# Patient Record
Sex: Male | Born: 1960 | Race: White | Hispanic: No | Marital: Married | Smoking: Never smoker
Health system: Southern US, Community
[De-identification: ages and names within clinical notes are randomized; demographics above are authoritative.]

## PROBLEM LIST (undated history)

## (undated) DIAGNOSIS — N200 Calculus of kidney: Secondary | ICD-10-CM

## (undated) DIAGNOSIS — E785 Hyperlipidemia, unspecified: Secondary | ICD-10-CM

## (undated) DIAGNOSIS — Z8614 Personal history of Methicillin resistant Staphylococcus aureus infection: Secondary | ICD-10-CM

## (undated) DIAGNOSIS — I509 Heart failure, unspecified: Secondary | ICD-10-CM

## (undated) DIAGNOSIS — I2510 Atherosclerotic heart disease of native coronary artery without angina pectoris: Secondary | ICD-10-CM

## (undated) DIAGNOSIS — G4730 Sleep apnea, unspecified: Secondary | ICD-10-CM

## (undated) DIAGNOSIS — E119 Type 2 diabetes mellitus without complications: Secondary | ICD-10-CM

## (undated) DIAGNOSIS — Z87442 Personal history of urinary calculi: Secondary | ICD-10-CM

## (undated) DIAGNOSIS — I10 Essential (primary) hypertension: Secondary | ICD-10-CM

## (undated) HISTORY — PX: CORONARY ANGIOPLASTY WITH STENT PLACEMENT: SHX49

## (undated) HISTORY — DX: Heart failure, unspecified: I50.9

---

## 1998-07-15 ENCOUNTER — Ambulatory Visit (HOSPITAL_COMMUNITY): Admission: RE | Admit: 1998-07-15 | Discharge: 1998-07-15 | Payer: Self-pay | Admitting: Obstetrics and Gynecology

## 1998-08-21 ENCOUNTER — Ambulatory Visit (HOSPITAL_COMMUNITY): Admission: RE | Admit: 1998-08-21 | Discharge: 1998-08-21 | Payer: Self-pay | Admitting: Obstetrics and Gynecology

## 2004-10-13 ENCOUNTER — Ambulatory Visit: Payer: Self-pay | Admitting: Unknown Physician Specialty

## 2007-04-17 ENCOUNTER — Emergency Department: Payer: Self-pay

## 2009-08-09 ENCOUNTER — Ambulatory Visit: Payer: Self-pay | Admitting: Rheumatology

## 2009-10-09 ENCOUNTER — Ambulatory Visit: Payer: Self-pay | Admitting: Anesthesiology

## 2009-10-31 ENCOUNTER — Encounter: Payer: Self-pay | Admitting: Anesthesiology

## 2009-11-19 ENCOUNTER — Encounter: Payer: Self-pay | Admitting: Anesthesiology

## 2010-06-30 ENCOUNTER — Ambulatory Visit: Payer: Self-pay | Admitting: Cardiology

## 2010-07-15 ENCOUNTER — Encounter: Payer: Self-pay | Admitting: Cardiology

## 2010-07-22 ENCOUNTER — Encounter: Payer: Self-pay | Admitting: Cardiology

## 2010-08-21 ENCOUNTER — Encounter: Payer: Self-pay | Admitting: Cardiology

## 2010-09-21 ENCOUNTER — Encounter: Payer: Self-pay | Admitting: Cardiology

## 2010-10-22 ENCOUNTER — Encounter: Payer: Self-pay | Admitting: Cardiology

## 2010-11-20 ENCOUNTER — Encounter: Payer: Self-pay | Admitting: Cardiology

## 2011-05-23 ENCOUNTER — Emergency Department: Payer: Self-pay | Admitting: Emergency Medicine

## 2012-05-30 ENCOUNTER — Other Ambulatory Visit: Payer: Self-pay | Admitting: Specialist

## 2012-05-30 DIAGNOSIS — M25512 Pain in left shoulder: Secondary | ICD-10-CM

## 2012-06-04 ENCOUNTER — Ambulatory Visit
Admission: RE | Admit: 2012-06-04 | Discharge: 2012-06-04 | Disposition: A | Discharge status: Home / Self Care | Payer: Worker's Compensation | Source: Ambulatory Visit | Attending: Specialist | Admitting: Specialist

## 2012-06-04 ENCOUNTER — Inpatient Hospital Stay: Admission: RE | Admit: 2012-06-04 | Payer: Self-pay | Source: Ambulatory Visit

## 2012-06-04 DIAGNOSIS — M25512 Pain in left shoulder: Secondary | ICD-10-CM

## 2012-07-14 ENCOUNTER — Ambulatory Visit: Payer: Self-pay | Admitting: Specialist

## 2012-07-28 ENCOUNTER — Ambulatory Visit: Payer: Self-pay | Admitting: Specialist

## 2012-08-24 ENCOUNTER — Ambulatory Visit: Payer: Self-pay | Admitting: Specialist

## 2012-11-12 ENCOUNTER — Observation Stay: Payer: Self-pay | Admitting: Surgery

## 2012-11-12 LAB — PROTIME-INR
INR: 1
Prothrombin Time: 13.5 secs (ref 11.5–14.7)

## 2012-11-12 LAB — MAGNESIUM: Magnesium: 1.5 mg/dL — ABNORMAL LOW

## 2012-11-12 LAB — COMPREHENSIVE METABOLIC PANEL
Albumin: 3.6 g/dL (ref 3.4–5.0)
Alkaline Phosphatase: 80 U/L (ref 50–136)
Calcium, Total: 9 mg/dL (ref 8.5–10.1)
Co2: 25 mmol/L (ref 21–32)
Creatinine: 0.88 mg/dL (ref 0.60–1.30)
Glucose: 276 mg/dL — ABNORMAL HIGH (ref 65–99)
Osmolality: 284 (ref 275–301)
Total Protein: 7 g/dL (ref 6.4–8.2)

## 2012-11-12 LAB — BASIC METABOLIC PANEL
Anion Gap: 7 (ref 7–16)
BUN: 16 mg/dL (ref 7–18)
Calcium, Total: 8.6 mg/dL (ref 8.5–10.1)
Co2: 25 mmol/L (ref 21–32)
Glucose: 223 mg/dL — ABNORMAL HIGH (ref 65–99)

## 2012-11-12 LAB — CBC
HGB: 13.4 g/dL (ref 13.0–18.0)
MCH: 30.5 pg (ref 26.0–34.0)
MCHC: 33.6 g/dL (ref 32.0–36.0)
MCV: 91 fL (ref 80–100)
Platelet: 222 10*3/uL (ref 150–440)
RBC: 4.38 10*6/uL — ABNORMAL LOW (ref 4.40–5.90)

## 2012-11-12 LAB — AMYLASE: Amylase: 29 U/L (ref 25–115)

## 2012-11-12 LAB — LIPASE, BLOOD: Lipase: 143 U/L (ref 73–393)

## 2012-11-13 LAB — COMPREHENSIVE METABOLIC PANEL
Alkaline Phosphatase: 75 U/L (ref 50–136)
BUN: 25 mg/dL — ABNORMAL HIGH (ref 7–18)
Chloride: 101 mmol/L (ref 98–107)
EGFR (African American): >60
EGFR (Non-African Amer.): >60
Potassium: 4.1 mmol/L (ref 3.5–5.1)
SGOT(AST): 51 U/L — ABNORMAL HIGH (ref 15–37)
SGPT (ALT): 53 U/L (ref 12–78)
Sodium: 137 mmol/L (ref 136–145)
Total Protein: 6.6 g/dL (ref 6.4–8.2)

## 2012-11-13 LAB — CBC WITH DIFFERENTIAL/PLATELET
Eosinophil #: 0.1 10*3/uL (ref 0.0–0.7)
Eosinophil %: 1 %
HGB: 12.2 g/dL — ABNORMAL LOW (ref 13.0–18.0)
Lymphocyte #: 2.9 10*3/uL (ref 1.0–3.6)
MCH: 31 pg (ref 26.0–34.0)
MCV: 92 fL (ref 80–100)
Monocyte #: 0.7 x10 3/mm (ref 0.2–1.0)
Monocyte %: 6 %
Neutrophil #: 7.9 10*3/uL — ABNORMAL HIGH (ref 1.4–6.5)
Neutrophil %: 67.5 %
Platelet: 202 10*3/uL (ref 150–440)
RBC: 3.94 10*6/uL — ABNORMAL LOW (ref 4.40–5.90)
RDW: 12.8 % (ref 11.5–14.5)
WBC: 11.8 10*3/uL — ABNORMAL HIGH (ref 3.8–10.6)

## 2012-11-24 ENCOUNTER — Ambulatory Visit: Payer: Self-pay | Admitting: Surgery

## 2013-03-30 ENCOUNTER — Ambulatory Visit: Payer: Self-pay | Admitting: Gastroenterology

## 2015-01-08 NOTE — Op Note (Signed)
PATIENT NAME:  Philip Cook, Philip Cook MR#:  [id number] DATE OF BIRTH:  29-Sep-1960  DATE OF PROCEDURE:  07/28/2012  PREOPERATIVE DIAGNOSIS: Right carpal tunnel syndrome.   POSTOPERATIVE DIAGNOSIS: Right carpal tunnel syndrome.   OPERATION: Right carpal tunnel release.   SURGEON: Park Breed, MD   ANESTHESIA: IV regional anesthesia.   COMPLICATIONS: None.   DRAINS: None.   ESTIMATED BLOOD LOSS: None.   REPLACEMENTS: None.   OPERATIVE PROCEDURE: The patient was brought to the operating room where he underwent satisfactory IV regional anesthesia in the right arm. The arm was prepped and draped in a sterile fashion. The tourniquet time was 37 minutes. A longitudinal incision was made in the palm to the ulnar side of midline. Dissection was carried out sharply through subcutaneous tissue under loupe magnification. Dissection was carried out through the palmar fascia to reveal the distal aspect of the volar carpal ligament. A Kelly clamp was passed beneath this to protect the nerve and the volar ligament was divided under direct vision with a mini blade knife. Carpal tunnel scissors were used to complete this proximally. The nerve was pale and flattened. It was freed up from adhesions with a mosquito clamp. The motor branch was intact. Ulnar artery and nerve were followed proximally and unroofed in the palm. The wounds were then irrigated and closed with running 4-0 nylon suture. 0.5% Marcaine was placed in the wound and a dry sterile compression hand dressing with volar splint was applied.     Tourniquet was deflated with good return of blood flow to the hand without problems. The patient was transferred to a stretcher and taken to recovery in good condition.   ____________________________ Park Breed, MD hem:drc D: 07/28/2012 08:41:20 ET T: 07/28/2012 11:56:58 ET JOB#: [id number]  cc: Park Breed, MD, <Dictator> Park Breed MD ELECTRONICALLY SIGNED 07/28/2012 18:39
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
## 2015-07-25 ENCOUNTER — Ambulatory Visit: Payer: BLUE CROSS/BLUE SHIELD | Attending: Specialist

## 2015-07-25 DIAGNOSIS — G4761 Periodic limb movement disorder: Secondary | ICD-10-CM | POA: Insufficient documentation

## 2015-07-25 DIAGNOSIS — G4733 Obstructive sleep apnea (adult) (pediatric): Secondary | ICD-10-CM | POA: Diagnosis present

## 2017-01-01 ENCOUNTER — Other Ambulatory Visit: Payer: Self-pay | Admitting: Internal Medicine

## 2017-01-01 DIAGNOSIS — R197 Diarrhea, unspecified: Secondary | ICD-10-CM

## 2017-01-14 ENCOUNTER — Ambulatory Visit
Admission: RE | Admit: 2017-01-14 | Discharge: 2017-01-14 | Disposition: A | Discharge status: Home / Self Care | Payer: BC Managed Care – PPO | Source: Ambulatory Visit | Attending: Internal Medicine | Admitting: Internal Medicine

## 2017-01-14 DIAGNOSIS — R197 Diarrhea, unspecified: Secondary | ICD-10-CM | POA: Diagnosis present

## 2017-01-14 DIAGNOSIS — K760 Fatty (change of) liver, not elsewhere classified: Secondary | ICD-10-CM | POA: Diagnosis not present

## 2017-04-05 ENCOUNTER — Encounter: Payer: Self-pay | Admitting: Emergency Medicine

## 2017-04-05 ENCOUNTER — Observation Stay
Admission: EM | Admit: 2017-04-05 | Discharge: 2017-04-07 | Disposition: A | Discharge status: Home / Self Care | Payer: BC Managed Care – PPO | Attending: Internal Medicine | Admitting: Internal Medicine

## 2017-04-05 ENCOUNTER — Emergency Department: Payer: BC Managed Care – PPO

## 2017-04-05 DIAGNOSIS — I2584 Coronary atherosclerosis due to calcified coronary lesion: Secondary | ICD-10-CM | POA: Insufficient documentation

## 2017-04-05 DIAGNOSIS — R609 Edema, unspecified: Secondary | ICD-10-CM

## 2017-04-05 DIAGNOSIS — E785 Hyperlipidemia, unspecified: Secondary | ICD-10-CM | POA: Insufficient documentation

## 2017-04-05 DIAGNOSIS — Z882 Allergy status to sulfonamides status: Secondary | ICD-10-CM | POA: Insufficient documentation

## 2017-04-05 DIAGNOSIS — G4733 Obstructive sleep apnea (adult) (pediatric): Secondary | ICD-10-CM | POA: Insufficient documentation

## 2017-04-05 DIAGNOSIS — I5031 Acute diastolic (congestive) heart failure: Secondary | ICD-10-CM | POA: Insufficient documentation

## 2017-04-05 DIAGNOSIS — I110 Hypertensive heart disease with heart failure: Principal | ICD-10-CM | POA: Insufficient documentation

## 2017-04-05 DIAGNOSIS — M7989 Other specified soft tissue disorders: Secondary | ICD-10-CM | POA: Diagnosis not present

## 2017-04-05 DIAGNOSIS — Z794 Long term (current) use of insulin: Secondary | ICD-10-CM | POA: Diagnosis not present

## 2017-04-05 DIAGNOSIS — Z9641 Presence of insulin pump (external) (internal): Secondary | ICD-10-CM | POA: Diagnosis not present

## 2017-04-05 DIAGNOSIS — R079 Chest pain, unspecified: Secondary | ICD-10-CM | POA: Diagnosis present

## 2017-04-05 DIAGNOSIS — Z8249 Family history of ischemic heart disease and other diseases of the circulatory system: Secondary | ICD-10-CM | POA: Diagnosis not present

## 2017-04-05 DIAGNOSIS — Z955 Presence of coronary angioplasty implant and graft: Secondary | ICD-10-CM | POA: Diagnosis not present

## 2017-04-05 DIAGNOSIS — Z7982 Long term (current) use of aspirin: Secondary | ICD-10-CM | POA: Diagnosis not present

## 2017-04-05 DIAGNOSIS — Z6841 Body Mass Index (BMI) 40.0 and over, adult: Secondary | ICD-10-CM | POA: Insufficient documentation

## 2017-04-05 DIAGNOSIS — I2510 Atherosclerotic heart disease of native coronary artery without angina pectoris: Secondary | ICD-10-CM | POA: Diagnosis not present

## 2017-04-05 HISTORY — DX: Type 2 diabetes mellitus without complications: E11.9

## 2017-04-05 HISTORY — DX: Essential (primary) hypertension: I10

## 2017-04-05 HISTORY — DX: Hyperlipidemia, unspecified: E78.5

## 2017-04-05 HISTORY — DX: Atherosclerotic heart disease of native coronary artery without angina pectoris: I25.10

## 2017-04-05 LAB — COMPREHENSIVE METABOLIC PANEL
ALT: 29 U/L (ref 17–63)
AST: 27 U/L (ref 15–41)
Albumin: 3.9 g/dL (ref 3.5–5.0)
Alkaline Phosphatase: 64 U/L (ref 38–126)
Anion gap: 9 (ref 5–15)
BILIRUBIN TOTAL: 0.6 mg/dL (ref 0.3–1.2)
BUN: 17 mg/dL (ref 6–20)
CO2: 26 mmol/L (ref 22–32)
CREATININE: 0.9 mg/dL (ref 0.61–1.24)
Calcium: 9.3 mg/dL (ref 8.9–10.3)
Chloride: 106 mmol/L (ref 101–111)
Glucose, Bld: 139 mg/dL — ABNORMAL HIGH (ref 65–99)
POTASSIUM: 4.3 mmol/L (ref 3.5–5.1)
Sodium: 141 mmol/L (ref 135–145)
TOTAL PROTEIN: 7.1 g/dL (ref 6.5–8.1)

## 2017-04-05 LAB — CBC
HCT: 36.8 % — ABNORMAL LOW (ref 40.0–52.0)
Hemoglobin: 12.4 g/dL — ABNORMAL LOW (ref 13.0–18.0)
MCH: 29.1 pg (ref 26.0–34.0)
MCHC: 33.7 g/dL (ref 32.0–36.0)
MCV: 86.4 fL (ref 80.0–100.0)
PLATELETS: 200 10*3/uL (ref 150–440)
RBC: 4.26 MIL/uL — AB (ref 4.40–5.90)
RDW: 14.6 % — AB (ref 11.5–14.5)
WBC: 10.7 10*3/uL — ABNORMAL HIGH (ref 3.8–10.6)

## 2017-04-05 LAB — TROPONIN I

## 2017-04-05 LAB — BRAIN NATRIURETIC PEPTIDE: B NATRIURETIC PEPTIDE 5: 92 pg/mL (ref 0.0–100.0)

## 2017-04-05 MED ORDER — ROSUVASTATIN CALCIUM 5 MG PO TABS
5.0000 mg | ORAL_TABLET | Freq: Every day | ORAL | Status: DC
  Administered 2017-04-06 – 2017-04-07 (×2): 5 mg via ORAL
  Filled 2017-04-05 (×2): qty 1

## 2017-04-05 MED ORDER — MELOXICAM 7.5 MG PO TABS
7.5000 mg | ORAL_TABLET | Freq: Every day | ORAL | Status: DC
  Administered 2017-04-05 – 2017-04-06 (×2): 7.5 mg via ORAL
  Filled 2017-04-05 (×3): qty 1

## 2017-04-05 MED ORDER — INSULIN PUMP
Freq: Three times a day (TID) | SUBCUTANEOUS | Status: DC
  Administered 2017-04-05: 22:00:00 via SUBCUTANEOUS
  Filled 2017-04-05: qty 1

## 2017-04-05 MED ORDER — ASPIRIN 81 MG PO CHEW
162.0000 mg | CHEWABLE_TABLET | Freq: Once | ORAL | Status: AC
  Administered 2017-04-05: 162 mg via ORAL
  Filled 2017-04-05: qty 2

## 2017-04-05 MED ORDER — FUROSEMIDE 10 MG/ML IJ SOLN
20.0000 mg | Freq: Two times a day (BID) | INTRAMUSCULAR | Status: DC
  Administered 2017-04-06: 20 mg via INTRAVENOUS
  Filled 2017-04-05: qty 2

## 2017-04-05 MED ORDER — ASPIRIN 325 MG PO TABS
325.0000 mg | ORAL_TABLET | Freq: Every day | ORAL | Status: DC
  Administered 2017-04-06 – 2017-04-07 (×2): 325 mg via ORAL
  Filled 2017-04-05 (×2): qty 1

## 2017-04-05 MED ORDER — PRASUGREL HCL 10 MG PO TABS
10.0000 mg | ORAL_TABLET | Freq: Every day | ORAL | Status: DC
  Administered 2017-04-06 – 2017-04-07 (×2): 10 mg via ORAL
  Filled 2017-04-05 (×2): qty 1

## 2017-04-05 MED ORDER — METFORMIN HCL ER 500 MG PO TB24: 2000.0000 mg | ORAL_TABLET | Freq: Every day | ORAL | Status: DC

## 2017-04-05 MED ORDER — METFORMIN HCL ER 500 MG PO TB24
1000.0000 mg | ORAL_TABLET | Freq: Two times a day (BID) | ORAL | Status: DC
  Administered 2017-04-06 (×2): 1000 mg via ORAL
  Filled 2017-04-05 (×4): qty 2

## 2017-04-05 MED ORDER — FUROSEMIDE 10 MG/ML IJ SOLN
20.0000 mg | Freq: Once | INTRAMUSCULAR | Status: AC
  Administered 2017-04-05: 20 mg via INTRAVENOUS
  Filled 2017-04-05: qty 4

## 2017-04-05 MED ORDER — ENOXAPARIN SODIUM 40 MG/0.4ML ~~LOC~~ SOLN
40.0000 mg | Freq: Two times a day (BID) | SUBCUTANEOUS | Status: DC
  Administered 2017-04-05 – 2017-04-06 (×3): 40 mg via SUBCUTANEOUS
  Filled 2017-04-05 (×3): qty 0.4

## 2017-04-05 MED ORDER — DOCUSATE SODIUM 100 MG PO CAPS: 100.0000 mg | ORAL_CAPSULE | Freq: Two times a day (BID) | ORAL | Status: DC | PRN

## 2017-04-05 MED ORDER — NEBIVOLOL HCL 10 MG PO TABS
10.0000 mg | ORAL_TABLET | Freq: Every day | ORAL | Status: DC
  Administered 2017-04-06 – 2017-04-07 (×2): 10 mg via ORAL
  Filled 2017-04-05 (×2): qty 1

## 2017-04-05 NOTE — H&P (Signed)
Philip Cook NAME: Philip Cook    MR#:  [id number]  DATE OF BIRTH:  15-Sep-1961  DATE OF ADMISSION:  04/05/2017  PRIMARY CARE PHYSICIAN: [name], MD   REQUESTING/REFERRING PHYSICIAN: Rifenbark  CHIEF COMPLAINT:   Chief Complaint  Patient presents with  . Shortness of Breath  . Chest Pain    HISTORY OF PRESENT ILLNESS: Philip Cook  is a 56 y.o. male with a known history of Coronary artery disease status post stent in 2011, diabetes, hyperlipidemia, hypertension- was feeling increasingly short of breath with minimal exertion for last few days. Today also started having some chest tightness just walking 100 steps from the parking lot to his workplace which is very unusual for him. He had to sit down and catch up his breathing, so he got worried about his heart and decided to come to emergency room. He denies any associated pain in arm. He had some swelling on his both legs for last few days. In emergency room he is noted to have some CHF and so given to hospitalist team for further management.  PAST MEDICAL HISTORY:   Past Medical History:  Diagnosis Date  . Coronary artery disease   . Diabetes mellitus without complication ([place])   . Hyperlipidemia   . Hypertension     PAST SURGICAL HISTORY: Past Surgical History:  Procedure Laterality Date  . CORONARY ANGIOPLASTY WITH STENT PLACEMENT      SOCIAL HISTORY:  Social History  Substance Use Topics  . Smoking status: Never Smoker  . Smokeless tobacco: Not on file  . Alcohol use No    FAMILY HISTORY:  Family History  Problem Relation Age of Onset  . CAD Father   . Heart failure Brother     DRUG ALLERGIES:  Allergies  Allergen Reactions  . Sulfa Antibiotics Rash    REVIEW OF SYSTEMS:   CONSTITUTIONAL: No fever, fatigue or weakness.  EYES: No blurred or double vision.  EARS, NOSE, AND THROAT: No tinnitus or ear pain.  RESPIRATORY: No cough,Positive for  shortness of breath, no wheezing or hemoptysis.  CARDIOVASCULAR: No chest pain, orthopnea, edema.  GASTROINTESTINAL: No nausea, vomiting, diarrhea or abdominal pain.  GENITOURINARY: No dysuria, hematuria.  ENDOCRINE: No polyuria, nocturia,  HEMATOLOGY: No anemia, easy bruising or bleeding SKIN: No rash or lesion. MUSCULOSKELETAL: No joint pain or arthritis.   NEUROLOGIC: No tingling, numbness, weakness.  PSYCHIATRY: No anxiety or depression.   MEDICATIONS AT HOME:  Prior to Admission medications   Medication Sig Start Date End Date Taking? Authorizing Provider  aspirin 325 MG tablet Take 325 mg by mouth daily.   Yes [provider]  Insulin Human (INSULIN PUMP) SOLN Inject into the skin.   Yes [provider]  meloxicam (MOBIC) 7.5 MG tablet Take 7.5 mg by mouth daily.   Yes [provider]  metFORMIN (GLUMETZA) 1000 MG (MOD) 24 hr tablet Take 2,000 mg by mouth daily with breakfast.   Yes [provider]  nebivolol (BYSTOLIC) 10 MG tablet Take 10 mg by mouth daily.   Yes [provider]  olmesartan-hydrochlorothiazide (BENICAR HCT) 40-25 MG tablet Take 1 tablet by mouth daily.   Yes [provider]  prasugrel (EFFIENT) 10 MG TABS tablet TAKE ONE TABLET BY MOUTH EVERY DAY 01/04/17  Yes [provider]  rosuvastatin (CRESTOR) 5 MG tablet Take 5 mg by mouth daily.   Yes [provider]      PHYSICAL EXAMINATION:  VITAL SIGNS: Blood pressure (!) 144/70, pulse 73, temperature 99 F (37.2 C), temperature source Oral, resp. rate 20, height 5\' 10"  (1.778 m), weight (!) 147.4 kg (325 lb), SpO2 97 %.  GENERAL:  56 y.o.-year-old obese patient lying in the bed with no acute distress.  EYES: Pupils equal, round, reactive to light and accommodation. No scleral icterus. Extraocular muscles intact.  HEENT: Head atraumatic, normocephalic. Oropharynx and nasopharynx clear.  NECK:  Supple, no jugular venous distention. No thyroid  enlargement, no tenderness.  LUNGS: Normal breath sounds bilaterally, no wheezing, some crepitation. No use of accessory muscles of respiration.  CARDIOVASCULAR: S1, S2 normal. No murmurs, rubs, or gallops.  ABDOMEN: Soft, nontender, nondistended. Bowel sounds present. No organomegaly or mass.  EXTREMITIES: No pedal edema, cyanosis, or clubbing.  NEUROLOGIC: Cranial nerves II through XII are intact. Muscle strength 5/5 in all extremities. Sensation intact. Gait not checked.  PSYCHIATRIC: The patient is alert and oriented x 3.  SKIN: No obvious rash, lesion, or ulcer.   LABORATORY PANEL:   CBC  Recent Labs Lab 04/05/17 1656  WBC 10.7*  HGB 12.4*  HCT 36.8*  PLT 200  MCV 86.4  MCH 29.1  MCHC 33.7  RDW 14.6*   ------------------------------------------------------------------------------------------------------------------  Chemistries   Recent Labs Lab 04/05/17 1656  NA 141  K 4.3  CL 106  CO2 26  GLUCOSE 139*  BUN 17  CREATININE 0.90  CALCIUM 9.3  AST 27  ALT 29  ALKPHOS 64  BILITOT 0.6   ------------------------------------------------------------------------------------------------------------------ estimated creatinine clearance is 136.4 mL/min (by C-G formula based on SCr of 0.9 mg/dL). ------------------------------------------------------------------------------------------------------------------ No results for input(s): TSH, T4TOTAL, T3FREE, THYROIDAB in the last 72 hours.  Invalid input(s): FREET3   Coagulation profile No results for input(s): INR, PROTIME in the last 168 hours. ------------------------------------------------------------------------------------------------------------------- No results for input(s): DDIMER in the last 72 hours. -------------------------------------------------------------------------------------------------------------------  Cardiac Enzymes  Recent Labs Lab 04/05/17 1656  TROPONINI <0.03    ------------------------------------------------------------------------------------------------------------------ Invalid input(s): POCBNP  ---------------------------------------------------------------------------------------------------------------  Urinalysis No results found for: COLORURINE, APPEARANCEUR, LABSPEC, PHURINE, GLUCOSEU, HGBUR, BILIRUBINUR, KETONESUR, PROTEINUR, UROBILINOGEN, NITRITE, LEUKOCYTESUR   RADIOLOGY: Dg Chest 2 View  Result Date: 04/05/2017 CLINICAL DATA:  Chest pressure and shortness of breath for the past week. Lower extremity swelling. EXAM: CHEST  2 VIEW COMPARISON:  11/24/2012. FINDINGS: Stable enlarged cardiac silhouette. Increased prominence of the pulmonary vasculature and interstitial markings. No pleural fluid. 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 IMPRESSION: Mild changes of congestive heart failure with stable cardiomegaly. Electronically Signed   By: Claudie Revering M.D.   On: 04/05/2017 17:37    EKG: Orders placed or performed during the hospital encounter of 04/05/17  . ED EKG within 10 minutes  . ED EKG within 10 minutes  . EKG 12-Lead  . EKG 12-Lead    IMPRESSION AND PLAN:  * Acute diastolic congestive heart failure   IV Lasix, intake and output measurement , fluid restriction, daily weight, follow-up chest x-ray.   He had a stress echo done with EF 55% and no ischemic findings in March 2018.   I will call cardiology consult but not order any further diagnostic workup at this time as recently was done as outpatient.  * Hypertension   Continue home medications except hydrochlorothiazide as he would be on Lasix.  * Diabetes   We will check his blood sugar 4 times daily and not giving any coverage but let him use his insulin pump as he is using at home.  * Hyperlipidemia  Continue rosuvastatin.  * Coronary artery disease   Continue aspirin, beta blocker,prasugrel.  All the records are reviewed and case discussed  with ED provider. Management plans discussed with the patient, family and they are in agreement.  CODE STATUS: full. Code Status History    This patient does not have a recorded code status. Please follow your organizational policy for patients in this situation.       TOTAL TIME TAKING CARE OF THIS PATIENT: 35 minutes.    Vaughan Basta M.D on 04/05/2017   Between 7am to 6pm - Pager - [phone]  After 6pm go to [email or web address] - password [email or web address] [facility]  Office  [phone]  CC: Primary care physician; [name], MD   Note: This dictation was prepared with Dragon dictation along with smaller phrase technology. Any transcriptional errors that result from this process are unintentional.

## 2017-04-05 NOTE — ED Triage Notes (Signed)
Patient is wearing insulin pump.

## 2017-04-05 NOTE — ED Triage Notes (Signed)
States has had SOB and chest pressure x 1 week with decreased exercise tolerance. States has taken on some fluid.

## 2017-04-05 NOTE — ED Provider Notes (Signed)
[facility] Emergency Department Provider Note  ____________________________________________   First MD Initiated Contact with Patient 04/05/17 1742     (approximate)  I have reviewed the triage vital signs and the nursing notes.   HISTORY  Chief Complaint Shortness of Breath and Chest Pain    HPI Philip Cook is a 56 y.o. male who self presents to the emergency department with several weeks of progressive exertional shortness of breath along with moderate severity pressure-like exertional chest discomfort. This chest pain and shortness of breath or improved with rest. He does have a past medical history of diabetes mellitus, hypertension, and morbid obesity.He denies history of DVT or pulmonary embolism. She does have bilateral lower extremity swelling that is equal. He has a previous stent and reports compliance with his medications.   Past Medical History:  Diagnosis Date  . Diabetes mellitus without complication ([place])   . Hypertension     There are no active problems to display for this patient.   Past Surgical History:  Procedure Laterality Date  . CORONARY ANGIOPLASTY WITH STENT PLACEMENT      Prior to Admission medications   Not on File    Allergies Sulfa antibiotics  No family history on file.  Social History Social History  Substance Use Topics  . Smoking status: Never Smoker  . Smokeless tobacco: Not on file  . Alcohol use Not on file    Review of Systems Constitutional: No fever/chills Eyes: No visual changes. ENT: No sore throat. Cardiovascular: Positive chest pain. Respiratory: Positive shortness of breath. Gastrointestinal: No abdominal pain.  No nausea, no vomiting.  No diarrhea.  No constipation. Genitourinary: Negative for dysuria. Musculoskeletal: Negative for back pain. Skin: Negative for rash. Neurological: Negative for headaches, focal weakness or  numbness.   ____________________________________________   PHYSICAL EXAM:  VITAL SIGNS: ED Triage Vitals  Enc Vitals Group     BP 04/05/17 1655 (!) 144/70     Pulse Rate 04/05/17 1655 73     Resp 04/05/17 1655 20     Temp 04/05/17 1655 99 F (37.2 C)     Temp Source 04/05/17 1655 Oral     SpO2 04/05/17 1655 97 %     Weight 04/05/17 1656 (!) 325 lb (147.4 kg)     Height 04/05/17 1656 5\' 10"  (1.778 m)     Head Circumference --      Peak Flow --      Pain Score 04/05/17 1655 6     Pain Loc --      Pain Edu? --      Excl. in [place]? --     Constitutional: Alert and oriented 4 clearly out of breath while sitting up into his chair appears morbidly obese Eyes: PERRL EOMI. Head: Atraumatic. Nose: No congestion/rhinnorhea. Mouth/Throat: No trismus Neck: No stridor.  Able to lie completely flat but with some JVD Cardiovascular: Normal rate, regular rhythm. Grossly normal heart sounds.  Good peripheral circulation. Respiratory: Increased respiratory effort.  No retractions. Lungs CTAB and moving good air Gastrointestinal: Obese soft nontender Musculoskeletal: Legs are equal in size Neurologic:  Normal speech and language. No gross focal neurologic deficits are appreciated. Skin:  Skin is warm, dry and intact. No rash noted. Psychiatric: Mood and affect are normal. Speech and behavior are normal.    ____________________________________________   DIFFERENTIAL includes but not limited to  Acute coronary syndrome, pulmonary embolism, pneumothorax, aortic dissection, Boerhaave syndrome ____________________________________________   LABS (all labs ordered are listed, but  only abnormal results are displayed)  Labs Reviewed  CBC - Abnormal; Notable for the following:       Result Value   WBC 10.7 (*)    RBC 4.26 (*)    Hemoglobin 12.4 (*)    HCT 36.8 (*)    RDW 14.6 (*)    All other components within normal limits  COMPREHENSIVE METABOLIC PANEL - Abnormal; Notable for the  following:    Glucose, Bld 139 (*)    All other components within normal limits  TROPONIN I  BRAIN NATRIURETIC PEPTIDE  TROPONIN I    No signs of acute ischemia 1 __________________________________________  EKG  ED ECG REPORT I, Darel Hong, the attending physician, personally viewed and interpreted this ECG.  Date: 04/05/2017 Rate: 75 Rhythm: normal sinus rhythm QRS Axis: normal Intervals: normal ST/T Wave abnormalities: normal Narrative Interpretation: Incomplete right bundle branch block but otherwise unremarkable  ____________________________________________  RADIOLOGY  Chest x-ray concerning for mild fluid overload secondary to congestive heart failure ____________________________________________   PROCEDURES  Procedure(s) performed: no  Procedures  Critical Care performed: no  Observation:no ____________________________________________   INITIAL IMPRESSION / ASSESSMENT AND PLAN / ED COURSE  Pertinent labs & imaging results that were available during my care of the patient were reviewed by me and considered in my medical decision making (see chart for details).  While the patient's EKG is not a STEMI and his first troponin is negative his story of exertional chest pain and shortness of breath with a known cardiac history is concerning. He has a HEART score of 4 which requires inpatient admission for cardiac rule out.  While he does have worsening BLE swelling, his weight is stable from previous.  Unclear if he is significantly fluid overloaded.   ____________________________________________   FINAL CLINICAL IMPRESSION(S) / ED DIAGNOSES  Final diagnoses:  Chest pain, unspecified type      NEW MEDICATIONS STARTED DURING THIS VISIT:  New Prescriptions   No medications on file     Note:  This document was prepared using Dragon voice recognition software and may include unintentional dictation errors.     Darel Hong, MD 04/06/17  1451

## 2017-04-06 ENCOUNTER — Observation Stay: Payer: BC Managed Care – PPO

## 2017-04-06 LAB — GLUCOSE, CAPILLARY
GLUCOSE-CAPILLARY: 98 mg/dL (ref 65–99)
GLUCOSE-CAPILLARY: 73 mg/dL (ref 65–99)
GLUCOSE-CAPILLARY: 79 mg/dL (ref 65–99)
Glucose-Capillary: 102 mg/dL — ABNORMAL HIGH (ref 65–99)
Glucose-Capillary: 110 mg/dL — ABNORMAL HIGH (ref 65–99)
Glucose-Capillary: 56 mg/dL — ABNORMAL LOW (ref 65–99)
Glucose-Capillary: 91 mg/dL (ref 65–99)

## 2017-04-06 LAB — CBC
HCT: 36.6 % — ABNORMAL LOW (ref 40.0–52.0)
HEMOGLOBIN: 12.3 g/dL — AB (ref 13.0–18.0)
MCH: 29.2 pg (ref 26.0–34.0)
MCHC: 33.5 g/dL (ref 32.0–36.0)
MCV: 87.1 fL (ref 80.0–100.0)
Platelets: 197 10*3/uL (ref 150–440)
RBC: 4.21 MIL/uL — AB (ref 4.40–5.90)
RDW: 14.5 % (ref 11.5–14.5)
WBC: 10.5 10*3/uL (ref 3.8–10.6)

## 2017-04-06 LAB — BASIC METABOLIC PANEL
ANION GAP: 7 (ref 5–15)
BUN: 16 mg/dL (ref 6–20)
CALCIUM: 8.8 mg/dL — AB (ref 8.9–10.3)
CO2: 27 mmol/L (ref 22–32)
Chloride: 107 mmol/L (ref 101–111)
Creatinine, Ser: 0.82 mg/dL (ref 0.61–1.24)
GFR calc non Af Amer: >60 mL/min (ref >60)
Glucose, Bld: 124 mg/dL — ABNORMAL HIGH (ref 65–99)
Potassium: 3.4 mmol/L — ABNORMAL LOW (ref 3.5–5.1)
SODIUM: 141 mmol/L (ref 135–145)

## 2017-04-06 LAB — TROPONIN I: Troponin I: <0.03 ng/mL (ref <0.03)

## 2017-04-06 MED ORDER — ACETAMINOPHEN 325 MG PO TABS
650.0000 mg | ORAL_TABLET | Freq: Four times a day (QID) | ORAL | Status: DC | PRN
  Administered 2017-04-06 – 2017-04-07 (×2): 650 mg via ORAL
  Filled 2017-04-06 (×2): qty 2

## 2017-04-06 MED ORDER — INSULIN PUMP
Freq: Three times a day (TID) | SUBCUTANEOUS | Status: DC
  Administered 2017-04-06: 8.5 via SUBCUTANEOUS
  Administered 2017-04-06: 12.5 via SUBCUTANEOUS
  Administered 2017-04-06: 9.8 via SUBCUTANEOUS
  Filled 2017-04-06: qty 1

## 2017-04-06 MED ORDER — FUROSEMIDE 10 MG/ML IJ SOLN
40.0000 mg | Freq: Two times a day (BID) | INTRAMUSCULAR | Status: DC
  Administered 2017-04-06 – 2017-04-07 (×2): 40 mg via INTRAVENOUS
  Filled 2017-04-06 (×2): qty 4

## 2017-04-06 NOTE — Consult Note (Signed)
Reason for Consult:unstable angina congestive heart failure Referring Physician: Georgie Chard MD primary, Dr Anselm Jungling hospitalist  Philip [name] is an 56 y.o. male.  HPI: patient is a 56 year old morbidly obese white male known coronary disease history of PCI and stent 2011. Patient has history of diabetes hyperlipidemia hypertension obstructive sleep apnea states to have had recent dyspnea on minimal exertion fatigue tiredness and is whether this may be an anginal equivalency came to the hospital for further assessment. Patient recently lost his brother sudden death thought to be a cardiac event.patient's had shortness of breath dyspnea leg edema now presents for further cardiac evaluation.  Past Medical History:  Diagnosis Date  . Coronary artery disease   . Diabetes mellitus without complication ([place])   . Hyperlipidemia   . Hypertension     Past Surgical History:  Procedure Laterality Date  . CORONARY ANGIOPLASTY WITH STENT PLACEMENT      Family History  Problem Relation Age of Onset  . CAD Father   . Heart failure Brother     Social History:  reports that he has never smoked. He has never used smokeless tobacco. He reports that he does not drink alcohol or use drugs.  Allergies:  Allergies  Allergen Reactions  . Sulfa Antibiotics Rash    Medications: I have reviewed the patient's current medications.  Results for orders placed or performed during the hospital encounter of 04/05/17 (from the past 48 hour(s))  CBC     Status: Abnormal   Collection Time: 04/05/17  4:56 PM  Result Value Ref Range   WBC 10.7 (H) 3.8 - 10.6 K/uL   RBC 4.26 (L) 4.40 - 5.90 MIL/uL   Hemoglobin 12.4 (L) 13.0 - 18.0 g/dL   HCT 36.8 (L) 40.0 - 52.0 %   MCV 86.4 80.0 - 100.0 fL   MCH 29.1 26.0 - 34.0 pg   MCHC 33.7 32.0 - 36.0 g/dL   RDW 14.6 (H) 11.5 - 14.5 %   Platelets 200 150 - 440 K/uL  Troponin I     Status: None   Collection Time: 04/05/17  4:56 PM  Result Value Ref Range    Troponin I <0.03 <0.03 ng/mL  Comprehensive metabolic panel     Status: Abnormal   Collection Time: 04/05/17  4:56 PM  Result Value Ref Range   Sodium 141 135 - 145 mmol/L   Potassium 4.3 3.5 - 5.1 mmol/L   Chloride 106 101 - 111 mmol/L   CO2 26 22 - 32 mmol/L   Glucose, Bld 139 (H) 65 - 99 mg/dL   BUN 17 6 - 20 mg/dL   Creatinine, Ser 0.90 0.61 - 1.24 mg/dL   Calcium 9.3 8.9 - 10.3 mg/dL   Total Protein 7.1 6.5 - 8.1 g/dL   Albumin 3.9 3.5 - 5.0 g/dL   AST 27 15 - 41 U/L   ALT 29 17 - 63 U/L   Alkaline Phosphatase 64 38 - 126 U/L   Total Bilirubin 0.6 0.3 - 1.2 mg/dL   GFR calc non Af Amer >60 >60 mL/min   GFR calc Af Amer >60 >60 mL/min    Comment: (NOTE) The eGFR has been calculated using the CKD EPI equation. This calculation has not been validated in all clinical situations. eGFR's persistently <60 mL/min signify possible Chronic Kidney Disease.    Anion gap 9 5 - 15  Brain natriuretic peptide     Status: None   Collection Time: 04/05/17  4:56 PM  Result Value Ref  Range   B Natriuretic Peptide 92.0 0.0 - 100.0 pg/mL  Glucose, capillary     Status: None   Collection Time: 04/05/17  9:11 PM  Result Value Ref Range   Glucose-Capillary 98 65 - 99 mg/dL  Troponin I     Status: None   Collection Time: 04/05/17  9:30 PM  Result Value Ref Range   Troponin I <0.03 <0.03 ng/mL  Troponin I     Status: None   Collection Time: 04/06/17  1:26 AM  Result Value Ref Range   Troponin I <0.03 <0.03 ng/mL  Basic metabolic panel     Status: Abnormal   Collection Time: 04/06/17  1:32 AM  Result Value Ref Range   Sodium 141 135 - 145 mmol/L   Potassium 3.4 (L) 3.5 - 5.1 mmol/L   Chloride 107 101 - 111 mmol/L   CO2 27 22 - 32 mmol/L   Glucose, Bld 124 (H) 65 - 99 mg/dL   BUN 16 6 - 20 mg/dL   Creatinine, Ser 0.82 0.61 - 1.24 mg/dL   Calcium 8.8 (L) 8.9 - 10.3 mg/dL   GFR calc non Af Amer >60 >60 mL/min   GFR calc Af Amer >60 >60 mL/min    Comment: (NOTE) The eGFR has been  calculated using the CKD EPI equation. This calculation has not been validated in all clinical situations. eGFR's persistently <60 mL/min signify possible Chronic Kidney Disease.    Anion gap 7 5 - 15  CBC     Status: Abnormal   Collection Time: 04/06/17  1:32 AM  Result Value Ref Range   WBC 10.5 3.8 - 10.6 K/uL   RBC 4.21 (L) 4.40 - 5.90 MIL/uL   Hemoglobin 12.3 (L) 13.0 - 18.0 g/dL   HCT 36.6 (L) 40.0 - 52.0 %   MCV 87.1 80.0 - 100.0 fL   MCH 29.2 26.0 - 34.0 pg   MCHC 33.5 32.0 - 36.0 g/dL   RDW 14.5 11.5 - 14.5 %   Platelets 197 150 - 440 K/uL  Glucose, capillary     Status: Abnormal   Collection Time: 04/06/17  4:22 AM  Result Value Ref Range   Glucose-Capillary 102 (H) 65 - 99 mg/dL  Glucose, capillary     Status: None   Collection Time: 04/06/17  7:56 AM  Result Value Ref Range   Glucose-Capillary 73 65 - 99 mg/dL  Glucose, capillary     Status: None   Collection Time: 04/06/17 12:13 PM  Result Value Ref Range   Glucose-Capillary 79 65 - 99 mg/dL  Glucose, capillary     Status: Abnormal   Collection Time: 04/06/17  4:42 PM  Result Value Ref Range   Glucose-Capillary 110 (H) 65 - 99 mg/dL    Dg Chest 2 View  Result Date: 04/06/2017 CLINICAL DATA:  Shortness of breath with chest pain EXAM: CHEST  2 VIEW COMPARISON:  April 05, 2017 and November 24, 2012 FINDINGS: There is persistent interstitial edema without airspace consolidation or volume loss. There is an equivocal right pleural effusion. Heart is borderline enlarged with slight pulmonary venous hypertension. No adenopathy. 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 IMPRESSION: Evidence of a degree of congestive heart failure. No airspace consolidation. Stable cardiac silhouette. Electronically Signed   By: Lowella Grip III M.D.   On: 04/06/2017 08:39   Dg Chest 2 View  Result Date: 04/05/2017 CLINICAL DATA:  Chest pressure and shortness of breath for the past week. Lower extremity  swelling. EXAM: CHEST  2 VIEW COMPARISON:  11/24/2012. FINDINGS: Stable enlarged cardiac silhouette. Increased prominence of the pulmonary vasculature and interstitial markings. No pleural fluid. 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 IMPRESSION: Mild changes of congestive heart failure with stable cardiomegaly. Electronically Signed   By: Claudie Revering M.D.   On: 04/05/2017 17:37   US Venous Img Lower Bilateral  Result Date: 04/06/2017 CLINICAL DATA:  Lower extremity edema bilaterally EXAM: BILATERAL LOWER EXTREMITY VENOUS DUPLEX ULTRASOUND TECHNIQUE: Gray-scale sonography with graded compression, as well as color Doppler and duplex ultrasound were performed to evaluate the lower extremity deep venous systems from the level of the common femoral vein and including the common femoral, femoral, profunda femoral, popliteal and calf veins including the posterior tibial, peroneal and gastrocnemius veins when visible. The superficial great saphenous vein was also interrogated. Spectral Doppler was utilized to evaluate flow at rest and with distal augmentation maneuvers in the common femoral, femoral and popliteal veins. COMPARISON:  None. FINDINGS: RIGHT LOWER EXTREMITY Common Femoral Vein: No evidence of thrombus. Normal compressibility, respiratory phasicity and response to augmentation. Saphenofemoral Junction: No evidence of thrombus. Normal compressibility and flow on color Doppler imaging. Profunda Femoral Vein: No evidence of thrombus. Normal compressibility and flow on color Doppler imaging. Femoral Vein: No evidence of thrombus. Normal compressibility, respiratory phasicity and response to augmentation. Popliteal Vein: No evidence of thrombus. Normal compressibility, respiratory phasicity and response to augmentation. Calf Veins: No evidence of thrombus. Normal compressibility and flow on color Doppler imaging. Superficial Great Saphenous Vein: No evidence of thrombus. Normal  compressibility and flow on color Doppler imaging. Venous Reflux:  None. Other Findings:  None. LEFT LOWER EXTREMITY Common Femoral Vein: No evidence of thrombus. Normal compressibility, respiratory phasicity and response to augmentation. Saphenofemoral Junction: No evidence of thrombus. Normal compressibility and flow on color Doppler imaging. Profunda Femoral Vein: No evidence of thrombus. Normal compressibility and flow on color Doppler imaging. Femoral Vein: No evidence of thrombus. Normal compressibility, respiratory phasicity and response to augmentation. Popliteal Vein: No evidence of thrombus. Normal compressibility, respiratory phasicity and response to augmentation. Calf Veins: No evidence of thrombus. Normal compressibility and flow on color Doppler imaging. Superficial Great Saphenous Vein: No evidence of thrombus. Normal compressibility and flow on color Doppler imaging. Venous Reflux:  None. Other Findings:  None. IMPRESSION: No evidence of deep venous thrombosis within either lower extremity. Electronically Signed   By: Lowella Grip III M.D.   On: 04/06/2017 11:58    Review of Systems  Constitutional: Positive for diaphoresis and malaise/fatigue.  HENT: Positive for congestion.   Eyes: Negative.   Respiratory: Positive for shortness of breath.   Cardiovascular: Positive for chest pain and leg swelling.  Gastrointestinal: Negative.   Genitourinary: Negative.   Skin: Negative.   Neurological: Positive for weakness.  Endo/Heme/Allergies: Negative.   Psychiatric/Behavioral: Negative.    Blood pressure 107/63, pulse 64, temperature (!) 97.5 F (36.4 C), temperature source Oral, resp. rate 20, height 5' 10"  (1.778 m), weight (!) 148.4 kg (327 lb 3.2 oz), SpO2 96 %. Physical Exam  Assessment/Plan: Unstable angina Coronary artery disease Obesity History of PCI and stent Shortness of breath Diabetes Hyperlipidemia Congestive heart failure diastolic dysfunction Obstructive  Sleep Apnea . PLAN Agree with admit for rule out for myocarinfarction Continue short-term anticoagulation Follow-up EKGs Follow-up troponins Continue diabetes management Maintain lipid management Continue Effient s/p stent Hypertension control with Benicar HCTZ Bystolic Echocardiogram maybe helpful for Assessment of LV function Diuretic therapy for heart failure Supplemental  oxygen as he had Recommend cardiac catheter prior to discharge .  Philip [name] 04/06/2017, 5:30 PM

## 2017-04-06 NOTE — Progress Notes (Signed)
[place] at [facility]                                                                                                                                                                                  Patient Demographics   Philip [name], is a 56 y.o. male, DOB - [birth year], CBS:[id number]  Admit date - 04/05/2017   Admitting Physician Vaughan Basta, MD  Outpatient Primary MD for the patient is [name], MD   LOS - 0  Subjective: Patient feels better but still has significant amount of swelling in his lower extremity. Breathing improved    Review of Systems:   CONSTITUTIONAL: No documented fever. No fatigue, weakness. No weight gain, no weight loss.  EYES: No blurry or double vision.  ENT: No tinnitus. No postnasal drip. No redness of the oropharynx.  RESPIRATORY: No cough, no wheeze, no hemoptysis. Positive dyspnea.  CARDIOVASCULAR: No chest pain. No orthopnea. No palpitations. No syncope.  GASTROINTESTINAL: No nausea, no vomiting or diarrhea. No abdominal pain. No melena or hematochezia.  GENITOURINARY: No dysuria or hematuria.  ENDOCRINE: No polyuria or nocturia. No heat or cold intolerance.  HEMATOLOGY: No anemia. No bruising. No bleeding.  INTEGUMENTARY: No rashes. No lesions.  MUSCULOSKELETAL: No arthritis. Positive swelling. No gout.  NEUROLOGIC: No numbness, tingling, or ataxia. No seizure-type activity.  PSYCHIATRIC: No anxiety. No insomnia. No ADD.    Vitals:   Vitals:   04/05/17 1830 04/05/17 2109 04/06/17 0420 04/06/17 0752  BP: (!) 166/90 136/67 139/65 107/63  Pulse: 72 70 72 64  Resp: 18 18 16 20   Temp:  98.6 F (37 C) 98 F (36.7 C) (!) 97.5 F (36.4 C)  TempSrc:  Oral Oral Oral  SpO2: 97% 98% 93% 96%  Weight:  (!) 328 lb 4.8 oz (148.9 kg) (!) 327 lb 3.2 oz (148.4 kg)   Height:  5\' 10"  (1.778 m)      Wt Readings from Last 3 Encounters:  04/06/17 (!) 327 lb 3.2 oz (148.4 kg)     Intake/Output Summary  (Last 24 hours) at 04/06/17 1502 Last data filed at 04/06/17 1354  Gross per 24 hour  Intake              600 ml  Output             1500 ml  Net             -900 ml    Physical Exam:   GENERAL: Pleasant-appearing in no apparent distress.  HEAD, EYES, EARS, NOSE AND THROAT: Atraumatic, normocephalic. Extraocular muscles are intact. Pupils equal and reactive to light. Sclerae anicteric. No conjunctival injection. No  oro-pharyngeal erythema.  NECK: Supple. There is no jugular venous distention. No bruits, no lymphadenopathy, no thyromegaly.  HEART: Regular rate and rhythm,. No murmurs, no rubs, no clicks.  LUNGS: Clear to auscultation bilaterally. No rales or rhonchi. No wheezes.  ABDOMEN: Soft, flat, nontender, nondistended. Has good bowel sounds. No hepatosplenomegaly appreciated.  EXTREMITIES: Bilateral lower extremity edema  NEUROLOGIC: The patient is alert, awake, and oriented x3 with no focal motor or sensory deficits appreciated bilaterally.  SKIN: Moist and warm with no rashes appreciated.  Psych: Not anxious, depressed LN: No inguinal LN enlargement    Antibiotics   Anti-infectives    None      Medications   Scheduled Meds: . aspirin  325 mg Oral Daily  . enoxaparin (LOVENOX) injection  40 mg Subcutaneous BID  . furosemide  40 mg Intravenous Q12H  . insulin pump   Subcutaneous TID AC, HS, 0200  . meloxicam  7.5 mg Oral Daily  . metFORMIN  1,000 mg Oral BID WC  . nebivolol  10 mg Oral Daily  . prasugrel  10 mg Oral Daily  . rosuvastatin  5 mg Oral Daily   Continuous Infusions: PRN Meds:.docusate sodium   Data Review:   Micro Results No results found for this or any previous visit (from the past 240 hour(s)).  Radiology Reports Dg Chest 2 View  Result Date: 04/06/2017 CLINICAL DATA:  Shortness of breath with chest pain EXAM: CHEST  2 VIEW COMPARISON:  April 05, 2017 and November 24, 2012 FINDINGS: There is persistent interstitial edema without airspace  consolidation or volume loss. There is an equivocal right pleural effusion. Heart is borderline enlarged with slight pulmonary venous hypertension. No adenopathy. 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 IMPRESSION: Evidence of a degree of congestive heart failure. No airspace consolidation. Stable cardiac silhouette. Electronically Signed   By: Lowella Grip III M.D.   On: 04/06/2017 08:39   Dg Chest 2 View  Result Date: 04/05/2017 CLINICAL DATA:  Chest pressure and shortness of breath for the past week. Lower extremity swelling. EXAM: CHEST  2 VIEW COMPARISON:  11/24/2012. FINDINGS: Stable enlarged cardiac silhouette. Increased prominence of the pulmonary vasculature and interstitial markings. No pleural fluid. 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 IMPRESSION: Mild changes of congestive heart failure with stable cardiomegaly. Electronically Signed   By: Claudie Revering M.D.   On: 04/05/2017 17:37   US Venous Img Lower Bilateral  Result Date: 04/06/2017 CLINICAL DATA:  Lower extremity edema bilaterally EXAM: BILATERAL LOWER EXTREMITY VENOUS DUPLEX ULTRASOUND TECHNIQUE: Gray-scale sonography with graded compression, as well as color Doppler and duplex ultrasound were performed to evaluate the lower extremity deep venous systems from the level of the common femoral vein and including the common femoral, femoral, profunda femoral, popliteal and calf veins including the posterior tibial, peroneal and gastrocnemius veins when visible. The superficial great saphenous vein was also interrogated. Spectral Doppler was utilized to evaluate flow at rest and with distal augmentation maneuvers in the common femoral, femoral and popliteal veins. COMPARISON:  None. FINDINGS: RIGHT LOWER EXTREMITY Common Femoral Vein: No evidence of thrombus. Normal compressibility, respiratory phasicity and response to augmentation. Saphenofemoral Junction: No evidence of thrombus.  Normal compressibility and flow on color Doppler imaging. Profunda Femoral Vein: No evidence of thrombus. Normal compressibility and flow on color Doppler imaging. Femoral Vein: No evidence of thrombus. Normal compressibility, respiratory phasicity and response to augmentation. Popliteal Vein: No evidence of thrombus. Normal compressibility, respiratory phasicity and response  to augmentation. Calf Veins: No evidence of thrombus. Normal compressibility and flow on color Doppler imaging. Superficial Great Saphenous Vein: No evidence of thrombus. Normal compressibility and flow on color Doppler imaging. Venous Reflux:  None. Other Findings:  None. LEFT LOWER EXTREMITY Common Femoral Vein: No evidence of thrombus. Normal compressibility, respiratory phasicity and response to augmentation. Saphenofemoral Junction: No evidence of thrombus. Normal compressibility and flow on color Doppler imaging. Profunda Femoral Vein: No evidence of thrombus. Normal compressibility and flow on color Doppler imaging. Femoral Vein: No evidence of thrombus. Normal compressibility, respiratory phasicity and response to augmentation. Popliteal Vein: No evidence of thrombus. Normal compressibility, respiratory phasicity and response to augmentation. Calf Veins: No evidence of thrombus. Normal compressibility and flow on color Doppler imaging. Superficial Great Saphenous Vein: No evidence of thrombus. Normal compressibility and flow on color Doppler imaging. Venous Reflux:  None. Other Findings:  None. IMPRESSION: No evidence of deep venous thrombosis within either lower extremity. Electronically Signed   By: Lowella Grip III M.D.   On: 04/06/2017 11:58     CBC  Recent Labs Lab 04/05/17 1656 04/06/17 0132  WBC 10.7* 10.5  HGB 12.4* 12.3*  HCT 36.8* 36.6*  PLT 200 197  MCV 86.4 87.1  MCH 29.1 29.2  MCHC 33.7 33.5  RDW 14.6* 14.5    Chemistries   Recent Labs Lab 04/05/17 1656 04/06/17 0132  NA 141 141  K 4.3 3.4*   CL 106 107  CO2 26 27  GLUCOSE 139* 124*  BUN 17 16  CREATININE 0.90 0.82  CALCIUM 9.3 8.8*  AST 27  --   ALT 29  --   ALKPHOS 64  --   BILITOT 0.6  --    ------------------------------------------------------------------------------------------------------------------ estimated creatinine clearance is 150.3 mL/min (by C-G formula based on SCr of 0.82 mg/dL). ------------------------------------------------------------------------------------------------------------------ No results for input(s): HGBA1C in the last 72 hours. ------------------------------------------------------------------------------------------------------------------ No results for input(s): CHOL, HDL, LDLCALC, TRIG, CHOLHDL, LDLDIRECT in the last 72 hours. ------------------------------------------------------------------------------------------------------------------ No results for input(s): TSH, T4TOTAL, T3FREE, THYROIDAB in the last 72 hours.  Invalid input(s): FREET3 ------------------------------------------------------------------------------------------------------------------ No results for input(s): VITAMINB12, FOLATE, FERRITIN, TIBC, IRON, RETICCTPCT in the last 72 hours.  Coagulation profile No results for input(s): INR, PROTIME in the last 168 hours.  No results for input(s): DDIMER in the last 72 hours.  Cardiac Enzymes  Recent Labs Lab 04/05/17 1656 04/05/17 2130 04/06/17 0126  TROPONINI <0.03 <0.03 <0.03   ------------------------------------------------------------------------------------------------------------------ Invalid input(s): POCBNP    Assessment & Plan   * Acute diastolic congestive heart failure Still has significant swelling of the lower extremity continue IV Lasix I will up the dose   * Hypertension   Continue home medications , bystolic  * Diabetes   SSI with close stable  * Hyperlipidemia   Continue rosuvastatin.  * Coronary artery disease    Continue aspirin, beta blocker,prasugrel.     Code Status Orders        Start     Ordered   04/05/17 2053  Full code  Continuous     04/05/17 2052    Code Status History    Date Active Date Inactive Code Status Order ID Comments User Context   This patient has a current code status but no historical code status.           Consults cards  DVT Prophylaxis  Lovenox   Lab Results  Component Value Date   PLT 197 04/06/2017     Time Spent in minutes  52min Greater than 50% of time spent in care coordination and counseling patient regarding the condition and plan of care.   Dustin Flock M.D on 04/06/2017 at 3:02 PM  Between 7am to 6pm - Pager - [phone]  After 6pm go to [email or web address] - password [email or web address] [facility]   Office  [phone]

## 2017-04-06 NOTE — Progress Notes (Signed)
Inpatient Diabetes Program Recommendations  AACE/ADA: New Consensus Statement on Inpatient Glycemic Control (2015)  Target Ranges:  Prepandial:   less than 140 mg/dL      Peak postprandial:   less than 180 mg/dL (1-2 hours)      Critically ill patients:  140 - 180 mg/dL   Lab Results  Component Value Date   GLUCAP 102 (H) 04/06/2017    Review of Glycemic Control  Results for Philip, [name] (MRN [id number]) as of 04/06/2017 07:35  Ref. Range 04/06/2017 04:22  Glucose-Capillary Latest Ref Range: 65 - 99 mg/dL 102 (H)    Diabetes history: Type 2  Outpatient Diabetes medications: From Dr. Joycie Peek note dated 5//1/18.    56 y.o. male seen in follow-up for type 2 diabetes. He has a Medtronic MiniMed 530G insulin pump and an Enlite continuous glucose monitor (CGM). Current Hgb A1c is 7.6%. Diabetes is controlled. He uses Humulin R U-500 insulin. The pump and CGM were downloaded and reviewed.  Basal settings 12 am 0.65 units/hr 5 am 1.4 units/hr  12 pm 0.65 units/hr 5 pm 1.0 units/hr 24-hour basal = 23.3 "syringe units" of U-500 insulin.  Bolus settings IC ratio 12am 1:9.2, 4 pm 1:8 Sensitivity 42 Target 100-110 Active insulin time 5 hours   Average BG checks per day = 3. Average BG over two weeks = 178 on glucometer and 175 on sensor. Average carbs entered into the pump daily = 261 g. Average total daily use of insulin = 57 syringe units of U500 insulin (basal 40%, bolus 60%). Changes site and fills canula q3 days. Average bolus per day = 3.7. Overrides 6% of boluses. Rare hypoglycemia. Now also taking Trulicity for diabetes. Complains of diarrhea with this medication. Tolerated Bydureon better, however it was not covered by insurer.   Current orders for Inpatient glycemic control: insulin pump with U500 insulin, Metformin 1000mg  bid  Inpatient Diabetes Program Recommendations:  Continue insulin pump as ordered.  Insulin contract printed and flow sheet printed but need to be completed  by the patient. He changed his sensor on Friday, July 13th and a new pump site on Sunday July 15th.  He does not have supplies at his bedside- I have asked him to get his family to bring all his pump supplies.  Please advise patient he will need to bring U500 to fill his pump.  Patient has not made any insulin pump setting changes since seeing Dr. Gabriel Carina.   Patient does not take Trulicity (referenced in Dr. Joycie Peek note)  Reviewed insulin protocol with RN Lance-flow sheet for insulin pump needs to be completed q shift.  Gentry Fitz, RN, BA, MHA, CDE Diabetes Coordinator Inpatient Diabetes Program  [phone] (Team Pager) [phone] ([place]) 04/06/2017 8:59 AM

## 2017-04-06 NOTE — Care Management (Addendum)
CM screen due to diagnosis of heart failure which appears to be new diagnosis.  Wearing insulin pump. Independent in all adls, denies issues accessing medical care, obtaining medications or with transportation.  Current with  PCP.  Heart Failure education booklet.  Has been scheduled for Heart Failure Clinic.  Has access to scales.  Is not requiring supplemental 02

## 2017-04-07 ENCOUNTER — Encounter
Admission: EM | Disposition: A | Discharge status: Home / Self Care | Payer: Self-pay | Source: Home / Self Care | Attending: Emergency Medicine

## 2017-04-07 ENCOUNTER — Encounter: Payer: Self-pay | Admitting: Internal Medicine

## 2017-04-07 HISTORY — PX: LEFT HEART CATH AND CORONARY ANGIOGRAPHY: CATH118249

## 2017-04-07 LAB — BASIC METABOLIC PANEL
ANION GAP: 8 (ref 5–15)
BUN: 16 mg/dL (ref 6–20)
CALCIUM: 9 mg/dL (ref 8.9–10.3)
CO2: 28 mmol/L (ref 22–32)
CREATININE: 0.91 mg/dL (ref 0.61–1.24)
Chloride: 106 mmol/L (ref 101–111)
Glucose, Bld: 72 mg/dL (ref 65–99)
Potassium: 3.5 mmol/L (ref 3.5–5.1)
SODIUM: 142 mmol/L (ref 135–145)

## 2017-04-07 LAB — GLUCOSE, CAPILLARY
GLUCOSE-CAPILLARY: 71 mg/dL (ref 65–99)
GLUCOSE-CAPILLARY: 117 mg/dL — AB (ref 65–99)
GLUCOSE-CAPILLARY: 212 mg/dL — AB (ref 65–99)
Glucose-Capillary: 118 mg/dL — ABNORMAL HIGH (ref 65–99)
Glucose-Capillary: 179 mg/dL — ABNORMAL HIGH (ref 65–99)

## 2017-04-07 LAB — CARDIAC CATHETERIZATION: Cath EF Quantitative: 60 %

## 2017-04-07 LAB — HIV ANTIBODY (ROUTINE TESTING W REFLEX): HIV SCREEN 4TH GENERATION: NONREACTIVE

## 2017-04-07 SURGERY — LEFT HEART CATH AND CORONARY ANGIOGRAPHY
Anesthesia: Moderate Sedation

## 2017-04-07 MED ORDER — FUROSEMIDE 20 MG PO TABS: 20.0000 mg | ORAL_TABLET | Freq: Two times a day (BID) | ORAL | 11 refills | Status: DC

## 2017-04-07 MED ORDER — MIDAZOLAM HCL 2 MG/2ML IJ SOLN
INTRAMUSCULAR | Status: DC | PRN
  Administered 2017-04-07: 1 mg via INTRAVENOUS

## 2017-04-07 MED ORDER — MIDAZOLAM HCL 2 MG/2ML IJ SOLN
INTRAMUSCULAR | Status: AC
  Filled 2017-04-07: qty 2

## 2017-04-07 MED ORDER — ACETAMINOPHEN 325 MG PO TABS: 650.0000 mg | ORAL_TABLET | ORAL | Status: DC | PRN

## 2017-04-07 MED ORDER — SODIUM CHLORIDE 0.9% FLUSH
3.0000 mL | Freq: Two times a day (BID) | INTRAVENOUS | Status: DC
Start: 2017-04-08 — End: 2017-04-07

## 2017-04-07 MED ORDER — SODIUM CHLORIDE 0.9% FLUSH: 3.0000 mL | Freq: Two times a day (BID) | INTRAVENOUS | Status: DC

## 2017-04-07 MED ORDER — SODIUM CHLORIDE 0.9 % IV SOLN: 250.0000 mL | INTRAVENOUS | Status: DC | PRN

## 2017-04-07 MED ORDER — ROSUVASTATIN CALCIUM 10 MG PO TABS: 10.0000 mg | ORAL_TABLET | Freq: Every day | ORAL | 0 refills | Status: AC

## 2017-04-07 MED ORDER — ASPIRIN 81 MG PO CHEW: 81.0000 mg | CHEWABLE_TABLET | ORAL | Status: DC

## 2017-04-07 MED ORDER — ROSUVASTATIN CALCIUM 10 MG PO TABS: 10.0000 mg | ORAL_TABLET | Freq: Every day | ORAL | Status: DC

## 2017-04-07 MED ORDER — SODIUM CHLORIDE 0.9 % WEIGHT BASED INFUSION: 1.0000 mL/kg/h | INTRAVENOUS | Status: DC

## 2017-04-07 MED ORDER — ISOSORBIDE MONONITRATE ER 30 MG PO TB24: 30.0000 mg | ORAL_TABLET | Freq: Every day | ORAL | Status: DC

## 2017-04-07 MED ORDER — ONDANSETRON HCL 4 MG/2ML IJ SOLN: 4.0000 mg | Freq: Four times a day (QID) | INTRAMUSCULAR | Status: DC | PRN

## 2017-04-07 MED ORDER — SODIUM CHLORIDE 0.9% FLUSH: 3.0000 mL | INTRAVENOUS | Status: DC | PRN

## 2017-04-07 MED ORDER — INSULIN ASPART 100 UNIT/ML ~~LOC~~ SOLN: 0.0000 [IU] | Freq: Three times a day (TID) | SUBCUTANEOUS | Status: DC

## 2017-04-07 MED ORDER — SODIUM CHLORIDE 0.9 % WEIGHT BASED INFUSION: 3.0000 mL/kg/h | INTRAVENOUS | Status: DC

## 2017-04-07 MED ORDER — FENTANYL CITRATE (PF) 100 MCG/2ML IJ SOLN
INTRAMUSCULAR | Status: DC | PRN
  Administered 2017-04-07: 50 ug via INTRAVENOUS

## 2017-04-07 MED ORDER — SODIUM CHLORIDE 0.9% FLUSH
3.0000 mL | Freq: Two times a day (BID) | INTRAVENOUS | Status: DC
  Administered 2017-04-07: 3 mL via INTRAVENOUS

## 2017-04-07 MED ORDER — HEPARIN (PORCINE) IN NACL 2-0.9 UNIT/ML-% IJ SOLN
INTRAMUSCULAR | Status: AC
  Filled 2017-04-07: qty 500

## 2017-04-07 MED ORDER — FENTANYL CITRATE (PF) 100 MCG/2ML IJ SOLN
INTRAMUSCULAR | Status: AC
  Filled 2017-04-07: qty 2

## 2017-04-07 MED ORDER — SODIUM CHLORIDE 0.9 % WEIGHT BASED INFUSION
1.0000 mL/kg/h | INTRAVENOUS | Status: DC
  Administered 2017-04-07: 1 mL/kg/h via INTRAVENOUS

## 2017-04-07 MED ORDER — ISOSORBIDE MONONITRATE ER 30 MG PO TB24: 30.0000 mg | ORAL_TABLET | Freq: Every day | ORAL | 0 refills | Status: DC

## 2017-04-07 MED ORDER — IOPAMIDOL (ISOVUE-300) INJECTION 61%
INTRAVENOUS | Status: DC | PRN
  Administered 2017-04-07: 120 mL via INTRAVENOUS

## 2017-04-07 MED ORDER — SODIUM CHLORIDE 0.9 % WEIGHT BASED INFUSION
3.0000 mL/kg/h | INTRAVENOUS | Status: DC
  Administered 2017-04-07 (×2): 3 mL/kg/h via INTRAVENOUS

## 2017-04-07 MED ORDER — INSULIN ASPART 100 UNIT/ML ~~LOC~~ SOLN: 0.0000 [IU] | Freq: Every day | SUBCUTANEOUS | Status: DC

## 2017-04-07 SURGICAL SUPPLY — 9 items
CATH 5FR JL4 DIAGNOSTIC (CATHETERS) ×3 IMPLANT
CATH INFINITI 5FR ANG PIGTAIL (CATHETERS) ×3 IMPLANT
CATH INFINITI JR4 5F (CATHETERS) ×3 IMPLANT
DEVICE CLOSURE MYNXGRIP 5F (Vascular Products) ×3 IMPLANT
KIT MANI 3VAL PERCEP (MISCELLANEOUS) ×3 IMPLANT
NEEDLE PERC 18GX7CM (NEEDLE) ×3 IMPLANT
PACK CARDIAC CATH (CUSTOM PROCEDURE TRAY) ×3 IMPLANT
SHEATH AVANTI 5FR X 11CM (SHEATH) ×3 IMPLANT
WIRE EMERALD 3MM-J .035X150CM (WIRE) ×3 IMPLANT

## 2017-04-07 NOTE — Progress Notes (Signed)
Patient discharged via wheelchair and private vehicle. IV removed and catheter intact. All discharge instructions given and patient verbalizes understanding. Tele removed and returned. No prescriptions given to patient No distress noted.   

## 2017-04-07 NOTE — Discharge Summary (Signed)
[place] at [facility], 56 y.o., DOB 1961-04-24, MRN [id number]. Admission date: 04/05/2017 Discharge Date 04/07/2017 Primary MD [name], MD Admitting Physician Vaughan Basta, MD  Admission Diagnosis  Acute diastolic CHF (congestive heart failure) (HCC) [I50.31] Chest pain, unspecified type [R07.9]  Discharge Diagnosis   Principal Problem:   Acute diastolic CHF  Coronary artery disease which is now medically being managed  Morbid obesity Essential hypertension Hyperlipidemia Coronary artery disease       Hospital CourseWilliam Cook  is a 56 y.o. male with a known history of Coronary artery disease status post stent in 2011, diabetes, hyperlipidemia, hypertension- was feeling increasingly short of breath with minimal exertion for last few days. Today also started having some chest tightness patient was noted to have congestive heart. He was admitted and treated with IV diuresis with significant improvement in his symptoms. Due to his chest pain symptoms and previous history of stent he was seen by cardiology who did a cardiac catheterization.  Cardiac catheter showed following  Post Atrio lesion, 50 %stenosed.  Ost 1st Diag lesion, 50 %stenosed.  1st Diag lesion, 70 %stenosed.  Dist LAD lesion, 50 %stenosed.  Mid Cx lesion, 50 %stenosed.  Ost 1st Mrg to 1st Mrg lesion, 50 %stenosed.  The left ventricular systolic function is normal.  LV end diastolic pressure is normal.  The left ventricular ejection fraction is 55-65% by visual estimate.  Mid LAD lesion, 0 %stenosed.  Medical management was recommended for his coronary artery disease. Per cardiology addition of Imdur and increasing his Crestor was recommended.      Consults  cardiology  Significant Tests:  See full reports for all details     Dg Chest 2 View  Result Date: 04/06/2017 CLINICAL DATA:  Shortness of breath with chest pain EXAM: CHEST   2 VIEW COMPARISON:  April 05, 2017 and November 24, 2012 FINDINGS: There is persistent interstitial edema without airspace consolidation or volume loss. There is an equivocal right pleural effusion. Heart is borderline enlarged with slight pulmonary venous hypertension. No adenopathy. 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 IMPRESSION: Evidence of a degree of congestive heart failure. No airspace consolidation. Stable cardiac silhouette. Electronically Signed   By: Philip Cook III M.D.   On: 04/06/2017 08:39   Dg Chest 2 View  Result Date: 04/05/2017 CLINICAL DATA:  Chest pressure and shortness of breath for the past week. Lower extremity swelling. EXAM: CHEST  2 VIEW COMPARISON:  11/24/2012. FINDINGS: Stable enlarged cardiac silhouette. Increased prominence of the pulmonary vasculature and interstitial markings. No pleural fluid. 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 IMPRESSION: Mild changes of congestive heart failure with stable cardiomegaly. Electronically Signed   By: Claudie Revering M.D.   On: 04/05/2017 17:37   US Venous Img Lower Bilateral  Result Date: 04/06/2017 CLINICAL DATA:  Lower extremity edema bilaterally EXAM: BILATERAL LOWER EXTREMITY VENOUS DUPLEX ULTRASOUND TECHNIQUE: Gray-scale sonography with graded compression, as well as color Doppler and duplex ultrasound were performed to evaluate the lower extremity deep venous systems from the level of the common femoral vein and including the common femoral, femoral, profunda femoral, popliteal and calf veins including the posterior tibial, peroneal and gastrocnemius veins when visible. The superficial great saphenous vein was also interrogated. Spectral Doppler was utilized to evaluate flow at rest and with distal augmentation maneuvers in the common femoral, femoral and popliteal veins. COMPARISON:  None. FINDINGS: RIGHT LOWER EXTREMITY Common Femoral  Vein: No evidence of thrombus. Normal  compressibility, respiratory phasicity and response to augmentation. Saphenofemoral Junction: No evidence of thrombus. Normal compressibility and flow on color Doppler imaging. Profunda Femoral Vein: No evidence of thrombus. Normal compressibility and flow on color Doppler imaging. Femoral Vein: No evidence of thrombus. Normal compressibility, respiratory phasicity and response to augmentation. Popliteal Vein: No evidence of thrombus. Normal compressibility, respiratory phasicity and response to augmentation. Calf Veins: No evidence of thrombus. Normal compressibility and flow on color Doppler imaging. Superficial Great Saphenous Vein: No evidence of thrombus. Normal compressibility and flow on color Doppler imaging. Venous Reflux:  None. Other Findings:  None. LEFT LOWER EXTREMITY Common Femoral Vein: No evidence of thrombus. Normal compressibility, respiratory phasicity and response to augmentation. Saphenofemoral Junction: No evidence of thrombus. Normal compressibility and flow on color Doppler imaging. Profunda Femoral Vein: No evidence of thrombus. Normal compressibility and flow on color Doppler imaging. Femoral Vein: No evidence of thrombus. Normal compressibility, respiratory phasicity and response to augmentation. Popliteal Vein: No evidence of thrombus. Normal compressibility, respiratory phasicity and response to augmentation. Calf Veins: No evidence of thrombus. Normal compressibility and flow on color Doppler imaging. Superficial Great Saphenous Vein: No evidence of thrombus. Normal compressibility and flow on color Doppler imaging. Venous Reflux:  None. Other Findings:  None. IMPRESSION: No evidence of deep venous thrombosis within either lower extremity. Electronically Signed   By: Philip Cook III M.D.   On: 04/06/2017 11:58       Today   Subjective:   Philip Cook  patient feels well denies any chest pain or shortness of breath  Objective:   Blood pressure 133/68, pulse 68,  temperature 98.3 F (36.8 C), resp. rate (!) 21, height 5\' 10"  (1.778 m), weight (!) 325 lb 2.1 oz (147.5 kg), SpO2 96 %.  .  Intake/Output Summary (Last 24 hours) at 04/07/17 1518 Last data filed at 04/07/17 1220  Gross per 24 hour  Intake              240 ml  Output             4000 ml  Net            -3760 ml    Exam VITAL SIGNS: Blood pressure 133/68, pulse 68, temperature 98.3 F (36.8 C), resp. rate (!) 21, height 5\' 10"  (1.778 m), weight (!) 325 lb 2.1 oz (147.5 kg), SpO2 96 %.  GENERAL:  56 y.o.-year-old patient lying in the bed with no acute distress.  EYES: Pupils equal, round, reactive to light and accommodation. No scleral icterus. Extraocular muscles intact.  HEENT: Head atraumatic, normocephalic. Oropharynx and nasopharynx clear.  NECK:  Supple, no jugular venous distention. No thyroid enlargement, no tenderness.  LUNGS: Normal breath sounds bilaterally, no wheezing, rales,rhonchi or crepitation. No use of accessory muscles of respiration.  CARDIOVASCULAR: S1, S2 normal. No murmurs, rubs, or gallops.  ABDOMEN: Soft, nontender, nondistended. Bowel sounds present. No organomegaly or mass.  EXTREMITIES: No pedal edema, cyanosis, or clubbing.  NEUROLOGIC: Cranial nerves II through XII are intact. Muscle strength 5/5 in all extremities. Sensation intact. Gait not checked.  PSYCHIATRIC: The patient is alert and oriented x 3.  SKIN: No obvious rash, lesion, or ulcer.   Data Review     CBC w Diff:  Lab Results  Component Value Date   WBC 10.5 04/06/2017   HGB 12.3 (L) 04/06/2017   HGB 12.2 (L) 11/13/2012   HCT 36.6 (L) 04/06/2017   HCT 36.4 (L) 11/13/2012  PLT 197 04/06/2017   PLT 202 11/13/2012   LYMPHOPCT 25.0 11/13/2012   MONOPCT 6.0 11/13/2012   EOSPCT 1.0 11/13/2012   BASOPCT 0.5 11/13/2012   CMP:  Lab Results  Component Value Date   NA 142 04/07/2017   NA 137 11/13/2012   K 3.5 04/07/2017   K 4.1 11/13/2012   CL 106 04/07/2017   CL 101 11/13/2012    CO2 28 04/07/2017   CO2 28 11/13/2012   BUN 16 04/07/2017   BUN 25 (H) 11/13/2012   CREATININE 0.91 04/07/2017   CREATININE 1.05 11/13/2012   PROT 7.1 04/05/2017   PROT 6.6 11/13/2012   ALBUMIN 3.9 04/05/2017   ALBUMIN 3.2 (L) 11/13/2012   BILITOT 0.6 04/05/2017   BILITOT 0.7 11/13/2012   ALKPHOS 64 04/05/2017   ALKPHOS 75 11/13/2012   AST 27 04/05/2017   AST 51 (H) 11/13/2012   ALT 29 04/05/2017   ALT 53 11/13/2012  .  Micro Results No results found for this or any previous visit (from the past 240 hour(s)).      Code Status Orders        Start     Ordered   04/05/17 2053  Full code  Continuous     04/05/17 2052    Code Status History    Date Active Date Inactive Code Status Order ID Comments User Context   This patient has a current code status but no historical code status.          Follow-up Information    Alisa Graff, FNP. Go on 04/19/2017.   Specialty:  Family Medicine Why:  at 9:20am  Contact information: [address] [place] [id number] [phone]        Isaias Cowman, MD Follow up in 1 week(s).   Specialty:  Cardiology Why:  cad,chf Contact information: [facility] [place] [address] [phone]           Discharge Medications   Allergies as of 04/07/2017      Reactions   Sulfa Antibiotics Rash      Medication List    STOP taking these medications   metFORMIN 1000 MG (MOD) 24 hr tablet Commonly known as:  GLUMETZA     TAKE these medications   aspirin 325 MG tablet Take 325 mg by mouth daily.   furosemide 20 MG tablet Commonly known as:  LASIX Take 1 tablet (20 mg total) by mouth 2 (two) times daily.   insulin pump Soln Inject into the skin.   isosorbide mononitrate 30 MG 24 hr tablet Commonly known as:  IMDUR Take 1 tablet (30 mg total) by mouth daily.   meloxicam 7.5 MG tablet Commonly known as:  MOBIC Take 7.5 mg by mouth  daily.   nebivolol 10 MG tablet Commonly known as:  BYSTOLIC Take 10 mg by mouth daily.   olmesartan-hydrochlorothiazide 40-25 MG tablet Commonly known as:  BENICAR HCT Take 1 tablet by mouth daily.   prasugrel 10 MG Tabs tablet Commonly known as:  EFFIENT TAKE ONE TABLET BY MOUTH EVERY DAY   rosuvastatin 10 MG tablet Commonly known as:  CRESTOR Take 1 tablet (10 mg total) by mouth daily. What changed:  medication strength  how much to take          Total Time in preparing paper work, data evaluation and todays exam - 35 minutes  Dustin Flock M.D on 04/07/2017 at 3:18 PM  [facility]  Office  [phone]

## 2017-04-07 NOTE — Progress Notes (Signed)
Inpatient Diabetes Program Recommendations  AACE/ADA: New Consensus Statement on Inpatient Glycemic Control (2015)  Target Ranges:  Prepandial:   less than 140 mg/dL      Peak postprandial:   less than 180 mg/dL (1-2 hours)      Critically ill patients:  140 - 180 mg/dL   Results for Philip Cook, Philip Cook (MRN [id number]) as of 04/07/2017 14:06  Ref. Range 04/06/2017 12:13 04/06/2017 16:42 04/06/2017 20:58 04/06/2017 22:03 04/07/2017 01:48 04/07/2017 06:22 04/07/2017 07:57 04/07/2017 12:01  Glucose-Capillary Latest Ref Range: 65 - 99 mg/dL 79 110 (H) 56 (L) 91 71 118 (H) 117 (H) 212 (H)   Review of Glycemic Control  Diabetes history: DM2 Outpatient Diabetes medications: Medtronic insulin pump with Humulin R U500 (concentrated insulin), Glumetza 2000 mg daily Current orders for Inpatient glycemic control: Medtronic insulin pump with Humulin R U500 (concentrated insulin), Metformin XR 1000 mg BID, Novolog 0-15 units TID with meals, Novolog 0-5 units QHS  Inpatient Diabetes Program Recommendations:  Insulin Pump: Per chart review, note patient turned off insulin pump this morning due to hypoglycemia and being NPO for heart cath today. Patient has recently been ordered Novolog moderate correction scale. Patient is currently in Cath Lab. Called Cath Lab and spoke with Bevely Palmer, RN. Per RN, patient still has insulin pump off. Explained that patient has U500 in insulin pump and that while the pump is off SQ insulin will be needed. Asked that once patient has finished procedure and is alert and able to operate his insulin pump that it be resumed. If insulin pump is resumed after cath, please discontinue Novolog correction scale.  Thanks, Barnie Alderman, RN, MSN, CDE Diabetes Coordinator Inpatient Diabetes Program [phone] (Team Pager from 8am to 5pm)

## 2017-04-07 NOTE — Progress Notes (Signed)
Pt reports that at 0448 he checked his glucose and his sugar was at 56,  Pt did drink 8 OZ of orange juice around 0450. Pt had been NPO but continued using his insulin pump, Pt reports that his insulin pump is set to give him automatic boluses every 3-4 hours. Requested pt to turn off pump for now, Made MD aware of situation. Pt is scheduled for a cardiac Cath today.

## 2017-04-07 NOTE — Progress Notes (Signed)
Hypoglycemic Event  CBG: 56  Treatment: 8 OZ orange juice  Symptoms: Asymptomatic  Follow-up CBG: Time:2203 CBG Result:97 Possible Reasons for Event: Pt uses his own insulin pump  Comments/MD notified:Willis   Lubertha Leite Miguel Dibble

## 2017-04-07 NOTE — Discharge Instructions (Addendum)
Aspirin and Your Heart Aspirin is a medicine that affects the way blood clots. Aspirin can be used to help reduce the risk of blood clots, heart attacks, and other heart-related problems. Should I take aspirin? Your health care provider will help you determine whether it is safe and beneficial for you to take aspirin daily. Taking aspirin daily may be beneficial if you:  Have had a heart attack or chest pain.  Have undergone open heart surgery such as coronary artery bypass surgery (CABG).  Have had coronary angioplasty.  Have experienced a stroke or transient ischemic attack (TIA).  Have peripheral vascular disease (PVD).  Have chronic heart rhythm problems such as atrial fibrillation.  Are there any risks of taking aspirin daily? Daily use of aspirin can increase your risk of side effects. Some of these include:  Bleeding. Bleeding problems can be minor or serious. An example of a minor problem is a cut that does not stop bleeding. An example of a more serious problem is stomach bleeding or bleeding into the brain. Your risk of bleeding is increased if you are also taking non-steroidal anti-inflammatory medicine (NSAIDs).  Increased bruising.  Upset stomach.  An allergic reaction. People who have nasal polyps have an increased risk of developing an aspirin allergy.  What are some guidelines I should follow when taking aspirin?  Take aspirin only as directed by your health care provider. Make sure you understand how much you should take and what form you should take. The two forms of aspirin are: ? Non-enteric-coated. This type of aspirin does not have a coating and is absorbed quickly. Non-enteric-coated aspirin is usually recommended for people with chest pain. This type of aspirin also comes in a chewable form. ? Enteric-coated. This type of aspirin has a special coating that releases the medicine very slowly. Enteric-coated aspirin causes less stomach upset than  non-enteric-coated aspirin. This type of aspirin should not be chewed or crushed.  Drink alcohol in moderation. Drinking alcohol increases your risk of bleeding. When should I seek medical care?  You have unusual bleeding or bruising.  You have stomach pain.  You have an allergic reaction. Symptoms of an allergic reaction include: ? Hives. ? Itchy skin. ? Swelling of the lips, tongue, or face.  You have ringing in your ears. When should I seek immediate medical care?  Your bowel movements are bloody, dark red, or black in color.  You vomit or cough up blood.  You have blood in your urine.  You cough, wheeze, or feel short of breath. If you have any of the following symptoms, this is an emergency. Do not wait to see if the pain will go away. Get medical help at once. Call your local emergency services (911 in the [place] Do not drive yourself to the hospital.  You have severe chest pain, especially if the pain is crushing or pressure-like and spreads to the arms, back, neck, or jaw.  You have stroke-like symptoms, such as: ? Loss of vision. ? Difficulty talking. ? Numbness or weakness on one side of your body. ? Numbness or weakness in your arm or leg. ? Not thinking clearly or feeling confused.  This information is not intended to replace advice given to you by your health care provider. Make sure you discuss any questions you have with your health care provider. Document Released: 08/20/2008 Document Revised: 01/15/2016 Document Reviewed: 12/13/2013 Elsevier Interactive Patient Education  2018 [place].   Heart Failure Heart failure means your heart  has trouble pumping blood. This makes it hard for your body to work well. Heart failure is usually a long-term (chronic) condition. You must take good care of yourself and follow your doctor's treatment plan. Follow these instructions at home:  Take your heart medicine as told by your doctor. ? Do not stop taking medicine  unless your doctor tells you to. ? Do not skip any dose of medicine. ? Refill your medicines before they run out. ? Take other medicines only as told by your doctor or pharmacist.  Stay active if told by your doctor. The elderly and people with severe heart failure should talk with a doctor about physical activity.  Eat heart-healthy foods. Choose foods that are without trans fat and are low in saturated fat, cholesterol, and salt (sodium). This includes fresh or frozen fruits and vegetables, fish, lean meats, fat-free or low-fat dairy foods, whole grains, and high-fiber foods. Lentils and dried peas and beans (legumes) are also good choices.  Limit salt if told by your doctor.  Cook in a healthy way. Roast, grill, broil, bake, poach, steam, or stir-fry foods.  Limit fluids as told by your doctor.  Weigh yourself every morning. Do this after you pee (urinate) and before you eat breakfast. Write down your weight to give to your doctor.  Take your blood pressure and write it down if your doctor tells you to.  Ask your doctor how to check your pulse. Check your pulse as told.  Lose weight if told by your doctor.  Stop smoking or chewing tobacco. Do not use gum or patches that help you quit without your doctor's approval.  Schedule and go to doctor visits as told.  Nonpregnant women should have no more than 1 drink a day. Men should have no more than 2 drinks a day. Talk to your doctor about drinking alcohol.  Stop illegal drug use.  Stay current with shots (immunizations).  Manage your health conditions as told by your doctor.  Learn to manage your stress.  Rest when you are tired.  If it is really hot outside: ? Avoid intense activities. ? Use air conditioning or fans, or get in a cooler place. ? Avoid caffeine and alcohol. ? Wear loose-fitting, lightweight, and light-colored clothing.  If it is really cold outside: ? Avoid intense activities. ? Layer your  clothing. ? Wear mittens or gloves, a hat, and a scarf when going outside. ? Avoid alcohol.  Learn about heart failure and get support as needed.  Get help to maintain or improve your quality of life and your ability to care for yourself as needed. Contact a doctor if:  You gain weight quickly.  You are more short of breath than usual.  You cannot do your normal activities.  You tire easily.  You cough more than normal, especially with activity.  You have any or more puffiness (swelling) in areas such as your hands, feet, ankles, or belly (abdomen).  You cannot sleep because it is hard to breathe.  You feel like your heart is beating fast (palpitations).  You get dizzy or light-headed when you stand up. Get help right away if:  You have trouble breathing.  There is a change in mental status, such as becoming less alert or not being able to focus.  You have chest pain or discomfort.  You faint. This information is not intended to replace advice given to you by your health care provider. Make sure you discuss any questions you have with  your health care provider. Document Released: 06/16/2008 Document Revised: 02/13/2016 Document Reviewed: 10/24/2012 Elsevier Interactive Patient Education  2017 [place].  Femoral Site Care Refer to this sheet in the next few weeks. These instructions provide you with information about caring for yourself after your procedure. Your health care provider may also give you more specific instructions. Your treatment has been planned according to current medical practices, but problems sometimes occur. Call your health care provider if you have any problems or questions after your procedure. What can I expect after the procedure? After your procedure, it is typical to have the following:  Bruising at the site that usually fades within 1-2 weeks.  Blood collecting in the tissue (hematoma) that may be painful to the touch. It should usually  decrease in size and tenderness within 1-2 weeks.  Follow these instructions at home:  Take medicines only as directed by your health care provider.  You may shower 24-48 hours after the procedure or as directed by your health care provider. Remove the bandage (dressing) and gently wash the site with plain soap and water. Pat the area dry with a clean towel. Do not rub the site, because this may cause bleeding.  Do not take baths, swim, or use a hot tub until your health care provider approves.  Check your insertion site every day for redness, swelling, or drainage.  Do not apply powder or lotion to the site.  Limit use of stairs to twice a day for the first 2-3 days or as directed by your health care provider.  Do not squat for the first 2-3 days or as directed by your health care provider.  Do not lift over 10 lb (4.5 kg) for 5 days after your procedure or as directed by your health care provider.  Ask your health care provider when it is okay to: ? Return to work or school. ? Resume usual physical activities or sports. ? Resume sexual activity.  Do not drive home if you are discharged the same day as the procedure. Have someone else drive you.  You may drive 24 hours after the procedure unless otherwise instructed by your health care provider.  Do not operate machinery or power tools for 24 hours after the procedure or as directed by your health care provider.  If your procedure was done as an outpatient procedure, which means that you went home the same day as your procedure, a responsible adult should be with you for the first 24 hours after you arrive home.  Keep all follow-up visits as directed by your health care provider. This is important. Contact a health care provider if:  You have a fever.  You have chills.  You have increased bleeding from the site. Hold pressure on the site. Get help right away if:  You have unusual pain at the site.  You have redness,  warmth, or swelling at the site.  You have drainage (other than a small amount of blood on the dressing) from the site.  The site is bleeding, and the bleeding does not stop after 30 minutes of holding steady pressure on the site.  Your leg or foot becomes pale, cool, tingly, or numb. This information is not intended to replace advice given to you by your health care provider. Make sure you discuss any questions you have with your health care provider. Document Released: 05/11/2014 Document Revised: 02/13/2016 Document Reviewed: 03/27/2014 Elsevier Interactive Patient Education  2018 [place]. Isosorbide Mononitrate extended-release tablets What  is this medicine? ISOSORBIDE MONONITRATE (eye soe SOR bide mon oh NYE trate) is a vasodilator. It relaxes blood vessels, increasing the blood and oxygen supply to your heart. This medicine is used to prevent chest pain caused by angina. It will not help to stop an episode of chest pain. This medicine may be used for other purposes; ask your health care provider or pharmacist if you have questions. COMMON BRAND NAME(S): Imdur, Isotrate ER What should I tell my health care provider before I take this medicine? They need to know if you have any of these conditions: -previous heart attack or heart failure -an unusual or allergic reaction to isosorbide mononitrate, nitrates, other medicines, foods, dyes, or preservatives -pregnant or trying to get pregnant -breast-feeding How should I use this medicine? Take this medicine by mouth with a glass of water. Follow the directions on the prescription label. Do not crush or chew. Take your medicine at regular intervals. Do not take your medicine more often than directed. Do not stop taking this medicine except on the advice of your doctor or health care professional. Talk to your pediatrician regarding the use of this medicine in children. Special care may be needed. Overdosage: If you think you have taken  too much of this medicine contact a poison control center or emergency room at once. NOTE: This medicine is only for you. Do not share this medicine with others. What if I miss a dose? If you miss a dose, take it as soon as you can. If it is almost time for your next dose, take only that dose. Do not take double or extra doses. What may interact with this medicine? Do not take this medicine with any of the following medications: -medicines used to treat erectile dysfunction (ED) like avanafil, sildenafil, tadalafil, and vardenafil -riociguat This medicine may also interact with the following medications: -medicines for high blood pressure -other medicines for angina or heart failure This list may not describe all possible interactions. Give your health care provider a list of all the medicines, herbs, non-prescription drugs, or dietary supplements you use. Also tell them if you smoke, drink alcohol, or use illegal drugs. Some items may interact with your medicine. What should I watch for while using this medicine? Check your heart rate and blood pressure regularly while you are taking this medicine. Ask your doctor or health care professional what your heart rate and blood pressure should be and when you should contact him or her. Tell your doctor or health care professional if you feel your medicine is no longer working. You may get dizzy. Do not drive, use machinery, or do anything that needs mental alertness until you know how this medicine affects you. To reduce the risk of dizzy or fainting spells, do not sit or stand up quickly, especially if you are an older patient. Alcohol can make you more dizzy, and increase flushing and rapid heartbeats. Avoid alcoholic drinks. Do not treat yourself for coughs, colds, or pain while you are taking this medicine without asking your doctor or health care professional for advice. Some ingredients may increase your blood pressure. What side effects may I  notice from receiving this medicine? Side effects that you should report to your doctor or health care professional as soon as possible: -bluish discoloration of lips, fingernails, or palms of hands -irregular heartbeat, palpitations -low blood pressure -nausea, vomiting -persistent headache -unusually weak or tired Side effects that usually do not require medical attention (report to your doctor or  health care professional if they continue or are bothersome): -flushing of the face or neck -rash This list may not describe all possible side effects. Call your doctor for medical advice about side effects. You may report side effects to FDA at 1-800-FDA-1088. Where should I keep my medicine? Keep out of the reach of children. Store between 15 and 30 degrees C (59 and 86 degrees F). Keep container tightly closed. Throw away any unused medicine after the expiration date. NOTE: This sheet is a summary. It may not cover all possible information. If you have questions about this medicine, talk to your doctor, pharmacist, or health care provider.  2018 Elsevier/Gold Standard (2013-07-07 14:48:19) [facility] [place] [facility] [place]:  Low cardiac diet  DISCHARGE CONDITION:  Stable  ACTIVITY:  Activity as tolerated  OXYGEN:  Home Oxygen: No.   Oxygen Delivery: room air  DISCHARGE LOCATION:  home    ADDITIONAL DISCHARGE INSTRUCTION:   If you experience worsening of your admission symptoms, develop shortness of breath, life threatening emergency, suicidal or homicidal thoughts you must seek medical attention immediately by calling 911 or calling your MD immediately  if symptoms less severe.  You Must read complete instructions/literature along with all the possible adverse reactions/side effects for all the Medicines you take and that have been prescribed to you. Take any new Medicines after you have completely understood and accpet all the possible adverse  reactions/side effects.   Please note  You were cared for by a hospitalist during your hospital stay. If you have any questions about your discharge medications or the care you received while you were in the hospital after you are discharged, you can call the unit and asked to speak with the hospitalist on call if the hospitalist that took care of you is not available. Once you are discharged, your primary care physician will handle any further medical issues. Please note that NO REFILLS for any discharge medications will be authorized once you are discharged, as it is imperative that you return to your primary care physician (or establish a relationship with a primary care physician if you do not have one) for your aftercare needs so that they can reassess your need for medications and monitor your lab values.

## 2017-04-07 NOTE — OR Nursing (Signed)
Report called to Caryl Pina, RN, orders reviewed. Report that right groin site is dry intact with no hematoma. Vital signs are stable, pt is having no pain, awake and oriented x4.

## 2017-04-19 ENCOUNTER — Encounter: Payer: Self-pay | Admitting: Family

## 2017-04-19 ENCOUNTER — Ambulatory Visit: Payer: BC Managed Care – PPO | Attending: Family | Admitting: Family

## 2017-04-19 DIAGNOSIS — E785 Hyperlipidemia, unspecified: Secondary | ICD-10-CM | POA: Insufficient documentation

## 2017-04-19 DIAGNOSIS — I5032 Chronic diastolic (congestive) heart failure: Secondary | ICD-10-CM | POA: Diagnosis not present

## 2017-04-19 DIAGNOSIS — I2510 Atherosclerotic heart disease of native coronary artery without angina pectoris: Secondary | ICD-10-CM | POA: Insufficient documentation

## 2017-04-19 DIAGNOSIS — Z8249 Family history of ischemic heart disease and other diseases of the circulatory system: Secondary | ICD-10-CM | POA: Insufficient documentation

## 2017-04-19 DIAGNOSIS — E109 Type 1 diabetes mellitus without complications: Secondary | ICD-10-CM

## 2017-04-19 DIAGNOSIS — I110 Hypertensive heart disease with heart failure: Secondary | ICD-10-CM | POA: Insufficient documentation

## 2017-04-19 DIAGNOSIS — Z7982 Long term (current) use of aspirin: Secondary | ICD-10-CM | POA: Diagnosis not present

## 2017-04-19 DIAGNOSIS — R0602 Shortness of breath: Secondary | ICD-10-CM | POA: Diagnosis not present

## 2017-04-19 DIAGNOSIS — Z9889 Other specified postprocedural states: Secondary | ICD-10-CM | POA: Insufficient documentation

## 2017-04-19 DIAGNOSIS — Z794 Long term (current) use of insulin: Secondary | ICD-10-CM | POA: Diagnosis not present

## 2017-04-19 DIAGNOSIS — E119 Type 2 diabetes mellitus without complications: Secondary | ICD-10-CM | POA: Diagnosis not present

## 2017-04-19 DIAGNOSIS — G4733 Obstructive sleep apnea (adult) (pediatric): Secondary | ICD-10-CM | POA: Diagnosis not present

## 2017-04-19 DIAGNOSIS — I10 Essential (primary) hypertension: Secondary | ICD-10-CM | POA: Insufficient documentation

## 2017-04-19 NOTE — Progress Notes (Signed)
Patient ID: Philip Cook, male    DOB: [birth year], 56 y.o.   MRN: [id number]  HPI  Mr Valentino is a 56 y/o male with a history of HTN, hyperlipidemia, diabetes, CAD, obstructive sleep apnea and chronic heart failure.   Reviewed echo/stress test report from 12/11/16 which showed an EF of 55% along with trivial MR/TR. Normal stress ECG results. Had a cardiac catheterization dene 04/07/17 which showed normal left ventricular function of 60%, patent proximal LAD stent, diagonal 1 and circumflex with moderate disease and RCA relatively free of disease.   Admitted 04/05/17 with chest pain and heart failure. Initially given IV diuretics. Cardiology consult obtained and catheterization done. Medication management was recommended and he was discharged home after 2 days.   He presents today for his initial visit with a chief complaint of mild shortness of breath with moderate exertion. He describes this as being chronic in nature over the last few months. He does feel like his breathing has improved since hospital discharge. He has associated fatigue which is improving and minimal edema. Has been weighing daily.   Past Medical History:  Diagnosis Date  . CHF (congestive heart failure) ([place])   . Coronary artery disease   . Diabetes mellitus without complication ([place])   . Hyperlipidemia   . Hypertension    Past Surgical History:  Procedure Laterality Date  . CORONARY ANGIOPLASTY WITH STENT PLACEMENT    . LEFT HEART CATH AND CORONARY ANGIOGRAPHY N/A 04/07/2017   Procedure: Left Heart Cath and Coronary Angiography and possible PCI;  Surgeon: Yolonda Kida, MD;  Location: [facility];  Service: Cardiovascular;  Laterality: N/A;   Family History  Problem Relation Age of Onset  . CAD Father   . Heart failure Brother    Social History  Substance Use Topics  . Smoking status: Never Smoker  . Smokeless tobacco: Never Used  . Alcohol use No   Allergies  Allergen Reactions  . Sulfa  Antibiotics Rash   Prior to Admission medications   Medication Sig Start Date End Date Taking? Authorizing Provider  aspirin 325 MG tablet Take 325 mg by mouth daily.   Yes [provider]  furosemide (LASIX) 20 MG tablet Take 1 tablet (20 mg total) by mouth 2 (two) times daily. 04/07/17 04/07/18 Yes Dustin Flock, MD  Insulin Human (INSULIN PUMP) SOLN Inject into the skin.   Yes [provider]  isosorbide mononitrate (IMDUR) 30 MG 24 hr tablet Take 1 tablet (30 mg total) by mouth daily. 04/07/17  Yes Dustin Flock, MD  meloxicam (MOBIC) 7.5 MG tablet Take 7.5 mg by mouth daily.   Yes [provider]  nebivolol (BYSTOLIC) 10 MG tablet Take 10 mg by mouth daily.   Yes [provider]  olmesartan-hydrochlorothiazide (BENICAR HCT) 40-25 MG tablet Take 1 tablet by mouth daily.   Yes [provider]  prasugrel (EFFIENT) 10 MG TABS tablet TAKE ONE TABLET BY MOUTH EVERY DAY 01/04/17  Yes [provider]  rosuvastatin (CRESTOR) 10 MG tablet Take 1 tablet (10 mg total) by mouth daily. 04/08/17  Yes Dustin Flock, MD    Review of Systems  Constitutional: Positive for fatigue. Negative for appetite change.  HENT: Negative for congestion, postnasal drip and sore throat.   Eyes: Negative.   Respiratory: Positive for shortness of breath (going up steps). Negative for chest tightness.   Cardiovascular: Positive for leg swelling (improving). Negative for chest pain and palpitations.  Gastrointestinal: Negative for abdominal distention and  abdominal pain.  Endocrine: Negative.   Genitourinary: Negative.   Musculoskeletal: Positive for arthralgias (right shoulder). Negative for back pain.  Skin: Negative.   Allergic/Immunologic: Negative.   Neurological: Negative for dizziness and light-headedness.  Hematological: Negative for adenopathy. Does not bruise/bleed easily.  Psychiatric/Behavioral: Positive for dysphoric mood (wife feels like patient is  depressed). Negative for sleep disturbance (wearing CPAP nightly). The patient is not nervous/anxious.    Vitals:   04/19/17 0915  BP: (!) 106/59  Pulse: 74  Resp: 20  SpO2: 99%  Weight: (!) 320 lb 2 oz (145.2 kg)  Height: 5\' 10"  (1.778 m)   Wt Readings from Last 3 Encounters:  04/19/17 (!) 320 lb 2 oz (145.2 kg)  04/07/17 (!) 325 lb 2.1 oz (147.5 kg)   Lab Results  Component Value Date   CREATININE 0.91 04/07/2017   CREATININE 0.82 04/06/2017   CREATININE 0.90 04/05/2017    Physical Exam  Constitutional: He is oriented to person, place, and time. He appears well-developed and well-nourished.  HENT:  Head: Normocephalic and atraumatic.  Neck: Normal range of motion. Neck supple. No JVD present.  Cardiovascular: Normal rate and regular rhythm.   Pulmonary/Chest: Effort normal. He has no wheezes. He has no rales.  Abdominal: Soft. He exhibits no distension. There is no tenderness.  Musculoskeletal: He exhibits edema (minimal edema in left lower leg). He exhibits no tenderness.  Neurological: He is alert and oriented to person, place, and time.  Skin: Skin is warm and dry.  Psychiatric: He has a normal mood and affect. His behavior is normal. Thought content normal.  Nursing note and vitals reviewed.  Assessment & Plan:  1: Chronic heart failure with preserved ejection fraction- - NYHA class II - euvolemic today - already weighing daily and home weight chart reviewed which shows a few pound weight loss. Instructed to call for an overnight weight gain of >2 pounds or a weekly weight gain of >5 pounds - not adding salt to his food and has been reading food labels. Was eating goldfish until he read the sodium content. Discussed the importance of closely following a 2000mg  sodium diet.  - minimal swelling in his leg and he does wear TED hose  - was drinking 4L of fluids daily until his admission and since discharge he's reduced his intake to 2L. Was drinking a lot of mountain dew  and has switched to caffeine free. - sees cardiologist (Paraschos) later today - discussed [facility] with patient and a brochure was given to him about this  2: DM- - drinking glucerna - glucose this morning was 63 - saw endocrinologist (Solum) 01/29/17  3: HTN- - BP looks good today - saw PCP ([facility]) 01/01/17  4: Obstructive sleep apnea- - wearing CPAP nightly - sleeps in a recliner due to pain in his right shoulder  Patient mentions that his brother died 81 months ago of heart disease and his father died from HF/MI. Patient tearful when talking about this but denies feeling depressed. Making good lifestyle changes so that he doesn't "end up like them". Encouraged him to follow-up with his PCP if he feels like he's getting depressed.   Patient did not bring his medications nor a list. Each medication was verbally reviewed with the patient and he was encouraged to bring the bottles to every visit to confirm accuracy of list.  Return in 1 month or sooner for any questions/problems before then.

## 2017-04-19 NOTE — Patient Instructions (Signed)
Continue weighing daily and call for an overnight weight gain of > 2 pounds or a weekly weight gain of >5 pounds. 

## 2017-05-13 ENCOUNTER — Encounter: Payer: Self-pay | Admitting: *Deleted

## 2017-05-13 ENCOUNTER — Encounter: Payer: BC Managed Care – PPO | Attending: Cardiology | Admitting: *Deleted

## 2017-05-13 VITALS — Ht 69.5 in | Wt 325.7 lb

## 2017-05-13 DIAGNOSIS — Z7982 Long term (current) use of aspirin: Secondary | ICD-10-CM | POA: Insufficient documentation

## 2017-05-13 DIAGNOSIS — Z79899 Other long term (current) drug therapy: Secondary | ICD-10-CM | POA: Insufficient documentation

## 2017-05-13 DIAGNOSIS — I110 Hypertensive heart disease with heart failure: Secondary | ICD-10-CM | POA: Diagnosis not present

## 2017-05-13 DIAGNOSIS — E785 Hyperlipidemia, unspecified: Secondary | ICD-10-CM | POA: Diagnosis not present

## 2017-05-13 DIAGNOSIS — I2510 Atherosclerotic heart disease of native coronary artery without angina pectoris: Secondary | ICD-10-CM | POA: Insufficient documentation

## 2017-05-13 DIAGNOSIS — Z9641 Presence of insulin pump (external) (internal): Secondary | ICD-10-CM | POA: Insufficient documentation

## 2017-05-13 DIAGNOSIS — I5032 Chronic diastolic (congestive) heart failure: Secondary | ICD-10-CM | POA: Diagnosis present

## 2017-05-13 NOTE — Progress Notes (Signed)
Daily Session Note  Patient Details  Name: Philip Cook MRN: [id number] Date of Birth: 11/16/1960 Referring Provider:     Cardiac Rehab from 05/13/2017 in [facility] Cardiac [facility] Pul[facility]  Referring Provider  Paraschos      Encounter Date: 05/13/2017  Check In:     Session Check In - 05/13/17 1310      Check-In   Location [facility]-Cardiac & Pulmonary Rehab   Staff Present Renita Papa, RN BSN;Susanne Bice, RN, BSN, Lance Sell, BA, ACSM CEP, Exercise Physiologist   Supervising physician immediately available to respond to emergencies See telemetry face sheet for immediately available ER MD   Medication changes reported     No   Fall or balance concerns reported    No   Warm-up and Cool-down Performed as group-led instruction   Resistance Training Performed Yes   VAD Patient? No     Pain Assessment   Currently in Pain? No/denies           Exercise Prescription Changes - 05/13/17 1400      Response to Exercise   Blood Pressure (Admit) 136/72   Blood Pressure (Exercise) 154/68   Heart Rate (Admit) 66 bpm   Heart Rate (Exercise) 115 bpm   Heart Rate (Exit) 64 bpm   Oxygen Saturation (Admit) 97 %   Oxygen Saturation (Exercise) 99 %   Rating of Perceived Exertion (Exercise) 11      History  Smoking Status  . Never Smoker  Smokeless Tobacco  . Never Used    Goals Met:  Proper associated with RPD/PD & O2 Sat Exercise tolerated well Personal goals reviewed No report of cardiac concerns or symptoms Strength training completed today  Goals Unmet:  Not Applicable  Comments: Med Review Completed    Dr. Emily Filbert is Medical Director for [place] and [facility].

## 2017-05-13 NOTE — Patient Instructions (Signed)
Patient Instructions  Patient Details  Name: Philip Cook MRN: [id number] Date of Birth: 07-11-1961 Referring Provider:  Isaias Cowman, MD  Below are the personal goals you chose as well as exercise and nutrition goals. Our goal is to help you keep on track towards obtaining and maintaining your goals. We will be discussing your progress on these goals with you throughout the program.  Initial Exercise Prescription:     Initial Exercise Prescription - 05/13/17 1500      Date of Initial Exercise RX and Referring Provider   Date 05/13/17   Referring Provider Paraschos     Treadmill   MPH 2.3   Grade 1   Minutes 15   METs 3     Recumbant Bike   Level 4   RPM 60   Watts 50   Minutes 15   METs 3.17     Recumbant Elliptical   Level 2   RPM 50   Minutes 15   METs 3     REL-XR   Level 3   Speed 50   Minutes 15   METs 3     T5 Nustep   Level 3   SPM 80   Minutes 15   METs 3     Prescription Details   Frequency (times per week) 3   Duration Progress to 30 minutes of continuous aerobic without signs/symptoms of physical distress     Intensity   THRR 40-80% of Max Heartrate 106-146   Ratings of Perceived Exertion 11-13     Resistance Training   Training Prescription Yes   Weight 4   Reps 10-15      Exercise Goals: Frequency: Be able to perform aerobic exercise three times per week working toward 3-5 days per week.  Intensity: Work with a perceived exertion of 11 (fairly light) - 15 (hard) as tolerated. Follow your new exercise prescription and watch for changes in prescription as you progress with the program. Changes will be reviewed with you when they are made.  Duration: You should be able to do 30 minutes of continuous aerobic exercise in addition to a 5 minute warm-up and a 5 minute cool-down routine.  Nutrition Goals: Your personal nutrition goals will be established when you do your nutrition analysis with the dietician.  The following  are nutrition guidelines to follow: Cholesterol < 200mg /day Sodium < 1500mg /day Fiber: Men over 50 yrs - 30 grams per day  Personal Goals:     Personal Goals and Risk Factors at Admission - 05/13/17 1317      Core Components/Risk Factors/Patient Goals on Admission    Weight Management Yes   Intervention Obesity: Provide education and appropriate resources to help participant work on and attain dietary goals.;Weight Management: Develop a combined nutrition and exercise program designed to reach desired caloric intake, while maintaining appropriate intake of nutrient and fiber, sodium and fats, and appropriate energy expenditure required for the weight goal.;Weight Management: Provide education and appropriate resources to help participant work on and attain dietary goals.;Weight Management/Obesity: Establish reasonable short term and long term weight goals.   Admit Weight 327 lb 8 oz (148.6 kg)   Goal Weight: Short Term 323 lb (146.5 kg)   Goal Weight: Long Term 240 lb (108.9 kg)   Expected Outcomes Short Term: Continue to assess and modify interventions until short term weight is achieved;Long Term: Adherence to nutrition and physical activity/exercise program aimed toward attainment of established weight goal;Weight Loss: Understanding of general recommendations for a  balanced deficit meal plan, which promotes 1-2 lb weight loss per week and includes a negative energy balance of [phone] kcal/d;Understanding recommendations for meals to include 15-35% energy as protein, 25-35% energy from fat, 35-60% energy from carbohydrates, less than 200mg  of dietary cholesterol, 20-35 gm of total fiber daily;Understanding of distribution of calorie intake throughout the day with the consumption of 4-5 meals/snacks   Improve shortness of breath with ADL's Yes   Intervention Provide education, individualized exercise plan and daily activity instruction to help decrease symptoms of SOB with activities of daily  living.   Expected Outcomes Short Term: Achieves a reduction of symptoms when performing activities of daily living.   Diabetes Yes   Intervention Provide education about signs/symptoms and action to take for hypo/hyperglycemia.;Provide education about proper nutrition, including hydration, and aerobic/resistive exercise prescription along with prescribed medications to achieve blood glucose in normal ranges: Fasting glucose 65-99 mg/dL   Expected Outcomes Short Term: Participant verbalizes understanding of the signs/symptoms and immediate care of hyper/hypoglycemia, proper foot care and importance of medication, aerobic/resistive exercise and nutrition plan for blood glucose control.;Long Term: Attainment of HbA1C < 7%.   Heart Failure Yes   Intervention Provide a combined exercise and nutrition program that is supplemented with education, support and counseling about heart failure. Directed toward relieving symptoms such as shortness of breath, decreased exercise tolerance, and extremity edema.   Expected Outcomes Short term: Attendance in program 2-3 days a week with increased exercise capacity. Reported lower sodium intake. Reported increased fruit and vegetable intake. Reports medication compliance.;Short term: Daily weights obtained and reported for increase. Utilizing diuretic protocols set by physician.;Long term: Adoption of self-care skills and reduction of barriers for early signs and symptoms recognition and intervention leading to self-care maintenance.;Improve functional capacity of life   Lipids Yes   Intervention Provide education and support for participant on nutrition & aerobic/resistive exercise along with prescribed medications to achieve LDL 70mg , HDL >40mg .   Expected Outcomes Short Term: Participant states understanding of desired cholesterol values and is compliant with medications prescribed. Participant is following exercise prescription and nutrition guidelines.;Long Term:  Cholesterol controlled with medications as prescribed, with individualized exercise RX and with personalized nutrition plan. Value goals: LDL < 70mg , HDL > 40 mg.   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 With his health issues and responsibility for daughters and school, it can be difficult at times   Intervention Offer individual and/or small group education and counseling on adjustment to heart disease, stress management and health-related lifestyle change. Teach and support self-help strategies.;Refer participants experiencing significant psychosocial distress to appropriate mental health specialists for further evaluation and treatment. When possible, include family members and significant others in education/counseling sessions.   Expected Outcomes Short Term: Participant demonstrates changes in health-related behavior, relaxation and other stress management skills, ability to obtain effective social support, and compliance with psychotropic medications if prescribed.;Long Term: Emotional wellbeing is indicated by absence of clinically significant psychosocial distress or social isolation.      Tobacco Use Initial Evaluation: History  Smoking Status  . Never Smoker  Smokeless Tobacco  . Never Used    Copy of goals given to participant.
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
## 2017-05-17 ENCOUNTER — Telehealth: Payer: Self-pay | Admitting: *Deleted

## 2017-05-17 NOTE — Telephone Encounter (Signed)
Philip Cook called to let us know that he would not be here today.  He is having stomach issues and hopes to return on Wednesday.

## 2017-05-19 ENCOUNTER — Encounter: Payer: BC Managed Care – PPO | Admitting: *Deleted

## 2017-05-19 DIAGNOSIS — I5032 Chronic diastolic (congestive) heart failure: Secondary | ICD-10-CM

## 2017-05-19 DIAGNOSIS — I110 Hypertensive heart disease with heart failure: Secondary | ICD-10-CM | POA: Diagnosis not present

## 2017-05-19 LAB — GLUCOSE, CAPILLARY: Glucose-Capillary: 115 mg/dL — ABNORMAL HIGH (ref 65–99)
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
## 2017-05-19 NOTE — Progress Notes (Signed)
Daily Session Note  Patient Details  Name: Philip Cook MRN: [id number] Date of Birth: 07/07/1961 Referring Provider:     Cardiac Rehab from 05/13/2017 in [facility] Cardiac and Pulmonary Rehab  Referring Provider  Paraschos      Encounter Date: 05/19/2017  Check In:     Session Check In - 05/19/17 1752      Check-In   Location [facility]-Cardiac & Pulmonary Rehab   Staff Present Nyoka Cowden, RN, BSN, MA;Stormie Ventola, RN, Vickki Hearing, BA, ACSM CEP, Exercise Physiologist   Supervising physician immediately available to respond to emergencies See telemetry face sheet for immediately available ER MD   Medication changes reported     No   Fall or balance concerns reported    No   Tobacco Cessation No Change   Warm-up and Cool-down Performed on first and last piece of equipment   Resistance Training Performed Yes   VAD Patient? No     Pain Assessment   Currently in Pain? No/denies         History  Smoking Status  . Never Smoker  Smokeless Tobacco  . Never Used    Goals Met:  Proper associated with RPD/PD & O2 Sat Exercise tolerated well No report of cardiac concerns or symptoms Strength training completed today  Goals Unmet:  Not Applicable  Comments: First full day of exercise!  Patient was oriented to gym and equipment including functions, settings, policies, and procedures.  Patient's individual exercise prescription and treatment plan were reviewed.  All starting workloads were established based on the results of the 6 minute walk test done at initial orientation visit.  The plan for exercise progression was also introduced and progression will be customized based on patient's performance and goals. Reviewed RPE scale, THR and program prescription with pt today.  Pt voiced understanding and was given a copy of goals to take home.   Short: Use RPE daily to regulate intensity.  Long: Follow program prescription in THR.  Dr. Emily Filbert is Medical Director  for [place] and [facility].

## 2017-05-20 DIAGNOSIS — I5032 Chronic diastolic (congestive) heart failure: Secondary | ICD-10-CM

## 2017-05-20 DIAGNOSIS — I110 Hypertensive heart disease with heart failure: Secondary | ICD-10-CM | POA: Diagnosis not present

## 2017-05-20 LAB — GLUCOSE, CAPILLARY
Glucose-Capillary: 162 mg/dL — ABNORMAL HIGH (ref 65–99)
Glucose-Capillary: 94 mg/dL (ref 65–99)

## 2017-05-20 NOTE — Progress Notes (Signed)
Daily Session Note  Patient Details  Name: Philip Cook MRN: [id number] Date of Birth: 1961-02-10 Referring Provider:     Cardiac Rehab from 05/13/2017 in [facility] Cardiac and [facility] Rehab  Referring Provider  Paraschos      Encounter Date: 05/20/2017  Check In:     Session Check In - 05/20/17 1638      Check-In   Location [facility]Cardiac & Pulmonary Rehab   Staff Present Earlean Shawl, BS, ACSM CEP, Exercise Physiologist;Chael Urenda Tessie Fass RCP,RRT,BSRT;Carroll Enterkin, RN, BSN   Supervising physician immediately available to respond to emergencies See telemetry face sheet for immediately available ER MD   Medication changes reported     No   Fall or balance concerns reported    No   Warm-up and Cool-down Performed on first and last piece of equipment   Resistance Training Performed Yes   VAD Patient? No     Pain Assessment   Currently in Pain? No/denies   Multiple Pain Sites No         History  Smoking Status  . Never Smoker  Smokeless Tobacco  . Never Used    Goals Met:  Independence with exercise equipment Exercise tolerated well No report of cardiac concerns or symptoms Strength training completed today  Goals Unmet:  Not Applicable  Comments: Pt able to follow exercise prescription today without complaint.  Will continue to monitor for progression.   Dr. Emily Filbert is Medical Director for [place] and [facility].

## 2017-05-21 ENCOUNTER — Ambulatory Visit: Payer: BC Managed Care – PPO | Attending: Family | Admitting: Family

## 2017-05-21 ENCOUNTER — Encounter: Payer: Self-pay | Admitting: Family

## 2017-05-21 VITALS — BP 127/60 | HR 61 | Resp 20 | Ht 70.0 in | Wt 320.1 lb

## 2017-05-21 DIAGNOSIS — Z79899 Other long term (current) drug therapy: Secondary | ICD-10-CM | POA: Diagnosis not present

## 2017-05-21 DIAGNOSIS — Z955 Presence of coronary angioplasty implant and graft: Secondary | ICD-10-CM | POA: Insufficient documentation

## 2017-05-21 DIAGNOSIS — Z9889 Other specified postprocedural states: Secondary | ICD-10-CM | POA: Diagnosis not present

## 2017-05-21 DIAGNOSIS — E119 Type 2 diabetes mellitus without complications: Secondary | ICD-10-CM | POA: Insufficient documentation

## 2017-05-21 DIAGNOSIS — E785 Hyperlipidemia, unspecified: Secondary | ICD-10-CM | POA: Insufficient documentation

## 2017-05-21 DIAGNOSIS — I110 Hypertensive heart disease with heart failure: Secondary | ICD-10-CM | POA: Insufficient documentation

## 2017-05-21 DIAGNOSIS — R079 Chest pain, unspecified: Secondary | ICD-10-CM | POA: Diagnosis not present

## 2017-05-21 DIAGNOSIS — Z8249 Family history of ischemic heart disease and other diseases of the circulatory system: Secondary | ICD-10-CM | POA: Insufficient documentation

## 2017-05-21 DIAGNOSIS — M25511 Pain in right shoulder: Secondary | ICD-10-CM | POA: Insufficient documentation

## 2017-05-21 DIAGNOSIS — R0602 Shortness of breath: Secondary | ICD-10-CM | POA: Insufficient documentation

## 2017-05-21 DIAGNOSIS — I5032 Chronic diastolic (congestive) heart failure: Secondary | ICD-10-CM | POA: Diagnosis not present

## 2017-05-21 DIAGNOSIS — I2510 Atherosclerotic heart disease of native coronary artery without angina pectoris: Secondary | ICD-10-CM | POA: Insufficient documentation

## 2017-05-21 DIAGNOSIS — I10 Essential (primary) hypertension: Secondary | ICD-10-CM

## 2017-05-21 DIAGNOSIS — G4733 Obstructive sleep apnea (adult) (pediatric): Secondary | ICD-10-CM | POA: Diagnosis not present

## 2017-05-21 DIAGNOSIS — Z882 Allergy status to sulfonamides status: Secondary | ICD-10-CM | POA: Insufficient documentation

## 2017-05-21 DIAGNOSIS — Z7982 Long term (current) use of aspirin: Secondary | ICD-10-CM | POA: Insufficient documentation

## 2017-05-21 DIAGNOSIS — E109 Type 1 diabetes mellitus without complications: Secondary | ICD-10-CM

## 2017-05-21 NOTE — Patient Instructions (Signed)
Continue weighing daily and call for an overnight weight gain of > 2 pounds or a weekly weight gain of >5 pounds. 

## 2017-05-21 NOTE — Progress Notes (Signed)
Patient ID: Philip Cook, male    DOB: January 19, 1961, 56 y.o.   MRN: [id number]  HPI  Mr Route is a 56 y/o male with a history of HTN, hyperlipidemia, diabetes, CAD, obstructive sleep apnea and chronic heart failure.   Reviewed echo/stress test report from 12/11/16 which showed an EF of 55% along with trivial MR/TR. Normal stress ECG results. Had a cardiac catheterization dene 04/07/17 which showed normal left ventricular function of 60%, patent proximal LAD stent, diagonal 1 and circumflex with moderate disease and RCA relatively free of disease.   Admitted 04/05/17 with chest pain and heart failure. Initially given IV diuretics. Cardiology consult obtained and catheterization done. Medication management was recommended and he was discharged home after 2 days.   He presents today for his follow-up visit with a chief complaint of mild shortness of breath upon moderate exertion. He describes this as chronic having been present for many months although he does feel like his breathing has improved. He has associated fatigue and edema along with this. Denies any weight gain.   Past Medical History:  Diagnosis Date  . CHF (congestive heart failure) ([place])   . Coronary artery disease   . Diabetes mellitus without complication ([place])   . Hyperlipidemia   . Hypertension    Past Surgical History:  Procedure Laterality Date  . CORONARY ANGIOPLASTY WITH STENT PLACEMENT    . LEFT HEART CATH AND CORONARY ANGIOGRAPHY N/A 04/07/2017   Procedure: Left Heart Cath and Coronary Angiography and possible PCI;  Surgeon: Yolonda Kida, MD;  Location: [facility];  Service: Cardiovascular;  Laterality: N/A;   Family History  Problem Relation Age of Onset  . CAD Father   . Heart failure Brother    Social History  Substance Use Topics  . Smoking status: Never Smoker  . Smokeless tobacco: Never Used  . Alcohol use No   Allergies  Allergen Reactions  . Sulfa Antibiotics Rash   Prior to  Admission medications   Medication Sig Start Date End Date Taking? Authorizing Provider  aspirin 325 MG tablet Take 325 mg by mouth daily.   Yes [provider]  furosemide (LASIX) 20 MG tablet Take 1 tablet (20 mg total) by mouth 2 (two) times daily. 04/07/17 04/07/18 Yes Dustin Flock, MD  Insulin Human (INSULIN PUMP) SOLN Inject into the skin.   Yes [provider]  isosorbide mononitrate (IMDUR) 30 MG 24 hr tablet Take 1 tablet (30 mg total) by mouth daily. 04/07/17  Yes Dustin Flock, MD  meloxicam (MOBIC) 7.5 MG tablet Take 7.5 mg by mouth daily.   Yes [provider]  nebivolol (BYSTOLIC) 10 MG tablet Take 10 mg by mouth daily.   Yes [provider]  prasugrel (EFFIENT) 10 MG TABS tablet TAKE ONE TABLET BY MOUTH EVERY DAY 01/04/17  Yes [provider]  rosuvastatin (CRESTOR) 10 MG tablet Take 1 tablet (10 mg total) by mouth daily. 04/08/17  Yes Dustin Flock, MD   Review of Systems  Constitutional: Positive for fatigue. Negative for appetite change.  HENT: Negative for congestion, postnasal drip and sore throat.   Eyes: Negative.   Respiratory: Positive for shortness of breath (improving). Negative for chest tightness.   Cardiovascular: Positive for leg swelling (improving). Negative for chest pain and palpitations.  Gastrointestinal: Negative for abdominal distention and abdominal pain.  Endocrine: Negative.   Genitourinary: Negative.   Musculoskeletal: Positive for arthralgias (right shoulder). Negative for back pain.  Skin: Negative.   Allergic/Immunologic:  Negative.   Neurological: Negative for dizziness and light-headedness.  Hematological: Negative for adenopathy. Does not bruise/bleed easily.  Psychiatric/Behavioral: Positive for dysphoric mood (better since excercising). Negative for sleep disturbance (wearing CPAP nightly). The patient is not nervous/anxious.    Vitals:   05/21/17 1143  BP: 127/60  Pulse: 61  Resp: 20   SpO2: 99%  Weight: (!) 320 lb 2 oz (145.2 kg)  Height: 5\' 10"  (1.778 m)   Wt Readings from Last 3 Encounters:  05/21/17 (!) 320 lb 2 oz (145.2 kg)  05/13/17 (!) 325 lb 11.2 oz (147.7 kg)  04/19/17 (!) 320 lb 2 oz (145.2 kg)    Lab Results  Component Value Date   CREATININE 0.91 04/07/2017   CREATININE 0.82 04/06/2017   CREATININE 0.90 04/05/2017    Physical Exam  Constitutional: He is oriented to person, place, and time. He appears well-developed and well-nourished.  HENT:  Head: Normocephalic and atraumatic.  Neck: Normal range of motion. Neck supple. No JVD present.  Cardiovascular: Normal rate and regular rhythm.   Pulmonary/Chest: Effort normal. He has no wheezes. He has no rales.  Abdominal: Soft. He exhibits no distension. There is no tenderness.  Musculoskeletal: He exhibits edema (2+ edema in left lower leg). He exhibits no tenderness.  Neurological: He is alert and oriented to person, place, and time.  Skin: Skin is warm and dry.  Psychiatric: He has a normal mood and affect. His behavior is normal. Thought content normal.  Nursing note and vitals reviewed.  Assessment & Plan:  1: Chronic heart failure with preserved ejection fraction- - NYHA class II - euvolemic today - continues to weigh daily and says that he had gained some weight because he got back into bad eating habits. Has restructured his diet and dropped the pounds he had picked up.  Instructed to call for an overnight weight gain of >2 pounds or a weekly weight gain of >5 pounds - not adding salt to his food and has been reading food labels. Not eating out as much as he's been eating more at home - has more edema in his legs than previously - does wear TED hose but wears them at bedtime because he doesn't like wearing them to work when he wears shorts. Explained how they would work better if worn when he's up moving around, he says that he'll put them on as soon as he gets home from work & then take them  off in the morning. Once it gets cold out and he wears pants to work, he'll wear them during the day.  - does elevate his legs when he gets home from work which does help the edema - saw cardiologist (Paraschos) 04/19/17 - continues to particpate in [place] - does not meet ReDS vest criteria due to BMI  2: DM- - drinking glucerna - glucose this morning was 84 - saw endocrinologist ([facility]) 05/14/17 - A1c on 05/07/17 was 8.0%  3: HTN- - BP looks good today - saw PCP ([place]) 01/01/17 - BMP on 04/07/17 reviewed and showed sodium 142, potassium 3.5 and GFR >60  4: Obstructive sleep apnea- - wearing CPAP nightly - sleeps in a recliner due to pain in his right shoulder  Patient did not bring his medications nor a list. Each medication was verbally reviewed with the patient and he was encouraged to bring the bottles to every visit to confirm accuracy of list.  Return in 3 months or sooner for any questions/problems before then.

## 2017-05-26 ENCOUNTER — Telehealth: Payer: Self-pay

## 2017-05-26 ENCOUNTER — Encounter: Payer: BC Managed Care – PPO | Attending: Cardiology

## 2017-05-26 DIAGNOSIS — E785 Hyperlipidemia, unspecified: Secondary | ICD-10-CM | POA: Insufficient documentation

## 2017-05-26 DIAGNOSIS — I2510 Atherosclerotic heart disease of native coronary artery without angina pectoris: Secondary | ICD-10-CM | POA: Insufficient documentation

## 2017-05-26 DIAGNOSIS — I110 Hypertensive heart disease with heart failure: Secondary | ICD-10-CM | POA: Insufficient documentation

## 2017-05-26 DIAGNOSIS — Z7982 Long term (current) use of aspirin: Secondary | ICD-10-CM | POA: Insufficient documentation

## 2017-05-26 DIAGNOSIS — Z79899 Other long term (current) drug therapy: Secondary | ICD-10-CM | POA: Insufficient documentation

## 2017-05-26 DIAGNOSIS — Z9641 Presence of insulin pump (external) (internal): Secondary | ICD-10-CM | POA: Insufficient documentation

## 2017-05-26 DIAGNOSIS — I5032 Chronic diastolic (congestive) heart failure: Secondary | ICD-10-CM | POA: Insufficient documentation

## 2017-05-26 NOTE — Telephone Encounter (Signed)
Cannot attend class today - stuck at work

## 2017-05-27 DIAGNOSIS — Z9641 Presence of insulin pump (external) (internal): Secondary | ICD-10-CM | POA: Diagnosis not present

## 2017-05-27 DIAGNOSIS — E785 Hyperlipidemia, unspecified: Secondary | ICD-10-CM | POA: Diagnosis not present

## 2017-05-27 DIAGNOSIS — I5032 Chronic diastolic (congestive) heart failure: Secondary | ICD-10-CM | POA: Diagnosis present

## 2017-05-27 DIAGNOSIS — Z7982 Long term (current) use of aspirin: Secondary | ICD-10-CM | POA: Diagnosis not present

## 2017-05-27 DIAGNOSIS — Z79899 Other long term (current) drug therapy: Secondary | ICD-10-CM | POA: Diagnosis not present

## 2017-05-27 DIAGNOSIS — I110 Hypertensive heart disease with heart failure: Secondary | ICD-10-CM | POA: Diagnosis not present

## 2017-05-27 DIAGNOSIS — I2510 Atherosclerotic heart disease of native coronary artery without angina pectoris: Secondary | ICD-10-CM | POA: Diagnosis not present

## 2017-05-27 NOTE — Progress Notes (Signed)
Daily Session Note  Patient Details  Name: LEEON MAKAR MRN: [id number] Date of Birth: 1961/03/19 Referring Provider:     Cardiac Rehab from 05/13/2017 in [facility] Cardiac and Pulmonary Rehab  Referring Provider  Paraschos      Encounter Date: 05/27/2017  Check In:     Session Check In - 05/27/17 1624      Check-In   Location [facility]-Cardiac & Pulmonary Rehab   Staff Present Gerlene Burdock, RN, Vickki Hearing, BA, ACSM CEP, Exercise Physiologist;Kelly Amedeo Plenty, BS, ACSM CEP, Exercise Physiologist;Milessa Hogan Flavia Shipper   Supervising physician immediately available to respond to emergencies See telemetry face sheet for immediately available ER MD   Medication changes reported     No   Fall or balance concerns reported    No   Warm-up and Cool-down Performed on first and last piece of equipment   Resistance Training Performed Yes   VAD Patient? No     Pain Assessment   Currently in Pain? No/denies   Multiple Pain Sites No         History  Smoking Status  . Never Smoker  Smokeless Tobacco  . Never Used    Goals Met:  Independence with exercise equipment Exercise tolerated well No report of cardiac concerns or symptoms Strength training completed today  Goals Unmet:  Not Applicable  Comments: Pt able to follow exercise prescription today without complaint.  Will continue to monitor for progression.   Dr. Emily Filbert is Medical Director for [place] and [facility].

## 2017-05-31 DIAGNOSIS — I5032 Chronic diastolic (congestive) heart failure: Secondary | ICD-10-CM

## 2017-05-31 DIAGNOSIS — I110 Hypertensive heart disease with heart failure: Secondary | ICD-10-CM | POA: Diagnosis not present

## 2017-05-31 LAB — GLUCOSE, CAPILLARY: Glucose-Capillary: 97 mg/dL (ref 65–99)

## 2017-05-31 NOTE — Progress Notes (Signed)
Daily Session Note  Patient Details  Name: Philip Cook MRN: [id number] Date of Birth: [birth year] Referring Provider:     Cardiac Rehab from 05/13/2017 in [facility] Cardiac and Pulmonary Rehab  Referring Provider  Paraschos      Encounter Date: 05/31/2017  Check In:     Session Check In - 05/31/17 1725      Check-In   Location [facility]Card[facility] & Pul[facility] Re[facility]   Staff Present Nada Maclachlan, BA, ACSM CEP, Exercise Physiologist;Kelly Amedeo Plenty, BS, ACSM CEP, Exercise Physiologist;Meredith Sherryll Burger, RN BSN   Supervising physician immediately available to respond to emergencies See telemetry face sheet for immediately available ER MD   Medication changes reported     No   Fall or balance concerns reported    No   Warm-up and Cool-down Performed on first and last piece of equipment   Resistance Training Performed Yes   VAD Patient? No     Pain Assessment   Currently in Pain? No/denies         History  Smoking Status  . Never Smoker  Smokeless Tobacco  . Never Used    Goals Met:  Independence with exercise equipment Exercise tolerated well No report of cardiac concerns or symptoms Strength training completed today  Goals Unmet:  Not Applicable  Comments: Pt able to follow exercise prescription today without complaint.  Will continue to monitor for progression.    Dr. Emily Filbert is Medical Director for [place] and [facility].

## 2017-06-02 ENCOUNTER — Encounter: Payer: Self-pay | Admitting: *Deleted

## 2017-06-02 ENCOUNTER — Encounter: Payer: BC Managed Care – PPO | Admitting: *Deleted

## 2017-06-02 DIAGNOSIS — I110 Hypertensive heart disease with heart failure: Secondary | ICD-10-CM | POA: Diagnosis not present

## 2017-06-02 DIAGNOSIS — I5032 Chronic diastolic (congestive) heart failure: Secondary | ICD-10-CM

## 2017-06-02 NOTE — Progress Notes (Signed)
Daily Session Note  Patient Details  Name: Philip Cook MRN: [id number] Date of Birth: 03-11-1961 Referring Provider:     Cardiac Rehab from 05/13/2017 in [facility] Cardiac and Pulmonary Rehab  Referring Provider  Paraschos      Encounter Date: 06/02/2017  Check In:     Session Check In - 06/02/17 1701      Check-In   Location [facility]-Cardiac & Pulmonary Rehab   Staff Present Renita Papa, RN BSN;Carroll Enterkin, RN, Vickki Hearing, BA, ACSM CEP, Exercise Physiologist   Supervising physician immediately available to respond to emergencies See telemetry face sheet for immediately available ER MD   Medication changes reported     No   Fall or balance concerns reported    No   Warm-up and Cool-down Performed on first and last piece of equipment   Resistance Training Performed Yes   VAD Patient? No     Pain Assessment   Currently in Pain? No/denies         History  Smoking Status  . Never Smoker  Smokeless Tobacco  . Never Used    Goals Met:  Proper associated with RPD/PD & O2 Sat Independence with exercise equipment Exercise tolerated well No report of cardiac concerns or symptoms Strength training completed today  Goals Unmet:  Not Applicable  Comments: Pt able to follow exercise prescription today without complaint.  Will continue to monitor for progression.    Dr. Emily Filbert is Medical Director for [place] and [facility].

## 2017-06-02 NOTE — Progress Notes (Signed)
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 starting workloads were established based on the results of the 6 minute walk test done at initial orientation visit.  The plan for exercise progression was also introduced and progression will be customized based on patient's performance and goals. Reviewed RPE scale, THR and program prescription with pt today.  Pt voiced understanding and was given a copy of goals to take home.  30 day review. Continue with ITP unless directed changes per Medical Director review.         Comments:

## 2017-06-07 DIAGNOSIS — I110 Hypertensive heart disease with heart failure: Secondary | ICD-10-CM | POA: Diagnosis not present

## 2017-06-07 DIAGNOSIS — I5032 Chronic diastolic (congestive) heart failure: Secondary | ICD-10-CM

## 2017-06-07 NOTE — Progress Notes (Signed)
Daily Session Note  Patient Details  Name: Philip Cook MRN: [id number] Date of Birth: 1961/09/11 Referring Provider:     Cardiac Rehab from 05/13/2017 in [facility] Cardiac and Pulmonary Rehab  Referring Provider  Paraschos      Encounter Date: 06/07/2017  Check In:     Session Check In - 06/07/17 1715      Check-In   Location [facility]-Cardiac & Pulmonary Rehab   Staff Present Nada Maclachlan, BA, ACSM CEP, Exercise Physiologist;Kelly Amedeo Plenty, BS, ACSM CEP, Exercise Physiologist;Meredith Sherryll Burger, RN BSN   Supervising physician immediately available to respond to emergencies See telemetry face sheet for immediately available ER MD   Medication changes reported     No   Fall or balance concerns reported    No   Warm-up and Cool-down Performed on first and last piece of equipment   Resistance Training Performed Yes   VAD Patient? No     Pain Assessment   Currently in Pain? No/denies         History  Smoking Status  . Never Smoker  Smokeless Tobacco  . Never Used    Goals Met:  Independence with exercise equipment Exercise tolerated well No report of cardiac concerns or symptoms Strength training completed today  Goals Unmet:  Not Applicable  Comments: Pt able to follow exercise prescription today without complaint.  Will continue to monitor for progression.    Dr. Emily Filbert is Medical Director for [place] and [facility].

## 2017-06-09 ENCOUNTER — Encounter: Payer: BC Managed Care – PPO | Admitting: *Deleted

## 2017-06-09 DIAGNOSIS — I110 Hypertensive heart disease with heart failure: Secondary | ICD-10-CM | POA: Diagnosis not present

## 2017-06-09 DIAGNOSIS — I5032 Chronic diastolic (congestive) heart failure: Secondary | ICD-10-CM

## 2017-06-09 NOTE — Progress Notes (Signed)
Daily Session Note  Patient Details  Name: Philip Cook MRN: [id number] Date of Birth: [birth year] Referring Provider:     Cardiac Rehab from 05/13/2017 in [facility] Cardiac and Pulmonary Rehab  Referring Provider  Paraschos      Encounter Date: 06/09/2017  Check In:     Session Check In - 06/09/17 1623      Check-In   Location [facility]-Cardiac & Pulmonary Rehab   Staff Present Renita Papa, RN BSN;Carroll Enterkin, RN, Vickki Hearing, BA, ACSM CEP, Exercise Physiologist   Supervising physician immediately available to respond to emergencies See telemetry face sheet for immediately available ER MD   Medication changes reported     No   Fall or balance concerns reported    No   Warm-up and Cool-down Performed on first and last piece of equipment   Resistance Training Performed Yes   VAD Patient? No     Pain Assessment   Currently in Pain? No/denies           Exercise Prescription Changes - 06/09/17 1200      Response to Exercise   Blood Pressure (Admit) 120/70   Blood Pressure (Exercise) 162/80   Blood Pressure (Exit) 122/82   Heart Rate (Admit) 70 bpm   Heart Rate (Exercise) 114 bpm   Heart Rate (Exit) 69 bpm   Rating of Perceived Exertion (Exercise) 15   Symptoms none   Duration Continue with 45 min of aerobic exercise without signs/symptoms of physical distress.   Intensity THRR unchanged     Progression   Progression Continue to progress workloads to maintain intensity without signs/symptoms of physical distress.   Average METs 3.92     Resistance Training   Training Prescription Yes   Weight 3   Reps 10-15     Interval Training   Interval Training No     Treadmill   MPH 3   Grade 3   Minutes 15   METs 4.54     Arm Ergometer   Level 1   Minutes 15   METs 3.3      History  Smoking Status  . Never Smoker  Smokeless Tobacco  . Never Used    Goals Met:  Proper associated with RPD/PD & O2 Sat Independence with exercise equipment Exercise  tolerated well No report of cardiac concerns or symptoms Strength training completed today  Goals Unmet:  Not Applicable  Comments: Pt able to follow exercise prescription today without complaint.  Will continue to monitor for progression.    Dr. Emily Filbert is Medical Director for [place] and [facility].

## 2017-06-14 DIAGNOSIS — I5032 Chronic diastolic (congestive) heart failure: Secondary | ICD-10-CM

## 2017-06-14 DIAGNOSIS — I110 Hypertensive heart disease with heart failure: Secondary | ICD-10-CM | POA: Diagnosis not present

## 2017-06-14 NOTE — Progress Notes (Signed)
Daily Session Note  Patient Details  Name: Philip Cook MRN: [id number] Date of Birth: December 06, 1960 Referring Provider:     Cardiac Rehab from 05/13/2017 in [facility] Cardiac and Pulmonary Rehab  Referring Provider  Paraschos      Encounter Date: 06/14/2017  Check In:     Session Check In - 06/14/17 1722      Check-In   Location [facility]-Cardiac & Pulmonary Rehab   Staff Present Nada Maclachlan, BA, ACSM CEP, Exercise Physiologist;Kelly Amedeo Plenty, BS, ACSM CEP, Exercise Physiologist;Meredith Sherryll Burger, RN BSN   Supervising physician immediately available to respond to emergencies See telemetry face sheet for immediately available ER MD   Medication changes reported     No   Fall or balance concerns reported    No   Warm-up and Cool-down Performed on first and last piece of equipment   Resistance Training Performed Yes   VAD Patient? No     Pain Assessment   Currently in Pain? No/denies         History  Smoking Status  . Never Smoker  Smokeless Tobacco  . Never Used    Goals Met:  Independence with exercise equipment Exercise tolerated well No report of cardiac concerns or symptoms Strength training completed today  Goals Unmet:  Not Applicable  Comments: Pt able to follow exercise prescription today without complaint.  Will continue to monitor for progression.    Dr. Emily Filbert is Medical Director for [place] and [facility].

## 2017-06-17 ENCOUNTER — Encounter: Payer: BC Managed Care – PPO | Admitting: *Deleted

## 2017-06-17 DIAGNOSIS — I5032 Chronic diastolic (congestive) heart failure: Secondary | ICD-10-CM

## 2017-06-17 DIAGNOSIS — I110 Hypertensive heart disease with heart failure: Secondary | ICD-10-CM | POA: Diagnosis not present

## 2017-06-17 NOTE — Progress Notes (Signed)
Daily Session Note  Patient Details  Name: ABDOU STOCKS MRN: [id number] Date of Birth: 1961/02/09 Referring Provider:     Cardiac Rehab from 05/13/2017 in [facility] Cardiac and Pulmonary Rehab  Referring Provider  Paraschos      Encounter Date: 06/17/2017  Check In:     Session Check In - 06/17/17 1721      Check-In   Location [facility]-Cardiac & Pulmonary Rehab   Staff Present Earlean Shawl, BS, ACSM CEP, Exercise Physiologist;Joseph Tessie Fass RCP,RRT,BSRT;Carroll Enterkin, RN, BSN   Supervising physician immediately available to respond to emergencies See telemetry face sheet for immediately available ER MD   Medication changes reported     No   Fall or balance concerns reported    No   Warm-up and Cool-down Performed on first and last piece of equipment   Resistance Training Performed Yes   VAD Patient? No     Pain Assessment   Currently in Pain? No/denies   Multiple Pain Sites No         History  Smoking Status  . Never Smoker  Smokeless Tobacco  . Never Used    Goals Met:  Independence with exercise equipment Exercise tolerated well No report of cardiac concerns or symptoms Strength training completed today  Goals Unmet:  Not Applicable  Comments: Pt able to follow exercise prescription today without complaint.  Will continue to monitor for progression.    Dr. Emily Filbert is Medical Director for [facility] and [facility].

## 2017-06-21 ENCOUNTER — Encounter: Payer: BC Managed Care – PPO | Attending: Cardiology

## 2017-06-21 DIAGNOSIS — E785 Hyperlipidemia, unspecified: Secondary | ICD-10-CM | POA: Insufficient documentation

## 2017-06-21 DIAGNOSIS — Z9641 Presence of insulin pump (external) (internal): Secondary | ICD-10-CM | POA: Insufficient documentation

## 2017-06-21 DIAGNOSIS — I5032 Chronic diastolic (congestive) heart failure: Secondary | ICD-10-CM | POA: Insufficient documentation

## 2017-06-21 DIAGNOSIS — I110 Hypertensive heart disease with heart failure: Secondary | ICD-10-CM | POA: Diagnosis not present

## 2017-06-21 DIAGNOSIS — Z79899 Other long term (current) drug therapy: Secondary | ICD-10-CM | POA: Diagnosis not present

## 2017-06-21 DIAGNOSIS — I2510 Atherosclerotic heart disease of native coronary artery without angina pectoris: Secondary | ICD-10-CM | POA: Diagnosis not present

## 2017-06-21 DIAGNOSIS — Z7982 Long term (current) use of aspirin: Secondary | ICD-10-CM | POA: Insufficient documentation

## 2017-06-21 NOTE — Progress Notes (Signed)
Daily Session Note  Patient Details  Name: Philip Cook MRN: [id number] Date of Birth: 1961-01-15 Referring Provider:     Cardiac Rehab from 05/13/2017 in [facility] Cardiac and Pulmonary Rehab  Referring Provider  Paraschos      Encounter Date: 06/21/2017  Check In:     Session Check In - 06/21/17 1713      Check-In   Location [facility]-Cardiac & Pulmonary Rehab   Staff Present Nyoka Cowden, RN, BSN, Kela Millin, BA, ACSM CEP, Exercise Physiologist;Kelly Amedeo Plenty, BS, ACSM CEP, Exercise Physiologist   Supervising physician immediately available to respond to emergencies See telemetry face sheet for immediately available ER MD   Medication changes reported     No   Fall or balance concerns reported    No   Warm-up and Cool-down Performed on first and last piece of equipment   Resistance Training Performed Yes   VAD Patient? No     Pain Assessment   Currently in Pain? No/denies         History  Smoking Status  . Never Smoker  Smokeless Tobacco  . Never Used    Goals Met:  Independence with exercise equipment Exercise tolerated well No report of cardiac concerns or symptoms Strength training completed today  Goals Unmet:  Not Applicable  Comments: Pt able to follow exercise prescription today without complaint.  Will continue to monitor for progression.    Dr. Emily Filbert is Medical Director for [place] and [facility].

## 2017-06-24 ENCOUNTER — Telehealth: Payer: Self-pay | Admitting: *Deleted

## 2017-06-24 NOTE — Telephone Encounter (Signed)
Philip Cook called to inform staff that he will not be in class 10/4 due to stomach issues. He will also not be able to attend class 10/8 because his daughter has a softball game.

## 2017-06-25 ENCOUNTER — Telehealth: Payer: Self-pay | Admitting: *Deleted

## 2017-06-25 NOTE — Telephone Encounter (Signed)
I left a vm to follow up with Philip Cook about his stomach issues yesterday and also to see how Cardiac Rehab is going for him in general (ie. Short term goal to lose weight to 233lbs etc.). Follow up if he has any questions about his diabetes.

## 2017-06-30 ENCOUNTER — Encounter: Payer: Self-pay | Admitting: *Deleted

## 2017-06-30 ENCOUNTER — Encounter: Payer: BC Managed Care – PPO | Admitting: *Deleted

## 2017-06-30 DIAGNOSIS — I5032 Chronic diastolic (congestive) heart failure: Secondary | ICD-10-CM

## 2017-06-30 DIAGNOSIS — I110 Hypertensive heart disease with heart failure: Secondary | ICD-10-CM | POA: Diagnosis not present

## 2017-06-30 NOTE — Progress Notes (Signed)
Daily Session Note  Patient Details  Name: Philip Cook MRN: [id number] Date of Birth: [birth year] Referring Provider:     Cardiac Rehab from 05/13/2017 in [facility] Cardiac and Pulmonary Rehab  Referring Provider  Paraschos      Encounter Date: 06/30/2017  Check In:     Session Check In - 06/30/17 1623      Check-In   Location [facility]-Cardiac & Pulmonary Rehab   Staff Present Renita Papa, RN Vickki Hearing, BA, ACSM CEP, Exercise Physiologist;Carroll Enterkin, RN, BSN   Supervising physician immediately available to respond to emergencies See telemetry face sheet for immediately available ER MD   Medication changes reported     No   Fall or balance concerns reported    No   Warm-up and Cool-down Performed on first and last piece of equipment   Resistance Training Performed Yes     Pain Assessment   Currently in Pain? No/denies         History  Smoking Status  . Never Smoker  Smokeless Tobacco  . Never Used    Goals Met:  Independence with exercise equipment Exercise tolerated well Personal goals reviewed No report of cardiac concerns or symptoms Strength training completed today  Goals Unmet:  Not Applicable  Comments: Pt able to follow exercise prescription today without complaint.  Will continue to monitor for progression. Home exercise reviewed. Patient voiced understanding of plan.     Dr. Emily Filbert is Medical Director for [place] and [facility].

## 2017-06-30 NOTE — Progress Notes (Signed)
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 to assess and modify interventions until short term weight is achieved;Long Term: Adherence to nutrition and physical activity/exercise program aimed toward attainment of established weight goal;Weight Loss: Understanding of general recommendations for a balanced deficit meal plan, which promotes 1-2 lb weight loss per week and includes a negative energy balance of [phone] kcal/d;Understanding recommendations for meals to include 15-35% energy as protein, 25-35% energy from fat, 35-60% energy from carbohydrates, less than 267m of dietary cholesterol, 20-35 gm of total fiber daily;Understanding of distribution of calorie intake throughout the day with the consumption of 4-5 meals/snacks   Improve shortness of breath with ADL's Yes   Intervention Provide education, individualized exercise plan and daily activity instruction to help decrease symptoms of SOB with activities of daily living.   Expected Outcomes Short Term: Achieves a reduction of symptoms when performing activities of daily living.   Diabetes Yes   Intervention Provide education about signs/symptoms and action to take  for hypo/hyperglycemia.;Provide education about proper nutrition, including hydration,  and aerobic/resistive exercise prescription along with prescribed medications to achieve blood glucose in normal ranges: Fasting glucose 65-99 mg/dL   Expected Outcomes Short Term: Participant verbalizes understanding of the signs/symptoms and immediate care of hyper/hypoglycemia, proper foot care and importance of medication, aerobic/resistive exercise and nutrition plan for blood glucose control.;Long Term: Attainment of HbA1C < 7%.   Heart Failure Yes   Intervention Provide a combined exercise and nutrition program that is supplemented with education, support and counseling about heart failure. Directed toward relieving symptoms such as shortness of breath, decreased exercise tolerance, and extremity edema.   Expected Outcomes Short term: Attendance in program 2-3 days a week with increased exercise capacity. Reported lower sodium intake. Reported increased fruit and vegetable intake. Reports medication compliance.;Short term: Daily weights obtained and reported for increase. Utilizing diuretic protocols set by physician.;Long term: Adoption of self-care skills and reduction of barriers for early signs and symptoms recognition and intervention leading to self-care maintenance.;Improve functional capacity of life   Lipids Yes   Intervention Provide education and support for participant on nutrition & aerobic/resistive exercise along with prescribed medications to achieve LDL <14m, HDL >434m   Expected Outcomes Short Term: Participant states understanding of desired cholesterol values and is compliant with medications prescribed. Participant is following exercise prescription and nutrition guidelines.;Long Term: Cholesterol controlled with medications as prescribed, with individualized exercise RX and with personalized nutrition plan. Value goals: LDL < 7012mHDL > 40 mg.   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 With his health issues and responsibility for daughters and school, it can be difficult at times   Intervention Offer individual and/or small group education and counseling on adjustment to heart disease, stress management and health-related lifestyle change. Teach and support self-help strategies.;Refer participants experiencing significant psychosocial distress to appropriate mental health specialists for further evaluation and treatment. When possible, include family members and significant others in education/counseling sessions.   Expected Outcomes Short Term: Participant demonstrates changes in health-related behavior, relaxation and other stress management skills, ability to obtain effective social support, and compliance with psychotropic medications if prescribed.;Long Term: Emotional wellbeing is indicated by absence of clinically significant psychosocial distress or social isolation.      Core Components/Risk Factors/Patient Goals Review:      Goals and Risk Factor Review    Row Name 06/30/17 0812             Core Components/Risk Factors/Patient Goals Review   Personal Goals Review Weight Management/Obesity       Review "313lbs today". "I met with the dietician before they tell me to eat everything I hate. I am losing weight. [facility] [employer] [facility] [place] am not exercising with you folks. I will be in Cardiac Rehab this evening. My daughter has softball practice and games that keeps me from Cardiac Rehab sometimes"       Expected Outcomes Weight below 300lbs. Eating healthier and heart healthy lifetyle.           Core Components/Risk Factors/Patient Goals at Discharge (Final Review):      Goals and Risk Factor Review - 06/30/17 0812      Core Components/Risk Factors/Patient Goals Review   Personal Goals Review Weight Management/Obesity   Review "313lbs today". "I met with the  dietician before they tell me to eat everything I hate. I am losing weight. I go  to [employer] 3 days a week when I am not exercising with you folks. I will be in Cardiac Rehab this evening. My daughter has softball practice and games that keeps me from Cardiac Rehab sometimes"   Expected Outcomes Weight below 300lbs. Eating healthier and heart healthy lifetyle.       ITP Comments:     ITP Comments    Row Name 05/13/17 1313 05/19/17 1753 05/19/17 1758 06/02/17 1135 06/25/17 1323   ITP Comments Med Review completed. Initial ITP created. Diagnosis can be found Care Everywhere 04/19/17 : First full day of exercise!  Patient was oriented to gym and equipment including functions, settings, policies, and procedures.  Patient's individual exercise prescription and treatment plan were reviewed.  All starting workloads were established based on the results of the 6 minute walk test done at initial orientation visit.  The plan for exercise progression was also introduced and progression will be customized based on patient's performance and goals. Reviewed RPE scale, THR and program prescription with pt today.  Pt voiced understanding and was given a copy of goals to take home.  30 day review. Continue with ITP unless directed changes per Medical Director review.   I left a vm to follow up with Gust Rung" Dejonge about his stomach issues yesterday and also to see how Cardiac Rehab is going for him in general (ie. Short term goal to lose weight to 233lbs etc.). Follow up if he has any questions about his diabetes.   [place] Name 06/30/17 0803           ITP Comments 30 Day Review. Continue with ITP unless directed changes per Medical Director Review.           Comments:

## 2017-07-01 DIAGNOSIS — I110 Hypertensive heart disease with heart failure: Secondary | ICD-10-CM | POA: Diagnosis not present

## 2017-07-01 DIAGNOSIS — I5032 Chronic diastolic (congestive) heart failure: Secondary | ICD-10-CM

## 2017-07-01 NOTE — Progress Notes (Signed)
Daily Session Note  Patient Details  Name: Philip Cook MRN: [id number] Date of Birth: 16-Jul-1961 Referring Provider:     Cardiac Rehab from 05/13/2017 in [facility] Cardiac and Pulmonary Rehab  Referring Provider  Paraschos      Encounter Date: 07/01/2017  Check In:     Session Check In - 07/01/17 1625      Check-In   Location [facility]-Cardiac & Pulmonary Rehab   Staff Present Gerlene Burdock, RN, Vickki Hearing, BA, ACSM CEP, Exercise Physiologist;Leina Babe Flavia Shipper   Supervising physician immediately available to respond to emergencies See telemetry face sheet for immediately available ER MD   Medication changes reported     No   Fall or balance concerns reported    No   Warm-up and Cool-down Performed on first and last piece of equipment   Resistance Training Performed Yes   VAD Patient? No     Pain Assessment   Currently in Pain? No/denies   Multiple Pain Sites No           Exercise Prescription Changes - 06/30/17 1700      Home Exercise Plan   Plans to continue exercise at [facility] (comment)  [employer]   Frequency Add 3 additional days to program exercise sessions.   Initial Home Exercises Provided 06/30/17      History  Smoking Status  . Never Smoker  Smokeless Tobacco  . Never Used    Goals Met:  Independence with exercise equipment Exercise tolerated well No report of cardiac concerns or symptoms Strength training completed today  Goals Unmet:  Not Applicable  Comments: Pt able to follow exercise prescription today without complaint.  Will continue to monitor for progression.   Dr. Emily Filbert is Medical Director for [place] and [facility].

## 2017-07-07 DIAGNOSIS — I110 Hypertensive heart disease with heart failure: Secondary | ICD-10-CM | POA: Diagnosis not present

## 2017-07-07 DIAGNOSIS — I5032 Chronic diastolic (congestive) heart failure: Secondary | ICD-10-CM

## 2017-07-07 NOTE — Progress Notes (Signed)
Daily Session Note  Patient Details  Name: Philip Cook MRN: [id number] Date of Birth: Jan 11, 1961 Referring Provider:     Cardiac Rehab from 05/13/2017 in [facility] Cardiac and Pulmonary Rehab  Referring Provider  Paraschos      Encounter Date: 07/07/2017  Check In:     Session Check In - 07/07/17 1627      Check-In   Location [facility]-Cardiac & Pulmonary Rehab   Staff Present Gerlene Burdock, RN, Vickki Hearing, BA, ACSM CEP, Exercise Physiologist;Other  [place] physician immediately available to respond to emergencies See telemetry face sheet for immediately available ER MD   Medication changes reported     No   Fall or balance concerns reported    No   Warm-up and Cool-down Performed on first and last piece of equipment   Resistance Training Performed Yes   VAD Patient? No     Pain Assessment   Currently in Pain? No/denies         History  Smoking Status  . Never Smoker  Smokeless Tobacco  . Never Used    Goals Met:  Independence with exercise equipment Exercise tolerated well No report of cardiac concerns or symptoms Strength training completed today  Goals Unmet:  Not Applicable  Comments: Pt able to follow exercise prescription today without complaint.  Will continue to monitor for progression.    Dr. Emily Filbert is Medical Director for [place] and [facility].

## 2017-07-08 VITALS — Wt 316.3 lb

## 2017-07-08 DIAGNOSIS — I5032 Chronic diastolic (congestive) heart failure: Secondary | ICD-10-CM

## 2017-07-08 DIAGNOSIS — I110 Hypertensive heart disease with heart failure: Secondary | ICD-10-CM | POA: Diagnosis not present

## 2017-07-08 NOTE — Progress Notes (Signed)
Daily Session Note  Patient Details  Name: Philip Cook MRN: [id number] Date of Birth: 1960/12/12 Referring Provider:     Cardiac Rehab from 05/13/2017 in [facility] Cardiac and Pulmonary Rehab  Referring Provider  Paraschos      Encounter Date: 07/08/2017  Check In:     Session Check In - 07/08/17 1630      Check-In   Location [facility]-Cardiac & Pulmonary Rehab   Staff Present Gerlene Burdock, RN, BSN;Joseph Hood RCP,RRT,BSRT;Kelly [place], [place], ACSM CEP, Exercise Physiologist   Supervising physician immediately available to respond to emergencies See telemetry face sheet for immediately available ER MD   Medication changes reported     No   Fall or balance concerns reported    No   Warm-up and Cool-down Performed on first and last piece of equipment   Resistance Training Performed No  left early for work/omitted strength training   VAD Patient? No     Pain Assessment   Currently in Pain? No/denies   Multiple Pain Sites No           Exercise Prescription Changes - 07/08/17 1500      Response to Exercise   Blood Pressure (Admit) 110/68   Blood Pressure (Exercise) 144/72   Blood Pressure (Exit) 122/78   Heart Rate (Admit) 80 bpm   Heart Rate (Exercise) 114 bpm   Heart Rate (Exit) 90 bpm   Rating of Perceived Exertion (Exercise) 15   Symptoms none   Duration Continue with 45 min of aerobic exercise without signs/symptoms of physical distress.   Intensity THRR unchanged     Progression   Progression Continue to progress workloads to maintain intensity without signs/symptoms of physical distress.   Average METs 3.7     Resistance Training   Training Prescription Yes   Weight 3   Reps 10-15     Interval Training   Interval Training No     Treadmill   MPH 3.2   Grade 2.5   Minutes 15   METs 4.77     T5 Nustep   Level 7   SPM 80   Minutes 15   METs 2.6     Home Exercise Plan   Plans to continue exercise at [employer] (comment)  [employer]   Frequency Add 3 additional days to program exercise sessions.   Initial Home Exercises Provided 06/30/17      History  Smoking Status  . Never Smoker  Smokeless Tobacco  . Never Used    Goals Met:  Independence with exercise equipment Exercise tolerated well No report of cardiac concerns or symptoms Strength training completed today  Goals Unmet:  Not Applicable  Comments: Pt able to follow exercise prescription today without complaint.  Will continue to monitor for progression.   Dr. Emily Filbert is Medical Director for [place] and [facility].

## 2017-07-15 ENCOUNTER — Encounter: Payer: Self-pay | Admitting: *Deleted

## 2017-07-21 ENCOUNTER — Telehealth: Payer: Self-pay

## 2017-07-21 NOTE — Telephone Encounter (Signed)
Philip Cook gave blood today and doesn't feel like he can work out.  He plans to attend tomorrow.

## 2017-07-22 ENCOUNTER — Encounter: Payer: BC Managed Care – PPO | Attending: Cardiology

## 2017-07-22 VITALS — BP 128/80 | HR 80 | Wt 316.3 lb

## 2017-07-22 DIAGNOSIS — Z9641 Presence of insulin pump (external) (internal): Secondary | ICD-10-CM | POA: Diagnosis not present

## 2017-07-22 DIAGNOSIS — I110 Hypertensive heart disease with heart failure: Secondary | ICD-10-CM | POA: Insufficient documentation

## 2017-07-22 DIAGNOSIS — E785 Hyperlipidemia, unspecified: Secondary | ICD-10-CM | POA: Diagnosis not present

## 2017-07-22 DIAGNOSIS — Z7982 Long term (current) use of aspirin: Secondary | ICD-10-CM | POA: Insufficient documentation

## 2017-07-22 DIAGNOSIS — Z79899 Other long term (current) drug therapy: Secondary | ICD-10-CM | POA: Diagnosis not present

## 2017-07-22 DIAGNOSIS — I2510 Atherosclerotic heart disease of native coronary artery without angina pectoris: Secondary | ICD-10-CM | POA: Diagnosis not present

## 2017-07-22 DIAGNOSIS — I5032 Chronic diastolic (congestive) heart failure: Secondary | ICD-10-CM | POA: Diagnosis present

## 2017-07-22 NOTE — Progress Notes (Signed)
Daily Session Note  Patient Details  Name: Philip Cook MRN: [id number] Date of Birth: 12/24/1960 Referring Provider:     Cardiac Rehab from 05/13/2017 in [facility] Cardiac and Pulmonary Rehab  Referring Provider  Paraschos      Encounter Date: 07/22/2017  Check In:     Session Check In - 07/22/17 1614      Check-In   Location [facility]-Cardiac & Pulmonary Rehab   Staff Present Gerlene Burdock, RN, Moises Blood, BS, ACSM CEP, Exercise Physiologist;Jerianne Anselmo Flavia Shipper   Supervising physician immediately available to respond to emergencies See telemetry face sheet for immediately available ER MD   Medication changes reported     No   Fall or balance concerns reported    No   Warm-up and Cool-down Performed on first and last piece of equipment   Resistance Training Performed Yes   VAD Patient? No     Pain Assessment   Currently in Pain? No/denies         History  Smoking Status  . Never Smoker  Smokeless Tobacco  . Never Used    Goals Met:  Independence with exercise equipment Exercise tolerated well No report of cardiac concerns or symptoms Strength training completed today  Goals Unmet:  Not Applicable  Comments: Pt able to follow exercise prescription today without complaint.  Will continue to monitor for progression.   Dr. Emily Filbert is Medical Director for [place] and [facility].

## 2017-07-28 ENCOUNTER — Observation Stay
Admission: EM | Admit: 2017-07-28 | Discharge: 2017-07-29 | Disposition: A | Discharge status: Home / Self Care | Payer: BC Managed Care – PPO | Attending: Internal Medicine | Admitting: Internal Medicine

## 2017-07-28 ENCOUNTER — Other Ambulatory Visit: Payer: Self-pay

## 2017-07-28 ENCOUNTER — Encounter: Payer: BC Managed Care – PPO | Admitting: *Deleted

## 2017-07-28 ENCOUNTER — Encounter: Payer: Self-pay | Admitting: Emergency Medicine

## 2017-07-28 ENCOUNTER — Encounter: Payer: Self-pay | Admitting: *Deleted

## 2017-07-28 ENCOUNTER — Emergency Department: Payer: BC Managed Care – PPO

## 2017-07-28 ENCOUNTER — Telehealth: Payer: Self-pay

## 2017-07-28 DIAGNOSIS — I209 Angina pectoris, unspecified: Secondary | ICD-10-CM | POA: Diagnosis present

## 2017-07-28 DIAGNOSIS — I110 Hypertensive heart disease with heart failure: Secondary | ICD-10-CM | POA: Diagnosis not present

## 2017-07-28 DIAGNOSIS — I5032 Chronic diastolic (congestive) heart failure: Secondary | ICD-10-CM | POA: Diagnosis not present

## 2017-07-28 DIAGNOSIS — Z9641 Presence of insulin pump (external) (internal): Secondary | ICD-10-CM | POA: Diagnosis not present

## 2017-07-28 DIAGNOSIS — R079 Chest pain, unspecified: Secondary | ICD-10-CM | POA: Diagnosis present

## 2017-07-28 DIAGNOSIS — Z882 Allergy status to sulfonamides status: Secondary | ICD-10-CM | POA: Diagnosis not present

## 2017-07-28 DIAGNOSIS — I25119 Atherosclerotic heart disease of native coronary artery with unspecified angina pectoris: Principal | ICD-10-CM | POA: Insufficient documentation

## 2017-07-28 DIAGNOSIS — I10 Essential (primary) hypertension: Secondary | ICD-10-CM | POA: Diagnosis present

## 2017-07-28 DIAGNOSIS — G4733 Obstructive sleep apnea (adult) (pediatric): Secondary | ICD-10-CM | POA: Diagnosis not present

## 2017-07-28 DIAGNOSIS — E119 Type 2 diabetes mellitus without complications: Secondary | ICD-10-CM | POA: Diagnosis not present

## 2017-07-28 DIAGNOSIS — I2510 Atherosclerotic heart disease of native coronary artery without angina pectoris: Secondary | ICD-10-CM | POA: Diagnosis present

## 2017-07-28 DIAGNOSIS — Z7982 Long term (current) use of aspirin: Secondary | ICD-10-CM | POA: Insufficient documentation

## 2017-07-28 DIAGNOSIS — Z794 Long term (current) use of insulin: Secondary | ICD-10-CM | POA: Diagnosis not present

## 2017-07-28 DIAGNOSIS — Z79899 Other long term (current) drug therapy: Secondary | ICD-10-CM | POA: Diagnosis not present

## 2017-07-28 DIAGNOSIS — E785 Hyperlipidemia, unspecified: Secondary | ICD-10-CM | POA: Insufficient documentation

## 2017-07-28 LAB — BASIC METABOLIC PANEL
ANION GAP: 9 (ref 5–15)
BUN: 20 mg/dL (ref 6–20)
CHLORIDE: 105 mmol/L (ref 101–111)
CO2: 24 mmol/L (ref 22–32)
Calcium: 9.4 mg/dL (ref 8.9–10.3)
Creatinine, Ser: 0.9 mg/dL (ref 0.61–1.24)
GFR calc non Af Amer: >60 mL/min (ref >60)
Glucose, Bld: 240 mg/dL — ABNORMAL HIGH (ref 65–99)
POTASSIUM: 4.1 mmol/L (ref 3.5–5.1)
SODIUM: 138 mmol/L (ref 135–145)

## 2017-07-28 LAB — CBC
HEMATOCRIT: 38.5 % — AB (ref 40.0–52.0)
HEMOGLOBIN: 12.9 g/dL — AB (ref 13.0–18.0)
MCH: 29.2 pg (ref 26.0–34.0)
MCHC: 33.4 g/dL (ref 32.0–36.0)
MCV: 87.6 fL (ref 80.0–100.0)
PLATELETS: 188 10*3/uL (ref 150–440)
RBC: 4.4 MIL/uL (ref 4.40–5.90)
RDW: 15.3 % — ABNORMAL HIGH (ref 11.5–14.5)
WBC: 8.8 10*3/uL (ref 3.8–10.6)

## 2017-07-28 LAB — GLUCOSE, CAPILLARY: Glucose-Capillary: 128 mg/dL — ABNORMAL HIGH (ref 65–99)

## 2017-07-28 LAB — TROPONIN I: Troponin I: <0.03 ng/mL (ref <0.03)

## 2017-07-28 MED ORDER — ISOSORBIDE MONONITRATE ER 30 MG PO TB24: 30.0000 mg | ORAL_TABLET | Freq: Every day | ORAL | Status: DC

## 2017-07-28 MED ORDER — ASPIRIN 325 MG PO TABS
325.0000 mg | ORAL_TABLET | Freq: Every day | ORAL | Status: DC
  Administered 2017-07-29: 325 mg via ORAL
  Filled 2017-07-28: qty 1

## 2017-07-28 MED ORDER — ACETAMINOPHEN 325 MG PO TABS: 650.0000 mg | ORAL_TABLET | Freq: Four times a day (QID) | ORAL | Status: DC | PRN

## 2017-07-28 MED ORDER — NEBIVOLOL HCL 10 MG PO TABS
10.0000 mg | ORAL_TABLET | Freq: Every day | ORAL | Status: DC
  Administered 2017-07-29: 10 mg via ORAL
  Filled 2017-07-28: qty 1

## 2017-07-28 MED ORDER — ACETAMINOPHEN 650 MG RE SUPP: 650.0000 mg | Freq: Four times a day (QID) | RECTAL | Status: DC | PRN

## 2017-07-28 MED ORDER — INSULIN PUMP
Freq: Three times a day (TID) | SUBCUTANEOUS | Status: DC
  Administered 2017-07-28: 3.5 via SUBCUTANEOUS
  Filled 2017-07-28: qty 1

## 2017-07-28 MED ORDER — ENOXAPARIN SODIUM 40 MG/0.4ML ~~LOC~~ SOLN
40.0000 mg | SUBCUTANEOUS | Status: DC
  Administered 2017-07-28: 40 mg via SUBCUTANEOUS
  Filled 2017-07-28: qty 0.4

## 2017-07-28 MED ORDER — ROSUVASTATIN CALCIUM 10 MG PO TABS: 10.0000 mg | ORAL_TABLET | Freq: Every day | ORAL | Status: DC

## 2017-07-28 MED ORDER — ASPIRIN 81 MG PO CHEW
324.0000 mg | CHEWABLE_TABLET | Freq: Once | ORAL | Status: AC
  Administered 2017-07-28: 324 mg via ORAL
  Filled 2017-07-28: qty 4

## 2017-07-28 MED ORDER — PRASUGREL HCL 10 MG PO TABS
10.0000 mg | ORAL_TABLET | Freq: Every day | ORAL | Status: DC
  Administered 2017-07-29: 10 mg via ORAL
  Filled 2017-07-28: qty 1

## 2017-07-28 MED ORDER — FUROSEMIDE 20 MG PO TABS
20.0000 mg | ORAL_TABLET | Freq: Two times a day (BID) | ORAL | Status: DC
  Administered 2017-07-28 – 2017-07-29 (×2): 20 mg via ORAL
  Filled 2017-07-28 (×2): qty 1

## 2017-07-28 MED ORDER — INSULIN ASPART 100 UNIT/ML ~~LOC~~ SOLN: 0.0000 [IU] | Freq: Four times a day (QID) | SUBCUTANEOUS | Status: DC

## 2017-07-28 MED ORDER — ONDANSETRON HCL 4 MG/2ML IJ SOLN: 4.0000 mg | Freq: Four times a day (QID) | INTRAMUSCULAR | Status: DC | PRN

## 2017-07-28 MED ORDER — ONDANSETRON HCL 4 MG PO TABS: 4.0000 mg | ORAL_TABLET | Freq: Four times a day (QID) | ORAL | Status: DC | PRN

## 2017-07-28 NOTE — Progress Notes (Signed)
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  Educator  [facility]  Instruction Review Code  1- Verbalizes Understanding      Cardiac Procedures: - Group verbal and written instruction to review commonly prescribed medications for heart disease. Reviews the medication, class of the drug, and side effects. Includes the steps to properly store meds and maintain the prescription regimen. (beta blockers and nitrates)   Cardiac Rehab from 07/07/2017 in [facility] Cardiac and Pulmonary Rehab  Date  06/14/17  Educator  [facility]  Instruction Review Code  1- Verbalizes Understanding      Cardiac Medications I: - Group verbal and written instruction to review commonly prescribed medications for heart disease. Reviews the medication, class of the drug, and side effects. Includes the steps to properly store meds and maintain the prescription regimen.   Cardiac Rehab from 07/07/2017 in [facility] Cardiac  and Pulmonary Rehab  Date  06/21/17  Educator  [place]  Instruction Review Code  1- Verbalizes Understanding      Cardiac Medications II: -Group verbal and written instruction to review commonly prescribed medications for heart disease. Reviews the medication, class of the drug, and side effects. (all other drug classes)    Go Sex-Intimacy & Heart Disease, Get SMART - Goal Setting: - Group verbal and written instruction through game format to discuss heart disease and the return to sexual intimacy. Provides group verbal and written material to discuss and apply goal setting through the application of the S.M.A.R.T. Method.   Cardiac Rehab from 07/07/2017 in [facility] Cardiac and Pulmonary Rehab  Date  06/14/17  Educator  [facility]  Instruction Review Code  1- Verbalizes Understanding      Other Matters  of the Heart: - Provides group verbal, written materials and models to describe Heart Failure, Angina, Valve Disease, Peripheral Artery Disease, and Diabetes in the realm of heart disease. Includes description of the disease process and treatment options available to the cardiac patient.   Cardiac Rehab from 07/07/2017 in [facility] Cardiac and Pulmonary Rehab  Date  06/07/17  Educator  [facility]  Instruction Review Code  1- Verbalizes Understanding      Exercise & Equipment Safety: - Individual verbal instruction and demonstration of equipment use and safety with use of the equipment.   Cardiac Rehab from 07/07/2017 in [facility] Cardiac and Pulmonary Rehab  Date  05/13/17  Educator  [facility]      Infection Prevention: - Provides verbal and written material to individual with discussion of infection control including proper hand washing and proper equipment cleaning during exercise session.   Cardiac Rehab from 07/07/2017 in [facility] Cardiac and Pulmonary Rehab  Date  05/13/17  Educator  [facility]      Falls Prevention: - Provides verbal and written material to individual with discussion of falls prevention and safety.    Cardiac Rehab from 07/07/2017 in [facility] Cardiac and Pulmonary Rehab  Date  05/13/17  Educator  [facility]  Instruction Review Code (retired)  2- meets goals/outcomes      Diabetes: - Individual verbal and written instruction to review signs/symptoms of diabetes, desired ranges of glucose level fasting, after meals and with exercise. Acknowledge that pre and post exercise glucose checks will be done for 3 sessions at entry of program.   Cardiac Rehab from 07/07/2017 in [facility] Cardiac and Pulmonary Rehab  Date  05/13/17  Educator  [facility]      Other: -Provides group and verbal instruction on various topics (see comments)    Knowledge Questionnaire Score: Knowledge Questionnaire Score - 05/13/17 1347      Knowledge Questionnaire Score   Pre Score  21/28 Correct answers reviewed with Philip Cook   Correct answers reviewed with Philip Cook      Core Components/Risk Factors/Patient Goals at Admission: Personal Goals and Risk Factors at Admission - 05/13/17 1317      Core Components/Risk Factors/Patient Goals on Admission    Weight Management  Yes    Intervention  Obesity: Provide education and appropriate resources to help participant work on and attain dietary goals.;Weight Management: Develop a combined nutrition and exercise program designed to reach desired caloric intake, while maintaining appropriate intake of nutrient and fiber, sodium and fats, and appropriate energy expenditure required for the weight goal.;Weight Management: Provide education and appropriate resources to help participant work on and attain dietary goals.;Weight Management/Obesity: Establish reasonable short term and long term weight goals.    Admit Weight  327 lb 8 oz (148.6 kg)    Goal Weight: Short Term  323 lb (146.5 kg)    Goal Weight: Long Term  240 lb (108.9 kg)    Expected Outcomes  Short Term: Continue to assess and modify interventions until short term weight is achieved;Long Term: Adherence to nutrition and physical  activity/exercise program aimed toward attainment of established weight goal;Weight Loss: Understanding of general recommendations for a balanced deficit meal plan, which promotes 1-2 lb weight loss per week and includes a negative energy balance of [phone] kcal/d;Understanding recommendations for meals to include 15-35% energy as protein, 25-35% energy from fat, 35-60% energy from carbohydrates, less than 245m of dietary cholesterol, 20-35 gm of total fiber daily;Understanding of distribution of calorie intake throughout the day with the consumption of 4-5 meals/snacks  Improve shortness of breath with ADL's  Yes    Intervention  Provide education, individualized exercise plan and daily activity instruction to help decrease symptoms of SOB with activities of daily living.    Expected Outcomes  Short Term: Achieves a reduction of symptoms when performing activities of daily living.    Diabetes  Yes    Intervention  Provide education about signs/symptoms and action to take for hypo/hyperglycemia.;Provide education about proper nutrition, including hydration, and aerobic/resistive exercise prescription along with prescribed medications to achieve blood glucose in normal ranges: Fasting glucose 65-99 mg/dL    Expected Outcomes  Short Term: Participant verbalizes understanding of the signs/symptoms and immediate care of hyper/hypoglycemia, proper foot care and importance of medication, aerobic/resistive exercise and nutrition plan for blood glucose control.;Long Term: Attainment of HbA1C < 7%.    Heart Failure  Yes    Intervention  Provide a combined exercise and nutrition program that is supplemented with education, support and counseling about heart failure. Directed toward relieving symptoms such as shortness of breath, decreased exercise tolerance, and extremity edema.    Expected Outcomes  Short term: Attendance in program 2-3 days a week with increased exercise capacity. Reported lower sodium intake.  Reported increased fruit and vegetable intake. Reports medication compliance.;Short term: Daily weights obtained and reported for increase. Utilizing diuretic protocols set by physician.;Long term: Adoption of self-care skills and reduction of barriers for early signs and symptoms recognition and intervention leading to self-care maintenance.;Improve functional capacity of life    Lipids  Yes    Intervention  Provide education and support for participant on nutrition & aerobic/resistive exercise along with prescribed medications to achieve LDL <4m, HDL >43m    Expected Outcomes  Short Term: Participant states understanding of desired cholesterol values and is compliant with medications prescribed. Participant is following exercise prescription and nutrition guidelines.;Long Term: Cholesterol controlled with medications as prescribed, with individualized exercise RX and with personalized nutrition plan. Value goals: LDL < 7082mHDL > 40 mg.    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 With his health issues and responsibility for daughters and school, it can be difficult at times   Intervention  Offer individual and/or small group education and counseling on adjustment to heart disease, stress management and health-related lifestyle change. Teach and support self-help strategies.;Refer participants experiencing significant psychosocial distress to appropriate mental health specialists for further evaluation and treatment. When possible, include family members and significant others in education/counseling sessions.    Expected Outcomes  Short Term: Participant demonstrates changes in health-related  behavior, relaxation and other stress management skills, ability to obtain effective social support, and compliance with psychotropic medications if prescribed.;Long Term: Emotional wellbeing is indicated by absence of clinically significant psychosocial distress or social isolation.       Core Components/Risk Factors/Patient Goals Review:  Goals and Risk Factor Review    Row Name 06/30/17 0810034/25/18 1537           Core Components/Risk Factors/Patient Goals Review   Personal Goals Review  Weight Management/Obesity  Weight Management/Obesity      Review  "313lbs today". "I met with the Cook before they tell  me to eat everything I hate. I am losing weight. I go to [employer] 3 days a week when I am not exercising with you folks. I will be in Cardiac Rehab this evening. My daughter has softball practice and games that keeps me from Cardiac Rehab sometimes"  Philip Cook "Philip Cook: has lost weight. Stress due to 3 softball games of his daughters plus he works full time. Philip Cook said he is unable to attend this evening since he just needs a break to rest.      Expected Outcomes  Weight below 300lbs. Eating healthier and heart healthy lifetyle.   Heart healthy living. Weight loss.         Core Components/Risk Factors/Patient Goals at Discharge (Final Review):  Goals and Risk Factor Review - 07/15/17 1537      Core Components/Risk Factors/Patient Goals Review   Personal Goals Review  Weight Management/Obesity    Review  Philip Cook "Philip Cook: has lost weight. Stress due to 3 softball games of his daughters plus he works full time. Philip Cook said he is unable to attend this evening since he just needs a break to rest.    Expected Outcomes  Heart healthy living. Weight loss.       ITP Comments: ITP Comments    Row Name 05/13/17 1313 05/19/17 1753 05/19/17 1758 06/02/17 1135 06/25/17 1323   ITP Comments  Med Review completed. Initial ITP created. Diagnosis can be found Care Everywhere 04/19/17  : First full day  of exercise!  Patient was oriented to gym and equipment including functions, settings, policies, and procedures.  Patient's individual exercise prescription and treatment plan were reviewed.  All starting workloads were established based on the results of the 6 minute walk test done at initial orientation visit.  The plan for exercise progression was also introduced and progression will be customized based on patient's performance and goals.  Reviewed RPE scale, THR and program prescription with pt today.  Pt voiced understanding and was given a copy of goals to take home.   30 day review. Continue with ITP unless directed changes per Medical Director review.    I left a vm to follow up with Philip Cook" [place] about his stomach issues yesterday and also to see how Cardiac Rehab is going for him in general (ie. Short term goal to lose weight to 233lbs etc.). Follow up if he has any questions about his diabetes.   [place] Name 06/30/17 0803 07/28/17 0603         ITP Comments  30 Day Review. Continue with ITP unless directed changes per Medical Director Review.   30 day review. Continue with ITP unless directed changes per Medical Director review.          Comments:

## 2017-07-28 NOTE — Telephone Encounter (Signed)
Arbie Cookey with Cardiac Rehab called stating that patient was having jaw pain that started yesterday while at [employer]. Pain is described as a 2. Arbie Cookey was wanting to send patient home with directions to follow up with Dr. Saralyn Pilar office in the morning.   I agreed with her decision to send him home and follow up with Dr. Saralyn Pilar in the morning as Darylene Price will not be in clinic tomorrow due to training. Patient is to be advised if chest pain gets worse he is to be seen at the ED.   Arbie Cookey to relay information to the patient.

## 2017-07-28 NOTE — Progress Notes (Signed)
Incomplete Session Note  Patient Details  Name: Philip Cook MRN: [id number] Date of Birth: Feb 10, 1961 Referring Provider:     Cardiac Rehab from 05/13/2017 in [facility] Cardiac and Pulmonary Rehab  Referring Provider  Philip Cook did not complete his rehab session. Philip Cook was complaining of jaw pain 5/10 while on the treadmill for about five minutes. The pain relieved with rest. He reported that this same issue happened Friday while exercising at [employer], and again relieved with rest. He also stated that his brother suddenly died after having the same symptoms and being told to go home and rest. Staff felt it was at his best interest to be evaluated and escorted Philip Cook to the emergency room.

## 2017-07-28 NOTE — ED Provider Notes (Signed)
[facility] Emergency Department Provider Note   ____________________________________________   First MD Initiated Contact with Patient 07/28/17 1957     (approximate)  I have reviewed the triage vital signs and the nursing notes.   HISTORY  Chief Complaint Chest Pain    HPI Philip Cook is a 56 y.o. male history of known coronary disease previous stenting  Patient reports that on Friday while walking on treadmill, he began to experience chest pain at [facility] and noticed if he slowed the treadmill down the pain went away, whenever you try to speeded up his chest pain would come back and felt like is the middle of chest radiate to his jaw.  Pain went away after he stopped.  At present time, he reports that he went to cardiac rehab today and was on the treadmill for about 5 minutes when he began experiencing the same pain in his chest that radiated to his left jaw and went away after they stop after couple minutes.  He has not taken his aspirin a day.  He has known cardiac disease.  The cardiology stress test in clinic recommended he come appear to be evaluated.  Currently his pain and symptom-free but did experience some shortness of breath with this.  He is currently symptom-free  Pain was moderate in intensity, pressure-like and radiated towards the left jaw.  Past Medical History:  Diagnosis Date  . CHF (congestive heart failure) ([place])   . Coronary artery disease   . Diabetes mellitus without complication ([place])   . Hyperlipidemia   . Hypertension     Patient Active Problem List   Diagnosis Date Noted  . CAD (coronary artery disease) 07/28/2017  . Chronic diastolic heart failure ([place]) 04/19/2017  . Diabetes ([place]) 04/19/2017  . HTN (hypertension) 04/19/2017  . Obstructive sleep apnea 04/19/2017  . S/P cardiac catheterization 04/19/2017  . Chest pain 04/05/2017    Past Surgical History:  Procedure Laterality Date  . CORONARY  ANGIOPLASTY WITH STENT PLACEMENT      Prior to Admission medications   Medication Sig Start Date End Date Taking? Authorizing Provider  aspirin 325 MG tablet Take 325 mg by mouth daily.   Yes [provider]  furosemide (LASIX) 20 MG tablet Take 1 tablet (20 mg total) by mouth 2 (two) times daily. 04/07/17 04/07/18 Yes Dustin Flock, MD  Insulin Human (INSULIN PUMP) SOLN Inject into the skin.   Yes [provider]  isosorbide mononitrate (IMDUR) 30 MG 24 hr tablet Take 1 tablet (30 mg total) by mouth daily. 04/07/17  Yes Dustin Flock, MD  meloxicam (MOBIC) 7.5 MG tablet Take 7.5 mg by mouth daily.   Yes [provider]  nebivolol (BYSTOLIC) 10 MG tablet Take 10 mg by mouth daily.   Yes [provider]  prasugrel (EFFIENT) 10 MG TABS tablet TAKE ONE TABLET BY MOUTH EVERY DAY 01/04/17  Yes [provider]  rosuvastatin (CRESTOR) 10 MG tablet Take 1 tablet (10 mg total) by mouth daily. 04/08/17  Yes Dustin Flock, MD    Allergies Sulfa antibiotics  Family History  Problem Relation Age of Onset  . CAD Father   . Heart failure Brother     Social History Social History   Tobacco Use  . Smoking status: Never Smoker  . Smokeless tobacco: Never Used  Substance Use Topics  . Alcohol use: No  . Drug use: No    Review of Systems Constitutional: No fever/chills Eyes: No visual changes.  ENT: No sore throat. Cardiovascular: See HPI Respiratory: See HPI. Gastrointestinal: No abdominal pain.  No nausea, no vomiting.  No diarrhea.  No constipation. Genitourinary: Negative for dysuria. Musculoskeletal: Negative for back pain. Skin: Negative for rash. Neurological: Negative for headaches, focal weakness or numbness.    ____________________________________________   PHYSICAL EXAM:  VITAL SIGNS: ED Triage Vitals  Enc Vitals Group     BP 07/28/17 1707 (!) 162/71     Pulse Rate 07/28/17 1707 74     Resp 07/28/17 1707 18     Temp  07/28/17 1707 99.1 F (37.3 C)     Temp Source 07/28/17 1707 Oral     SpO2 07/28/17 1707 98 %     Weight 07/28/17 1708 (!) 312 lb 4.8 oz (141.7 kg)     Height 07/28/17 1708 5\' 10"  (1.778 m)     Head Circumference --      Peak Flow --      Pain Score 07/28/17 1707 0     Pain Loc --      Pain Edu? --      Excl. in [place]? --     Constitutional: Alert and oriented. Well appearing and in no acute distress. Eyes: Conjunctivae are normal. Head: Atraumatic. Nose: No congestion/rhinnorhea. Mouth/Throat: Mucous membranes are moist. Neck: No stridor.   Cardiovascular: Normal rate, regular rhythm. Grossly normal heart sounds.  Good peripheral circulation. Respiratory: Normal respiratory effort.  No retractions. Lungs CTAB. Gastrointestinal: Soft and nontender. No distention. Musculoskeletal: No lower extremity tenderness nor edema. Neurologic:  Normal speech and language. No gross focal neurologic deficits are appreciated.  Skin:  Skin is warm, dry and intact. No rash noted. Psychiatric: Mood and affect are normal. Speech and behavior are normal.  ____________________________________________   LABS (all labs ordered are listed, but only abnormal results are displayed)  Labs Reviewed  BASIC METABOLIC PANEL - Abnormal; Notable for the following components:      Result Value   Glucose, Bld 240 (*)    All other components within normal limits  CBC - Abnormal; Notable for the following components:   Hemoglobin 12.9 (*)    HCT 38.5 (*)    RDW 15.3 (*)    All other components within normal limits  TROPONIN I   ____________________________________________  EKG  ED ECG REPORT I, Demari Kropp, the attending physician, personally viewed and interpreted this ECG.  Date: 07/28/2017 EKG Time: 1705 Rate: 80 Rhythm: normal sinus rhythm QRS Axis: normal Intervals: normal ST/T Wave abnormalities: normal Narrative Interpretation: no evidence of acute  ischemia  ____________________________________________  RADIOLOGY  cxr no acute findings, reviewed by me ____________________________________________   PROCEDURES  Procedure(s) performed: None  Procedures  Critical Care performed: No  ____________________________________________   INITIAL IMPRESSION / ASSESSMENT AND PLAN / ED COURSE  Pertinent labs & imaging results that were available during my care of the patient were reviewed by me and considered in my medical decision making (see chart for details).  Differential diagnosis includes, but is not limited to, ACS, aortic dissection, pulmonary embolism, cardiac tamponade, pneumothorax, pneumonia, pericarditis, myocarditis, GI-related causes including esophagitis/gastritis, and musculoskeletal chest wall pain.  I am very concerned that the patient is experiencing new onset of angina, unstable angina.  He has clearly giving a history of reproducible chest pain with cardiac characteristics while on the treadmill the last 2 times.  He is presently asymptomatic and his first troponin is normal.  No signs or symptoms of pleuritic pain, does not have any hypoxia  or tachycardia and I doubt this would represent pulmonary M was not.  No ripping tearing or moving pain to suggest dissection.  The patient's heart score is 5 points, he is at moderate risk of Mace of about 12-16%.  Page Dr. Clayborn Bigness to discuss, I am recommending the patient be admitted for cardiac evaluation for a moderately elevated risk of acute coronary syndrome as etiology of his chest pain.  ----------------------------------------- 9:29 PM on 07/28/2017 -----------------------------------------     I discussed with patient, currently pain-free and agreeable with plan for admission.  ____________________________________________   FINAL CLINICAL IMPRESSION(S) / ED DIAGNOSES  Final diagnoses:  Moderate risk chest pain  New-onset angina (HCC)      NEW  MEDICATIONS STARTED DURING THIS VISIT:  This SmartLink is deprecated. Use AVSMEDLIST instead to display the medication list for a patient.   Note:  This document was prepared using Dragon voice recognition software and may include unintentional dictation errors.     Delman Kitten, MD 07/28/17 2129

## 2017-07-28 NOTE — ED Triage Notes (Addendum)
Patient presents to the ED from Cardiac Rehab.  Patient was on the tread mill when he began to feel left upper chest pressure radiating into his neck.  Pain subsided once patient was able to rest for several minutes.  Patient states he had a similar episode at [employer] while working out recently.  Patient has history of a cardiac stent and CHF.  Patient states his brother died of a heart attack at his same age.

## 2017-07-28 NOTE — H&P (Signed)
[facility] [place] NAME: Philip Cook    MR#:  [id number]  DATE OF BIRTH:  24-Oct-1960  DATE OF ADMISSION:  07/28/2017  PRIMARY CARE PHYSICIAN: [name], MD   REQUESTING/REFERRING PHYSICIAN: Jacqualine Code, MD  CHIEF COMPLAINT:   Chief Complaint  Patient presents with  . Chest Pain    HISTORY OF PRESENT ILLNESS:  Philip Cook  is a 56 y.o. male who presents with 2 episodes of chest pain.  Patient states that several days ago he was at the gym walking on the treadmill and he then developed central upper chest pain that radiated to his jaw.  He slowed down the treadmill and then got off and rested and pain subsided.  Today he was at heart failure clinic walking on a treadmill and had a similar experience.  He came to the ED for evaluation.  Here initial workup was largely within normal limits.  Patient does have a known history of CAD with prior stenting.  Hospitalist were called for admission and further evaluation.  PAST MEDICAL HISTORY:   Past Medical History:  Diagnosis Date  . CHF (congestive heart failure) ([place])   . Coronary artery disease   . Diabetes mellitus without complication ([place])   . Hyperlipidemia   . Hypertension     PAST SURGICAL HISTORY:   Past Surgical History:  Procedure Laterality Date  . CORONARY ANGIOPLASTY WITH STENT PLACEMENT      SOCIAL HISTORY:   Social History   Tobacco Use  . Smoking status: Never Smoker  . Smokeless tobacco: Never Used  Substance Use Topics  . Alcohol use: No    FAMILY HISTORY:   Family History  Problem Relation Age of Onset  . CAD Father   . Heart failure Brother     DRUG ALLERGIES:   Allergies  Allergen Reactions  . Sulfa Antibiotics Rash    MEDICATIONS AT HOME:   Prior to Admission medications   Medication Sig Start Date End Date Taking? Authorizing Provider  aspirin 325 MG tablet Take 325 mg by mouth daily.   Yes [provider]   furosemide (LASIX) 20 MG tablet Take 1 tablet (20 mg total) by mouth 2 (two) times daily. 04/07/17 04/07/18 Yes Dustin Flock, MD  Insulin Human (INSULIN PUMP) SOLN Inject into the skin.   Yes [provider]  isosorbide mononitrate (IMDUR) 30 MG 24 hr tablet Take 1 tablet (30 mg total) by mouth daily. 04/07/17  Yes Dustin Flock, MD  meloxicam (MOBIC) 7.5 MG tablet Take 7.5 mg by mouth daily.   Yes [provider]  nebivolol (BYSTOLIC) 10 MG tablet Take 10 mg by mouth daily.   Yes [provider]  prasugrel (EFFIENT) 10 MG TABS tablet TAKE ONE TABLET BY MOUTH EVERY DAY 01/04/17  Yes [provider]  rosuvastatin (CRESTOR) 10 MG tablet Take 1 tablet (10 mg total) by mouth daily. 04/08/17  Yes Dustin Flock, MD    REVIEW OF SYSTEMS:  Review of Systems  Constitutional: Negative for chills, fever, malaise/fatigue and weight loss.  HENT: Negative for ear pain, hearing loss and tinnitus.   Eyes: Negative for blurred vision, double vision, pain and redness.  Respiratory: Negative for cough, hemoptysis and shortness of breath.   Cardiovascular: Positive for chest pain. Negative for palpitations, orthopnea and leg swelling.  Gastrointestinal: Negative for abdominal pain, constipation, diarrhea, nausea and vomiting.  Genitourinary: Negative for dysuria, frequency and hematuria.  Musculoskeletal: Negative for back pain,  joint pain and neck pain.  Skin:       No acne, rash, or lesions  Neurological: Negative for dizziness, tremors, focal weakness and weakness.  Endo/Heme/Allergies: Negative for polydipsia. Does not bruise/bleed easily.  Psychiatric/Behavioral: Negative for depression. The patient is not nervous/anxious and does not have insomnia.      VITAL SIGNS:   Vitals:   07/28/17 1707 07/28/17 1708 07/28/17 2000 07/28/17 2100  BP: (!) 162/71  (!) 146/74 (!) 149/72  Pulse: 74  76 69  Resp: 18  18 (!) 9  Temp: 99.1 F (37.3 C)     TempSrc: Oral      SpO2: 98%  98% 97%  Weight:  (!) 141.7 kg (312 lb 4.8 oz)    Height:  5\' 10"  (1.778 m)     Wt Readings from Last 3 Encounters:  07/28/17 (!) 141.7 kg (312 lb 4.8 oz)  07/22/17 (!) 143.5 kg (316 lb 4.8 oz)  07/08/17 (!) 143.5 kg (316 lb 4.8 oz)    PHYSICAL EXAMINATION:  Physical Exam  Vitals reviewed. Constitutional: He is oriented to person, place, and time. He appears well-developed and well-nourished. No distress.  HENT:  Head: Normocephalic and atraumatic.  Mouth/Throat: Oropharynx is clear and moist.  Eyes: Conjunctivae and EOM are normal. Pupils are equal, round, and reactive to light. No scleral icterus.  Neck: Normal range of motion. Neck supple. No JVD present. No thyromegaly present.  Cardiovascular: Normal rate, regular rhythm and intact distal pulses. Exam reveals no gallop and no friction rub.  No murmur heard. Respiratory: Effort normal and breath sounds normal. No respiratory distress. He has no wheezes. He has no rales.  GI: Soft. Bowel sounds are normal. He exhibits no distension. There is no tenderness.  Musculoskeletal: Normal range of motion. He exhibits no edema.  No arthritis, no gout  Lymphadenopathy:    He has no cervical adenopathy.  Neurological: He is alert and oriented to person, place, and time. No cranial nerve deficit.  No dysarthria, no aphasia  Skin: Skin is warm and dry. No rash noted. No erythema.  Psychiatric: He has a normal mood and affect. His behavior is normal. Judgment and thought content normal.    LABORATORY PANEL:   CBC Recent Labs  Lab 07/28/17 1722  WBC 8.8  HGB 12.9*  HCT 38.5*  PLT 188   ------------------------------------------------------------------------------------------------------------------  Chemistries  Recent Labs  Lab 07/28/17 1722  NA 138  K 4.1  CL 105  CO2 24  GLUCOSE 240*  BUN 20  CREATININE 0.90  CALCIUM 9.4    ------------------------------------------------------------------------------------------------------------------  Cardiac Enzymes Recent Labs  Lab 07/28/17 1722  TROPONINI <0.03   ------------------------------------------------------------------------------------------------------------------  RADIOLOGY:  Dg Chest 2 View  Result Date: 07/28/2017 CLINICAL DATA:  Chest pain EXAM: CHEST  2 VIEW COMPARISON:  04/06/2017, 11/24/2012 FINDINGS: Mild cardiomegaly. Coarse interstitial opacity likely chronic. No pleural effusion. No focal consolidation. No pneumothorax. Degenerative changes of the spine. IMPRESSION: Borderline to mild cardiomegaly. Negative for edema or focal pulmonary infiltrate. Electronically Signed   By: Donavan Foil M.D.   On: 07/28/2017 18:04    EKG:   Orders placed or performed during the hospital encounter of 07/28/17  . ED EKG within 10 minutes  . ED EKG within 10 minutes    IMPRESSION AND PLAN:  Principal Problem:   Chest pain -initial workup including troponin is largely within normal limits.  Will cycle his cardiac enzymes tonight, get an echocardiogram and a cardiology consult for the morning. Active Problems:  CAD (coronary artery disease) -continue home meds, other workup as above   Chronic diastolic heart failure ([place]) -continue home medications   Diabetes ([place]) -sliding scale insulin with corresponding glucose checks   HTN (hypertension) -stable, continue home meds   Obstructive sleep apnea -CPAP nightly  All the records are reviewed and case discussed with ED provider. Management plans discussed with the patient and/or family.  DVT PROPHYLAXIS: SubQ lovenox  GI PROPHYLAXIS: None  ADMISSION STATUS: Observation  CODE STATUS: Full Code Status History    Date Active Date Inactive Code Status Order ID Comments User Context   04/05/2017 20:52 04/07/2017 21:59 Full Code [id number]  Vaughan Basta, MD ED      TOTAL TIME TAKING CARE OF  THIS PATIENT: 40 minutes.   [facility] [place] 07/28/2017, 9:31 PM  [facility]  Office  [phone]  CC: Primary care physician; [name], MD  Note:  This document was prepared using Dragon voice recognition software and may include unintentional dictation errors.

## 2017-07-29 ENCOUNTER — Other Ambulatory Visit: Payer: Self-pay

## 2017-07-29 ENCOUNTER — Encounter: Payer: Self-pay | Admitting: *Deleted

## 2017-07-29 ENCOUNTER — Observation Stay: Admit: 2017-07-29 | Payer: BC Managed Care – PPO

## 2017-07-29 LAB — GLUCOSE, CAPILLARY
GLUCOSE-CAPILLARY: 149 mg/dL — AB (ref 65–99)
GLUCOSE-CAPILLARY: 92 mg/dL (ref 65–99)
GLUCOSE-CAPILLARY: 83 mg/dL (ref 65–99)
GLUCOSE-CAPILLARY: 110 mg/dL — AB (ref 65–99)

## 2017-07-29 LAB — BASIC METABOLIC PANEL
ANION GAP: 8 (ref 5–15)
BUN: 16 mg/dL (ref 6–20)
CALCIUM: 9 mg/dL (ref 8.9–10.3)
CO2: 27 mmol/L (ref 22–32)
Chloride: 105 mmol/L (ref 101–111)
Creatinine, Ser: 0.71 mg/dL (ref 0.61–1.24)
GLUCOSE: 131 mg/dL — AB (ref 65–99)
Potassium: 3.6 mmol/L (ref 3.5–5.1)
SODIUM: 140 mmol/L (ref 135–145)

## 2017-07-29 LAB — CBC
HEMATOCRIT: 39.7 % — AB (ref 40.0–52.0)
HEMOGLOBIN: 13.1 g/dL (ref 13.0–18.0)
MCH: 29 pg (ref 26.0–34.0)
MCHC: 33.1 g/dL (ref 32.0–36.0)
MCV: 87.7 fL (ref 80.0–100.0)
Platelets: 187 10*3/uL (ref 150–440)
RBC: 4.53 MIL/uL (ref 4.40–5.90)
RDW: 14.7 % — ABNORMAL HIGH (ref 11.5–14.5)
WBC: 8.8 10*3/uL (ref 3.8–10.6)

## 2017-07-29 LAB — TROPONIN I

## 2017-07-29 MED ORDER — ISOSORBIDE MONONITRATE ER 60 MG PO TB24
60.0000 mg | ORAL_TABLET | Freq: Every day | ORAL | Status: DC
  Administered 2017-07-29: 60 mg via ORAL
  Filled 2017-07-29: qty 1

## 2017-07-29 MED ORDER — ISOSORBIDE MONONITRATE ER 30 MG PO TB24: 60.0000 mg | ORAL_TABLET | Freq: Every day | ORAL | 0 refills | Status: DC

## 2017-07-29 NOTE — Progress Notes (Signed)
Patient has an uneventful transfer from the ER. On admission to the unit, patient was A&O X4, denied being in pain or any discomfort, and he was ambulatory independently. Dr. Jannifer Franklin was informed about patient desire to use his  insulin pump. Insulin pump therapy contract.  Patient remained hemodynamically stable overnight, VSS and NSR without any acute event.

## 2017-07-29 NOTE — Progress Notes (Signed)
Inpatient Diabetes Program Recommendations  AACE/ADA: New Consensus Statement on Inpatient Glycemic Control (2015)  Target Ranges:  Prepandial:   less than 140 mg/dL      Peak postprandial:   less than 180 mg/dL (1-2 hours)      Critically ill patients:  140 - 180 mg/dL   Lab Results  Component Value Date   GLUCAP 83 07/29/2017    Review of Glycemic Control  Results for HUMPHREY, GUERREIRO (MRN [id number]) as of 07/29/2017 10:57  Ref. Range 07/28/2017 23:37 07/29/2017 01:57 07/29/2017 06:31 07/29/2017 07:56  Glucose-Capillary Latest Ref Range: 65 - 99 mg/dL 128 (H) 149 (H) 92 83    Diabetes history: Type 2 since he was a teenager  Outpatient Diabetes medications:  He has a Medtronic MiniMed 530G insulin pump and an Enlite continuous glucose monitor (CGM). He uses Humulin R U-500 insulin.   Basal settings 12 am 0.65 units/hr 5 am 1.55 units/hr  11 am 0.65 units/hr 4 pm 1.0 units/hr 24-hour basal = 23.8 "syringe units" of U-500 insulin.  Bolus settings IC ratio 12am 1:9.2, 4 pm 1:8 Sensitivity 42 Target 100-110 Active insulin time 5 hours   Last A1C with Dr. Gabriel Carina on 05/17/17- 8%.   Current orders for Inpatient glycemic control: Insulin pump order set- confirmed with patient that he has not changed insulin pump rates since MD visit in August when he saw MD.   Gentry Fitz, RN, BA, MHA, CDE Diabetes Coordinator Inpatient Diabetes Program  [phone] (Team Pager) [phone] ([place]) 07/29/2017 11:07 AM

## 2017-07-29 NOTE — Discharge Summary (Signed)
[facility] [place] NAME: Philip Cook    MR#:  [id number]  DATE OF BIRTH:  06/28/1961  DATE OF ADMISSION:  07/28/2017 ADMITTING PHYSICIAN: Lance Coon, MD  DATE OF DISCHARGE: 07/29/17  PRIMARY CARE PHYSICIAN: [name], MD    ADMISSION DIAGNOSIS:  New-onset angina ([place]) [I20.9] Moderate risk chest pain [R07.9]  DISCHARGE DIAGNOSIS:  Chest pain--ruled out for MI--cont medical mnx Known h/o CAD HTN DM- 2 on Insulin pump SECONDARY DIAGNOSIS:   Past Medical History:  Diagnosis Date  . CHF (congestive heart failure) ([place])   . Coronary artery disease   . Diabetes mellitus without complication ([place])   . Hyperlipidemia   . Hypertension     HOSPITAL COURSE:  Philip Cook  is a 56 y.o. male who presents with 2 episodes of chest pain.  Patient states that several days ago he was at the gym walking on the treadmill and he then developed central upper chest pain that radiated to his jaw.  He slowed down the treadmill and then got off and rested and pain subsided.  Today he was at heart failure clinic walking on a treadmill and had a similar experience  1.Chest pain  -workup including troponin  x3 is largely within normal limits.   -seen by Dr Josefa Half -recommends increasing Imdur to 60 mg qd  2.  CAD (coronary artery disease) -continue home meds, other workup as above  3.  Chronic diastolic heart failure ([place]) -continue home medications   4.Diabetes ([place]) -sliding scale insulin with corresponding glucose checks    5.HTN (hypertension) -stable, continue home meds    6.Obstructive sleep apnea -CPAP nightly  Overall stable D/c ok from cardiac standpoint  CONSULTS OBTAINED:  Treatment Team:  Isaias Cowman, MD  DRUG ALLERGIES:   Allergies  Allergen Reactions  . Sulfa Antibiotics Rash    DISCHARGE MEDICATIONS:   Current Discharge Medication List    CONTINUE these medications which have CHANGED   Details  isosorbide mononitrate (IMDUR) 30 MG 24 hr tablet Take 2 tablets (60 mg total) daily by mouth. Qty: 30 tablet, Refills: 0      CONTINUE these medications which have NOT CHANGED   Details  aspirin 325 MG tablet Take 325 mg by mouth daily.    furosemide (LASIX) 20 MG tablet Take 1 tablet (20 mg total) by mouth 2 (two) times daily. Qty: 60 tablet, Refills: 11    Insulin Human (INSULIN PUMP) SOLN Inject into the skin.    meloxicam (MOBIC) 7.5 MG tablet Take 7.5 mg by mouth daily.    nebivolol (BYSTOLIC) 10 MG tablet Take 10 mg by mouth daily.    prasugrel (EFFIENT) 10 MG TABS tablet TAKE ONE TABLET BY MOUTH EVERY DAY    rosuvastatin (CRESTOR) 10 MG tablet Take 1 tablet (10 mg total) by mouth daily. Qty: 30 tablet, Refills: 0        If you experience worsening of your admission symptoms, develop shortness of breath, life threatening emergency, suicidal or homicidal thoughts you must seek medical attention immediately by calling 911 or calling your MD immediately  if symptoms less severe.  You Must read complete instructions/literature along with all the possible adverse reactions/side effects for all the Medicines you take and that have been prescribed to you. Take any new Medicines after you have completely understood and accept all the possible adverse reactions/side effects.   Please note  You were cared for by a hospitalist during your hospital  stay. If you have any questions about your discharge medications or the care you received while you were in the hospital after you are discharged, you can call the unit and asked to speak with the hospitalist on call if the hospitalist that took care of you is not available. Once you are discharged, your primary care physician will handle any further medical issues. Please note that NO REFILLS for any discharge medications will be authorized once you are discharged, as it is imperative that you return to your primary care physician (or  establish a relationship with a primary care physician if you do not have one) for your aftercare needs so that they can reassess your need for medications and monitor your lab values. Today   SUBJECTIVE   No cp today  VITAL SIGNS:  Blood pressure 123/60, pulse 65, temperature 97.7 F (36.5 C), temperature source Oral, resp. rate 18, height 5\' 10"  (1.778 m), weight (!) 141.8 kg (312 lb 11.2 oz), SpO2 97 %.  I/O:    Intake/Output Summary (Last 24 hours) at 07/29/2017 1143 Last data filed at 07/29/2017 0946 Gross per 24 hour  Intake 240 ml  Output 700 ml  Net -460 ml    PHYSICAL EXAMINATION:  GENERAL:  56 y.o.-year-old patient lying in the bed with no acute distress. obese EYES: Pupils equal, round, reactive to light and accommodation. No scleral icterus. Extraocular muscles intact.  HEENT: Head atraumatic, normocephalic. Oropharynx and nasopharynx clear.  NECK:  Supple, no jugular venous distention. No thyroid enlargement, no tenderness.  LUNGS: Normal breath sounds bilaterally, no wheezing, rales,rhonchi or crepitation. No use of accessory muscles of respiration.  CARDIOVASCULAR: S1, S2 normal. No murmurs, rubs, or gallops.  ABDOMEN: Soft, non-tender, non-distended. Bowel sounds present. No organomegaly or mass.  EXTREMITIES: No pedal edema, cyanosis, or clubbing.  NEUROLOGIC: Cranial nerves II through XII are intact. Muscle strength 5/5 in all extremities. Sensation intact. Gait not checked.  PSYCHIATRIC: The patient is alert and oriented x 3.  SKIN: No obvious rash, lesion, or ulcer.   DATA REVIEW:   CBC  Recent Labs  Lab 07/29/17 0420  WBC 8.8  HGB 13.1  HCT 39.7*  PLT 187    Chemistries  Recent Labs  Lab 07/29/17 0420  NA 140  K 3.6  CL 105  CO2 27  GLUCOSE 131*  BUN 16  CREATININE 0.71  CALCIUM 9.0    Microbiology Results   No results found for this or any previous visit (from the past 240 hour(s)).  RADIOLOGY:  Dg Chest 2 View  Result Date:  07/28/2017 CLINICAL DATA:  Chest pain EXAM: CHEST  2 VIEW COMPARISON:  04/06/2017, 11/24/2012 FINDINGS: Mild cardiomegaly. Coarse interstitial opacity likely chronic. No pleural effusion. No focal consolidation. No pneumothorax. Degenerative changes of the spine. IMPRESSION: Borderline to mild cardiomegaly. Negative for edema or focal pulmonary infiltrate. Electronically Signed   By: Donavan Foil M.D.   On: 07/28/2017 18:04     Management plans discussed with the patient, family and they are in agreement.  CODE STATUS:     Code Status Orders  (From admission, onward)        Start     Ordered   07/28/17 2239  Full code  Continuous     07/28/17 2238    Code Status History    Date Active Date Inactive Code Status Order ID Comments User Context   04/05/2017 20:52 04/07/2017 21:59 Full Code [id number]  Vaughan Basta, MD ED  TOTAL TIME TAKING CARE OF THIS PATIENT: *40* minutes.    Teola Felipe M.D on 07/29/2017 at 11:43 AM  Between 7am to 6pm - Pager - [phone] After 6pm go to [email or web address] - password [email or web address] [facility]  Office  [phone]  CC: Primary care physician; [name], MD

## 2017-07-29 NOTE — Consult Note (Signed)
[facility] Cardiology  CARDIOLOGY CONSULT NOTE  Patient ID: Philip Cook MRN: [id number] DOB/AGE: December 28, 1960 56 y.o.  Admit date: 07/28/2017 Referring Physician Jannifer [facility] Primary Physician [place] Primary Cardiologist [place] Reason for Consultation Chest pain  HPI: 56 year old male referred for evaluation of chest pain.  Patient has a history of known coronary artery disease, status post Xience stent mid LAD 06/30/2010, essential hypertension, hyperlipidemia, type 2 diabetes, chronic diastolic CHF, OSA, and recent cardiac catheterization in 04/07/2017, which revealed patent stent and 70% stenosis DG 1.  The patient reports 2 recent episodes of exertional chest pain.  The first episode occurred last week while walking on the treadmill at the gym.  He felt centralized chest pain with radiation to bilateral jaw, without associated nausea, diaphoresis, or worsening shortness of breath.  If he slowed down the speed on the treadmill, his chest pain improved.  The second episode occurred yesterday while at heart track exercising on the treadmill, with similar symptoms, and the patient was advised to be evaluated in the ER.  The patient states he has never experienced this type of chest pain before. ECG revealed normal sinus rhythm at a rate of 79 bpm with incomplete right bundle branch block, without acute ST or T wave abnormalities.  Admission labs notable for negative troponin x3.  Chest x-ray negative for pleural effusion or edema.  Currently, the patient denies recurrence of chest pain or shortness of breath.  He states that he has been working hard to get in shape, exercising for a total of 5 days/week at the gym as well as heart track, and states that he has been feeling well since July after he underwent the cardiac catheterization.  He denies progressive shortness of breath, palpitations, or worsening peripheral edema.  Review of systems complete and found to be negative unless listed above     Past  Medical History:  Diagnosis Date  . CHF (congestive heart failure) ([place])   . Coronary artery disease   . Diabetes mellitus without complication ([place])   . Hyperlipidemia   . Hypertension     Past Surgical History:  Procedure Laterality Date  . CORONARY ANGIOPLASTY WITH STENT PLACEMENT      Medications Prior to Admission  Medication Sig Dispense Refill Last Dose  . aspirin 325 MG tablet Take 325 mg by mouth daily.   07/27/2017 at 0800  . furosemide (LASIX) 20 MG tablet Take 1 tablet (20 mg total) by mouth 2 (two) times daily. 60 tablet 11 07/27/2017 at 1800  . Insulin Human (INSULIN PUMP) SOLN Inject into the skin.   prn at prn  . isosorbide mononitrate (IMDUR) 30 MG 24 hr tablet Take 1 tablet (30 mg total) by mouth daily. 30 tablet 0 07/28/2017 at 0800  . meloxicam (MOBIC) 7.5 MG tablet Take 7.5 mg by mouth daily.   07/27/2017 at 0800  . nebivolol (BYSTOLIC) 10 MG tablet Take 10 mg by mouth daily.   07/28/2017 at 0800  . prasugrel (EFFIENT) 10 MG TABS tablet TAKE ONE TABLET BY MOUTH EVERY DAY   07/28/2017 at 0800  . rosuvastatin (CRESTOR) 10 MG tablet Take 1 tablet (10 mg total) by mouth daily. 30 tablet 0 07/28/2017 at 0800   Social History   Socioeconomic History  . Marital status: Married    Spouse name: Not on file  . Number of children: Not on file  . Years of education: Not on file  . Highest education level: Not on file  Social Needs  . Emergency planning/management officer  strain: Not on file  . Food insecurity - worry: Not on file  . Food insecurity - inability: Not on file  . Transportation needs - medical: Not on file  . Transportation needs - non-medical: Not on file  Occupational History  . Not on file  Tobacco Use  . Smoking status: Never Smoker  . Smokeless tobacco: Never Used  Substance and Sexual Activity  . Alcohol use: No  . Drug use: No  . Sexual activity: Not on file  Other Topics Concern  . Not on file  Social History Narrative  . Not on file    Family History  Problem  Relation Age of Onset  . CAD Father   . Heart failure Brother       Review of systems complete and found to be negative unless listed above      PHYSICAL EXAM  General: Well developed, well nourished, in no acute distress HEENT:  Normocephalic and atramatic Neck:  No JVD.  Lungs: Clear bilaterally to auscultation, normal effort of breathing. Heart: HRRR . Normal S1 and S2 without gallops or murmurs.  Abdomen: Bowel sounds are positive, abdomen soft Msk:  Back normal, sitting upright in bed. Gait not assessed. Normal strength and tone for age. Extremities: 1+ bilateral lower extremity edema Neuro: Alert and oriented X 3. Psych:  Good affect, responds appropriately  Labs:   Lab Results  Component Value Date   WBC 8.8 07/29/2017   HGB 13.1 07/29/2017   HCT 39.7 (L) 07/29/2017   MCV 87.7 07/29/2017   PLT 187 07/29/2017    Recent Labs  Lab 07/29/17 0420  NA 140  K 3.6  CL 105  CO2 27  BUN 16  CREATININE 0.71  CALCIUM 9.0  GLUCOSE 131*   Lab Results  Component Value Date   TROPONINI <0.03 07/29/2017   No results found for: CHOL No results found for: HDL No results found for: LDLCALC No results found for: TRIG No results found for: CHOLHDL No results found for: LDLDIRECT    Radiology: Dg Chest 2 View  Result Date: 07/28/2017 CLINICAL DATA:  Chest pain EXAM: CHEST  2 VIEW COMPARISON:  04/06/2017, 11/24/2012 FINDINGS: Mild cardiomegaly. Coarse interstitial opacity likely chronic. No pleural effusion. No focal consolidation. No pneumothorax. Degenerative changes of the spine. IMPRESSION: Borderline to mild cardiomegaly. Negative for edema or focal pulmonary infiltrate. Electronically Signed   By: Donavan Foil M.D.   On: 07/28/2017 18:04    EKG: Sinus rhythm, no acute ST-T wave abnormalities  ASSESSMENT AND PLAN:  1. Chest pain, with known coronary artery disease, negative serial troponin, negative ECG. 2. Coronary artery disease, status post Xience stent mid  LAD 06/30/2010, and status post recent cardiac catheterization 04/07/2017, which revealed patent stent and 70% stenosis DG 1. The patient has remained on prasugrel, and takes isosorbide mononitrate 30 mg daily. 3. Essential hypertension, well controlled now on current BP medications. 4. Hyperlipidemia, on Crestor 5. Type 2 diabetes on insulin pump  Recommendations: 1. Agree with overall therapy. 2. Increase isosorbide mononitrate to 60 mg daily. 3. Ambulate today 4. Defer repeat repeat cardiac catheterization or stress test, as patient recently underwent cardiac catheterization this 03/2017, troponin negative, ECG negative, and patient reports feeling well. 5. Continue prasugrel, Crestor, Lasix, and Bystolic 6. Cancel echocardiogram 7. Recommend follow-up with Dr. Saralyn Pilar as outpatient in 1 week.   Signed: Clabe Seal, PA-C  07/29/2017, 7:52 AM

## 2017-08-04 ENCOUNTER — Encounter: Payer: BC Managed Care – PPO | Admitting: *Deleted

## 2017-08-04 DIAGNOSIS — I5032 Chronic diastolic (congestive) heart failure: Secondary | ICD-10-CM

## 2017-08-04 DIAGNOSIS — I110 Hypertensive heart disease with heart failure: Secondary | ICD-10-CM | POA: Diagnosis not present

## 2017-08-04 NOTE — Progress Notes (Signed)
Daily Session Note  Patient Details  Name: Philip Cook MRN: [id number] Date of Birth: [birth year] Referring Provider:     Cardiac Rehab from 05/13/2017 in [facility]  Referring Provider  Paraschos      Encounter Date: 08/04/2017  Check In: Session Check In - 08/04/17 1627      Check-In   Location  [facility]-Cardiac & Pulmonary Rehab    Staff Present  Renita Papa, RN Vickki Hearing, BA, ACSM CEP, Exercise Physiologist;Carroll Enterkin, RN, BSN    Supervising physician immediately available to respond to emergencies  See telemetry face sheet for immediately available ER MD    Medication changes reported      No    Fall or balance concerns reported     No    Warm-up and Cool-down  Performed on first and last piece of equipment    Resistance Training Performed  Yes    VAD Patient?  No      Pain Assessment   Currently in Pain?  No/denies        Exercise Prescription Changes - 08/04/17 1200      Response to Exercise   Blood Pressure (Admit)  150/60    Heart Rate (Admit)  80 bpm    Heart Rate (Exercise)  113 bpm    Heart Rate (Exit)  87 bpm    Symptoms  jaw pain went to ER - they increased Imdur dose    Duration  Continue with 45 min of aerobic exercise without signs/symptoms of physical distress.    Intensity  THRR unchanged      Progression   Progression  Continue to progress workloads to maintain intensity without signs/symptoms of physical distress.    Average METs  4.55      Interval Training   Interval Training  No      Treadmill   MPH  3.2    Grade  2.5    Minutes  15    METs  4.55       Social History   Tobacco Use  Smoking Status Never Smoker  Smokeless Tobacco Never Used    Goals Met:  Independence with exercise equipment Exercise tolerated well No report of cardiac concerns or symptoms Strength training completed today  Goals Unmet:  Not Applicable  Comments: Pt able to follow exercise prescription today without  complaint.  Will continue to monitor for progression.    Dr. Emily Filbert is Medical Director for [place] and [facility].

## 2017-08-09 DIAGNOSIS — I5032 Chronic diastolic (congestive) heart failure: Secondary | ICD-10-CM

## 2017-08-09 DIAGNOSIS — I110 Hypertensive heart disease with heart failure: Secondary | ICD-10-CM | POA: Diagnosis not present

## 2017-08-09 NOTE — Progress Notes (Signed)
Daily Session Note  Patient Details  Name: Philip Cook MRN: [id number] Date of Birth: [birth year] Referring Provider:     Cardiac Rehab from 05/13/2017 in [facility] Cardiac and Pulmonary Rehab  Referring Provider  Paraschos      Encounter Date: 08/09/2017  Check In: Session Check In - 08/09/17 1730      Check-In   Location  [facility]Cardiac [facility] Pul[facility] Re[facility]    Staff Present  Renita Papa, RN Moises Blood, BS, ACSM CEP, Exercise Physiologist;Constancia Geeting Oletta Darter, [place], ACSM CEP, Exercise Physiologist    Supervising physician immediately available to respond to emergencies  See telemetry face sheet for immediately available ER MD    Medication changes reported      No    Fall or balance concerns reported     No    Warm-up and Cool-down  Performed on first and last piece of equipment    Resistance Training Performed  Yes    VAD Patient?  No      Pain Assessment   Currently in Pain?  No/denies    Multiple Pain Sites  No          Social History   Tobacco Use  Smoking Status Never Smoker  Smokeless Tobacco Never Used    Goals Met:  Independence with exercise equipment Exercise tolerated well No report of cardiac concerns or symptoms Strength training completed today  Goals Unmet:  Not Applicable  Comments: Pt able to follow exercise prescription today without complaint.  Will continue to monitor for progression.    Dr. Emily Filbert is Medical Director for [place] and [facility].

## 2017-08-18 ENCOUNTER — Telehealth: Payer: Self-pay

## 2017-08-18 NOTE — Telephone Encounter (Signed)
Daughter has strep throat so marc will not be at class today

## 2017-08-19 ENCOUNTER — Encounter: Payer: BC Managed Care – PPO | Admitting: *Deleted

## 2017-08-19 DIAGNOSIS — I110 Hypertensive heart disease with heart failure: Secondary | ICD-10-CM | POA: Diagnosis not present

## 2017-08-19 DIAGNOSIS — I5032 Chronic diastolic (congestive) heart failure: Secondary | ICD-10-CM

## 2017-08-19 NOTE — Progress Notes (Signed)
Daily Session Note  Patient Details  Name: Philip Cook MRN: [id number] Date of Birth: December 10, 1960 Referring Provider:     Cardiac Rehab from 05/13/2017 in [facility] Cardiac and Pulmonary Rehab  Referring Provider  Paraschos      Encounter Date: 08/19/2017  Check In: Session Check In - 08/19/17 1640      Check-In   Location  [facility]Cardiac [facility] Pulmonary Re[facility]    Staff Present  Earlean Shawl, BS, ACSM CEP, Exercise Physiologist;Joseph Tessie Fass RCP,RRT,BSRT;Carroll Enterkin, RN, BSN    Supervising physician immediately available to respond to emergencies  See telemetry face sheet for immediately available ER MD    Medication changes reported      No    Fall or balance concerns reported     No    Warm-up and Cool-down  Performed on first and last piece of equipment    Resistance Training Performed  Yes    VAD Patient?  No      Pain Assessment   Currently in Pain?  No/denies    Multiple Pain Sites  No          Social History   Tobacco Use  Smoking Status Never Smoker  Smokeless Tobacco Never Used    Goals Met:  Independence with exercise equipment Exercise tolerated well No report of cardiac concerns or symptoms Strength training completed today  Goals Unmet:  Not Applicable  Comments: Pt able to follow exercise prescription today without complaint.  Will continue to monitor for progression.    Dr. Emily Filbert is Medical Director for [place] and [facility].

## 2017-08-23 ENCOUNTER — Encounter: Payer: BC Managed Care – PPO | Attending: Cardiology

## 2017-08-23 DIAGNOSIS — I5032 Chronic diastolic (congestive) heart failure: Secondary | ICD-10-CM | POA: Insufficient documentation

## 2017-08-23 DIAGNOSIS — I110 Hypertensive heart disease with heart failure: Secondary | ICD-10-CM | POA: Insufficient documentation

## 2017-08-23 DIAGNOSIS — Z7982 Long term (current) use of aspirin: Secondary | ICD-10-CM | POA: Insufficient documentation

## 2017-08-23 DIAGNOSIS — Z79899 Other long term (current) drug therapy: Secondary | ICD-10-CM | POA: Insufficient documentation

## 2017-08-23 DIAGNOSIS — E785 Hyperlipidemia, unspecified: Secondary | ICD-10-CM | POA: Insufficient documentation

## 2017-08-23 DIAGNOSIS — Z9641 Presence of insulin pump (external) (internal): Secondary | ICD-10-CM | POA: Insufficient documentation

## 2017-08-23 DIAGNOSIS — I2510 Atherosclerotic heart disease of native coronary artery without angina pectoris: Secondary | ICD-10-CM | POA: Insufficient documentation

## 2017-08-25 ENCOUNTER — Encounter: Payer: BC Managed Care – PPO | Admitting: *Deleted

## 2017-08-25 ENCOUNTER — Encounter: Payer: Self-pay | Admitting: *Deleted

## 2017-08-25 DIAGNOSIS — I2510 Atherosclerotic heart disease of native coronary artery without angina pectoris: Secondary | ICD-10-CM | POA: Diagnosis not present

## 2017-08-25 DIAGNOSIS — Z7982 Long term (current) use of aspirin: Secondary | ICD-10-CM | POA: Diagnosis not present

## 2017-08-25 DIAGNOSIS — Z9641 Presence of insulin pump (external) (internal): Secondary | ICD-10-CM | POA: Diagnosis not present

## 2017-08-25 DIAGNOSIS — I5032 Chronic diastolic (congestive) heart failure: Secondary | ICD-10-CM

## 2017-08-25 DIAGNOSIS — E785 Hyperlipidemia, unspecified: Secondary | ICD-10-CM | POA: Diagnosis not present

## 2017-08-25 DIAGNOSIS — Z79899 Other long term (current) drug therapy: Secondary | ICD-10-CM | POA: Diagnosis not present

## 2017-08-25 DIAGNOSIS — I110 Hypertensive heart disease with heart failure: Secondary | ICD-10-CM | POA: Diagnosis not present

## 2017-08-25 NOTE — Progress Notes (Signed)
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 heart/lung disease and depressed mood, treatment options, and the stigmas associated with seeking treatment.   Cardiac Rehab from 08/09/2017 in [facility] Cardiac and Pulmonary Rehab  Date  07/07/17  Educator  [facility]  Instruction Review Code  1- Verbalizes Understanding      Anatomy & Physiology of the Heart: - Group verbal and written instruction and models provide basic cardiac anatomy and physiology, with the coronary electrical and arterial systems. Review of: AMI, Angina, Valve disease, Heart Failure, Cardiac Arrhythmia, Pacemakers, and the ICD.   Cardiac Rehab from 08/09/2017 in [facility] Cardiac and Pulmonary Rehab  Date  06/07/17  Educator  [facility]  Instruction Review Code  1- Verbalizes Understanding      Cardiac Procedures: - Group verbal and written instruction to review commonly prescribed medications for heart disease. Reviews the medication, class of the drug, and side effects. Includes  the steps to properly store meds and maintain the prescription regimen. (beta blockers and nitrates)   Cardiac Rehab from 08/09/2017 in [facility] Cardiac and Pulmonary Rehab  Date  08/09/17  Educator  [facility]  Instruction Review Code  1- Verbalizes Understanding      Cardiac Medications I: - Group verbal and written instruction to review commonly prescribed medications for heart disease. Reviews the medication, class of the drug, and side effects. Includes the steps to properly store meds and maintain the prescription regimen.   Cardiac Rehab from 08/09/2017 in [facility] Cardiac and Pulmonary Rehab  Date  06/21/17  Educator  [place]  Instruction Review Code  1- Verbalizes Understanding      Cardiac Medications II: -Group verbal and written instruction to review commonly prescribed medications for heart disease. Reviews the medication, class of the drug, and side effects. (all other drug classes)    Go Sex-Intimacy & Heart Disease, Get SMART - Goal Setting: - Group verbal and written instruction through game format to discuss heart disease and the return to sexual intimacy. Provides group verbal and written material to discuss and apply goal setting through the application of the S.M.A.R.T. Method.   Cardiac Rehab from 08/09/2017 in [facility] Cardiac and Pulmonary Rehab  Date  08/09/17  Educator  [facility]  Instruction Review  Code  1- Verbalizes Understanding      Other Matters of the Heart: - Provides group verbal, written materials and models to describe Heart Failure, Angina, Valve Disease, Peripheral Artery Disease, and Diabetes in the realm of heart disease. Includes description of the disease process and treatment options available to the cardiac patient.   Cardiac Rehab from 08/09/2017 in [facility] Cardiac and Pulmonary Rehab  Date  06/07/17  Educator  [facility]  Instruction Review Code  1- Verbalizes Understanding      Exercise & Equipment Safety: - Individual verbal instruction and demonstration of equipment  use and safety with use of the equipment.   Cardiac Rehab from 08/09/2017 in [facility] Cardiac and Pulmonary Rehab  Date  05/13/17  Educator  [facility]      Infection Prevention: - Provides verbal and written material to individual with discussion of infection control including proper hand washing and proper equipment cleaning during exercise session.   Cardiac Rehab from 08/09/2017 in [facility] Cardiac and Pulmonary Rehab  Date  05/13/17  Educator  [facility]      Falls Prevention: - Provides verbal and written material to individual with discussion of falls prevention and safety.   Cardiac Rehab from 08/09/2017 in [facility] Cardiac and Pulmonary Rehab  Date  05/13/17  Educator  [facility]  Instruction Review Code (retired)  2- meets goals/outcomes      Diabetes: - Individual verbal and written instruction to review signs/symptoms of diabetes, desired ranges of glucose level fasting, after meals and with exercise. Acknowledge that pre and post exercise glucose checks will be done for 3 sessions at entry of program.   Cardiac Rehab from 08/09/2017 in [facility] Cardiac and Pulmonary Rehab  Date  05/13/17  Educator  [place]      Other: -Provides group and verbal instruction on various topics (see comments)    Knowledge Questionnaire Score: Knowledge Questionnaire Score - 05/13/17 1347      Knowledge Questionnaire Score   Pre Score  21/28 Correct answers reviewed with Philip Cook       Core Components/Risk Factors/Patient Goals at Admission: Personal Goals and Risk Factors at Admission - 05/13/17 1317      Core Components/Risk Factors/Patient Goals on Admission    Weight Management  Yes    Intervention  Obesity: Provide education and appropriate resources to help participant work on and attain dietary goals.;Weight Management: Develop a combined nutrition and exercise program designed to reach desired caloric intake, while maintaining appropriate intake of nutrient and fiber, sodium and fats, and appropriate energy  expenditure required for the weight goal.;Weight Management: Provide education and appropriate resources to help participant work on and attain dietary goals.;Weight Management/Obesity: Establish reasonable short term and long term weight goals.    Admit Weight  327 lb 8 oz (148.6 kg)    Goal Weight: Short Term  323 lb (146.5 kg)    Goal Weight: Long Term  240 lb (108.9 kg)    Expected Outcomes  Short Term: Continue to assess and modify interventions until short term weight is achieved;Long Term: Adherence to nutrition and physical activity/exercise program aimed toward attainment of established weight goal;Weight Loss: Understanding of general recommendations for a balanced deficit meal plan, which promotes 1-2 lb weight loss per week and includes a negative energy balance of [phone] kcal/d;Understanding recommendations for meals to include 15-35% energy as protein, 25-35% energy from fat, 35-60% energy from carbohydrates, less than 235m of dietary cholesterol, 20-35 gm of total fiber daily;Understanding of distribution of calorie intake throughout the day  with the consumption of 4-5 meals/snacks    Improve shortness of breath with ADL's  Yes    Intervention  Provide education, individualized exercise plan and daily activity instruction to help decrease symptoms of SOB with activities of daily living.    Expected Outcomes  Short Term: Achieves a reduction of symptoms when performing activities of daily living.    Diabetes  Yes    Intervention  Provide education about signs/symptoms and action to take for hypo/hyperglycemia.;Provide education about proper nutrition, including hydration, and aerobic/resistive exercise prescription along with prescribed medications to achieve blood glucose in normal ranges: Fasting glucose 65-99 mg/dL    Expected Outcomes  Short Term: Participant verbalizes understanding of the signs/symptoms and immediate care of hyper/hypoglycemia, proper foot care and importance of  medication, aerobic/resistive exercise and nutrition plan for blood glucose control.;Long Term: Attainment of HbA1C < 7%.    Heart Failure  Yes    Intervention  Provide a combined exercise and nutrition program that is supplemented with education, support and counseling about heart failure. Directed toward relieving symptoms such as shortness of breath, decreased exercise tolerance, and extremity edema.    Expected Outcomes  Short term: Attendance in program 2-3 days a week with increased exercise capacity. Reported lower sodium intake. Reported increased fruit and vegetable intake. Reports medication compliance.;Short term: Daily weights obtained and reported for increase. Utilizing diuretic protocols set by physician.;Long term: Adoption of self-care skills and reduction of barriers for early signs and symptoms recognition and intervention leading to self-care maintenance.;Improve functional capacity of life    Lipids  Yes    Intervention  Provide education and support for participant on nutrition & aerobic/resistive exercise along with prescribed medications to achieve LDL <70m, HDL >445m    Expected Outcomes  Short Term: Participant states understanding of desired cholesterol values and is compliant with medications prescribed. Participant is following exercise prescription and nutrition guidelines.;Long Term: Cholesterol controlled with medications as prescribed, with individualized exercise RX and with personalized nutrition plan. Value goals: LDL < 7075mHDL > 40 mg.    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 With his health issues and responsibility for daughters and school, it can be difficult at times    Intervention  Offer individual and/or small group education and counseling on adjustment to heart disease, stress management and health-related lifestyle change. Teach and support self-help  strategies.;Refer participants experiencing significant psychosocial distress to appropriate mental health specialists for further evaluation and treatment. When possible, include family members and significant others in education/counseling sessions.    Expected Outcomes  Short Term: Participant demonstrates changes in health-related behavior, relaxation and other stress management skills, ability to obtain effective social support, and compliance with psychotropic medications if prescribed.;Long Term: Emotional wellbeing is indicated by absence of clinically significant psychosocial distress or social isolation.       Core Components/Risk Factors/Patient Goals Review:  Goals and Risk Factor Review    Row Name 06/30/17 0819562/25/18 1537           Core Components/Risk Factors/Patient Goals Review   Personal Goals Review  Weight Management/Obesity  Weight Management/Obesity      Review  "313lbs today". "I met with the dietician before they tell me to eat everything I hate. I am losing weight. I go to [employer] a [employer] when I am not exercising with you folks. I will be in  Cardiac Rehab this evening. My daughter has softball practice and games that keeps me from Cardiac Rehab sometimes"  [place] "Philip Cook: has lost weight. Stress due to 3 softball games of his daughters plus he works full time. Philip Cook said he is unable to attend this evening since he just needs a break to rest.      Expected Outcomes  Weight below 300lbs. Eating healthier and heart healthy lifetyle.   Heart healthy living. Weight loss.         Core Components/Risk Factors/Patient Goals at Discharge (Final Review):  Goals and Risk Factor Review - 07/15/17 1537      Core Components/Risk Factors/Patient Goals Review   Personal Goals Review  Weight Management/Obesity    Review  Gwyndolyn Saxon "Philip Cook: has lost weight. Stress due to 3 softball games of his daughters plus he works full time. Philip Cook said he is unable to attend this evening  since he just needs a break to rest.    Expected Outcomes  Heart healthy living. Weight loss.       ITP Comments: ITP Comments    Row Name 05/13/17 1313 05/19/17 1753 05/19/17 1758 06/02/17 1135 06/25/17 1323   ITP Comments  Med Review completed. Initial ITP created. Diagnosis can be found Care Everywhere 04/19/17  : First full day of exercise!  Patient was oriented to gym and equipment including functions, settings, policies, and procedures.  Patient's individual exercise prescription and treatment plan were reviewed.  All starting workloads were established based on the results of the 6 minute walk test done at initial orientation visit.  The plan for exercise progression was also introduced and progression will be customized based on patient's performance and goals.  Reviewed RPE scale, THR and program prescription with pt today.  Pt voiced understanding and was given a copy of goals to take home.   30 day review. Continue with ITP unless directed changes per Medical Director review.    I left a vm to follow up with Gust Rung" [place] about his stomach issues yesterday and also to see how Cardiac Rehab is going for him in general (ie. Short term goal to lose weight to 233lbs etc.). Follow up if he has any questions about his diabetes.   [place] Name 06/30/17 0803 07/28/17 0603 07/29/17 1056 07/29/17 1101 08/04/17 1655   ITP Comments  30 Day Review. Continue with ITP unless directed changes per Medical Director Review.   30 day review. Continue with ITP unless directed changes per Medical Director review.   Taken to [place] via wheelchair last evening. These are the MD notes from today:Chest pain, with known coronary artery disease, negative serial troponin, negative ECG. Increase Imdur. Follow up as outpatient with MD in one week.   I spoke to [employer] and he said the MD told him not to return to Cardiac Rehab until he sees his cardiologist next week.   Philip Cook returned to Cardiac Rehab  on 11/14 with a note from his doctor saying it is okay to return to rehab and exercise.    [place] Name 08/25/17 0627           ITP Comments  30 day review. Continue with ITP unless directed changes per Medical Director review.           Comments:

## 2017-08-25 NOTE — Progress Notes (Signed)
Daily Session Note  Patient Details  Name: HERRON FERO MRN: [id number] Date of Birth: 11-16-1960 Referring Provider:     Cardiac Rehab from 05/13/2017 in [facility] Cardiac and Pulmonary Rehab  Referring Provider  Paraschos      Encounter Date: 08/25/2017  Check In: Session Check In - 08/25/17 1634      Check-In   Location  [facility]-Cardiac & Pulmonary Rehab    Staff Present  Renita Papa, RN Vickki Hearing, BA, ACSM CEP, Exercise Physiologist;Carroll Enterkin, RN, BSN    Supervising physician immediately available to respond to emergencies  See telemetry face sheet for immediately available ER MD    Medication changes reported      No    Fall or balance concerns reported     No    Warm-up and Cool-down  Performed on first and last piece of equipment    Resistance Training Performed  Yes    VAD Patient?  No      Pain Assessment   Currently in Pain?  No/denies          Social History   Tobacco Use  Smoking Status Never Smoker  Smokeless Tobacco Never Used    Goals Met:  Independence with exercise equipment Exercise tolerated well No report of cardiac concerns or symptoms Strength training completed today  Goals Unmet:  Not Applicable  Comments: Pt able to follow exercise prescription today without complaint.  Will continue to monitor for progression.    Dr. Emily Filbert is Medical Director for [place] and [facility].

## 2017-08-26 ENCOUNTER — Encounter: Payer: BC Managed Care – PPO | Admitting: *Deleted

## 2017-08-26 DIAGNOSIS — I5032 Chronic diastolic (congestive) heart failure: Secondary | ICD-10-CM

## 2017-08-26 DIAGNOSIS — I110 Hypertensive heart disease with heart failure: Secondary | ICD-10-CM | POA: Diagnosis not present

## 2017-08-26 NOTE — Progress Notes (Signed)
Daily Session Note  Patient Details  Name: Philip Cook MRN: [id number] Date of Birth: 1961/09/02 Referring Provider:     Cardiac Rehab from 05/13/2017 in [facility] Cardiac and Pulmonary Rehab  Referring Provider  Paraschos      Encounter Date: 08/26/2017  Check In: Session Check In - 08/26/17 1654      Check-In   Location  [facility]-Cardiac & Pulmonary Rehab    Staff Present  Earlean Shawl, BS, ACSM CEP, Exercise Physiologist;Meredith Sherryll Burger, RN BSN;Joseph Flavia Shipper    Supervising physician immediately available to respond to emergencies  See telemetry face sheet for immediately available ER MD    Medication changes reported      No    Fall or balance concerns reported     No    Warm-up and Cool-down  Performed on first and last piece of equipment    Resistance Training Performed  Yes    VAD Patient?  No      Pain Assessment   Currently in Pain?  No/denies    Multiple Pain Sites  No          Social History   Tobacco Use  Smoking Status Never Smoker  Smokeless Tobacco Never Used    Goals Met:  Independence with exercise equipment Exercise tolerated well No report of cardiac concerns or symptoms Strength training completed today  Goals Unmet:  Not Applicable  Comments: Pt able to follow exercise prescription today without complaint.  Will continue to monitor for progression.    Dr. Emily Filbert is Medical Director for [place] and [facility].

## 2017-08-27 ENCOUNTER — Ambulatory Visit: Payer: Self-pay | Admitting: Family

## 2017-09-01 ENCOUNTER — Ambulatory Visit: Payer: Self-pay | Admitting: Family

## 2017-09-01 NOTE — Progress Notes (Deleted)
Patient ID: Philip Cook, male    DOB: 1961/07/12, 56 y.o.   MRN: [id number]  HPI  Philip Cook is a 56 y/o male with a history of HTN, hyperlipidemia, diabetes, CAD, obstructive sleep apnea and chronic heart failure.   Reviewed echo/stress test report from 12/11/16 which showed an EF of 55% along with trivial Philip/TR. Normal stress ECG results. Had a cardiac catheterization dene 04/07/17 which showed normal left ventricular function of 60%, patent proximal LAD stent, diagonal 1 and circumflex with moderate disease and RCA relatively free of disease.   Admitted 04/05/17 with chest pain and heart failure. Initially given IV diuretics. Cardiology consult obtained and catheterization done. Medication management was recommended and he was discharged home after 2 days.   He presents today for his follow-up visit with a chief complaint of mild shortness of breath upon moderate exertion. He describes this as chronic having been present for many months although he does feel like his breathing has improved. He has associated fatigue and edema along with this. Denies any weight gain.   Past Medical History:  Diagnosis Date  . CHF (congestive heart failure) ([place])   . Coronary artery disease   . Diabetes mellitus without complication ([place])   . Hyperlipidemia   . Hypertension    Past Surgical History:  Procedure Laterality Date  . CORONARY ANGIOPLASTY WITH STENT PLACEMENT    . LEFT HEART CATH AND CORONARY ANGIOGRAPHY N/A 04/07/2017   Procedure: Left Heart Cath and Coronary Angiography and possible PCI;  Surgeon: Yolonda Kida, MD;  Location: [place] CV LAB;  Service: Cardiovascular;  Laterality: N/A;   Family History  Problem Relation Age of Onset  . CAD Father   . Heart failure Brother    Social History   Tobacco Use  . Smoking status: Never Smoker  . Smokeless tobacco: Never Used  Substance Use Topics  . Alcohol use: No   Allergies  Allergen Reactions  . Sulfa Antibiotics Rash     Review of Systems  Constitutional: Positive for fatigue. Negative for appetite change.  HENT: Negative for congestion, postnasal drip and sore throat.   Eyes: Negative.   Respiratory: Positive for shortness of breath (improving). Negative for chest tightness.   Cardiovascular: Positive for leg swelling (improving). Negative for chest pain and palpitations.  Gastrointestinal: Negative for abdominal distention and abdominal pain.  Endocrine: Negative.   Genitourinary: Negative.   Musculoskeletal: Positive for arthralgias (right shoulder). Negative for back pain.  Skin: Negative.   Allergic/Immunologic: Negative.   Neurological: Negative for dizziness and light-headedness.  Hematological: Negative for adenopathy. Does not bruise/bleed easily.  Psychiatric/Behavioral: Positive for dysphoric mood (better since excercising). Negative for sleep disturbance (wearing CPAP nightly). The patient is not nervous/anxious.      Lab Results  Component Value Date   CREATININE 0.71 07/29/2017   CREATININE 0.90 07/28/2017   CREATININE 0.91 04/07/2017    Physical Exam  Constitutional: He is oriented to person, place, and time. He appears well-developed and well-nourished.  HENT:  Head: Normocephalic and atraumatic.  Neck: Normal range of motion. Neck supple. No JVD present.  Cardiovascular: Normal rate and regular rhythm.  Pulmonary/Chest: Effort normal. He has no wheezes. He has no rales.  Abdominal: Soft. He exhibits no distension. There is no tenderness.  Musculoskeletal: He exhibits edema (2+ edema in left lower leg). He exhibits no tenderness.  Neurological: He is alert and oriented to person, place, and time.  Skin: Skin is warm and dry.  Psychiatric: He  has a normal mood and affect. His behavior is normal. Thought content normal.  Nursing note and vitals reviewed.  Assessment & Plan:  1: Chronic heart failure with preserved ejection fraction- - NYHA class II - euvolemic today -  continues to weigh daily and says that he had gained some weight because he got back into bad eating habits. Has restructured his diet and dropped the pounds he had picked up.  Instructed to call for an overnight weight gain of >2 pounds or a weekly weight gain of >5 pounds - not adding salt to his food and has been reading food labels. Not eating out as much as he's been eating more at home - has more edema in his legs than previously - does wear TED hose but wears them at bedtime because he doesn't like wearing them to work when he wears shorts. Explained how they would work better if worn when he's up moving around, he says that he'll put them on as soon as he gets home from work & then take them off in the morning. Once it gets cold out and he wears pants to work, he'll wear them during the day.  - does elevate his legs when he gets home from work which does help the edema - saw cardiologist (Paraschos) 04/19/17 - continues to particpate in [employer] - does not meet ReDS vest criteria due to BMI  2: DM- - drinking glucerna - glucose this morning was 84 - saw endocrinologist ([place]) 05/14/17 - A1c on 05/07/17 was 8.0%  3: HTN- - BP looks good today - saw PCP ([place]) 01/01/17 - BMP on 04/07/17 reviewed and showed sodium 142, potassium 3.5 and GFR >60  4: Obstructive sleep apnea- - wearing CPAP nightly - sleeps in a recliner due to pain in his right shoulder  Patient did not bring his medications nor a list. Each medication was verbally reviewed with the patient and he was encouraged to bring the bottles to every visit to confirm accuracy of list.  Return in 3 months or sooner for any questions/problems before then.

## 2017-09-06 ENCOUNTER — Encounter: Payer: BC Managed Care – PPO | Admitting: *Deleted

## 2017-09-06 DIAGNOSIS — I110 Hypertensive heart disease with heart failure: Secondary | ICD-10-CM | POA: Diagnosis not present

## 2017-09-06 DIAGNOSIS — I5032 Chronic diastolic (congestive) heart failure: Secondary | ICD-10-CM

## 2017-09-06 NOTE — Progress Notes (Signed)
Daily Session Note  Patient Details  Name: Philip Cook MRN: [id number] Date of Birth: 08-26-1961 Referring Provider:     Cardiac Rehab from 05/13/2017 in [facility] Cardiac and Pulmonary Rehab  Referring Provider  Paraschos      Encounter Date: 09/06/2017  Check In: Session Check In - 09/06/17 1627      Check-In   Location  [facility]-Cardiac & Pulmonary Rehab    Staff Present  Earlean Shawl, BS, ACSM CEP, Exercise Physiologist;Other;Carroll Enterkin, RN, BSN    Supervising physician immediately available to respond to emergencies  See telemetry face sheet for immediately available ER MD    Medication changes reported      No    Fall or balance concerns reported     No    Warm-up and Cool-down  Performed on first and last piece of equipment    Resistance Training Performed  Yes    VAD Patient?  No      Pain Assessment   Currently in Pain?  No/denies    Multiple Pain Sites  No          Social History   Tobacco Use  Smoking Status Never Smoker  Smokeless Tobacco Never Used    Goals Met:  Independence with exercise equipment Exercise tolerated well No report of cardiac concerns or symptoms Strength training completed today  Goals Unmet:  Not Applicable  Comments: Pt able to follow exercise prescription today without complaint.  Will continue to monitor for progression. Goals reviewed today.     Dr. Emily Filbert is Medical Director for [place] and [facility].

## 2017-09-08 ENCOUNTER — Encounter: Payer: BC Managed Care – PPO | Admitting: *Deleted

## 2017-09-08 DIAGNOSIS — I110 Hypertensive heart disease with heart failure: Secondary | ICD-10-CM | POA: Diagnosis not present

## 2017-09-08 DIAGNOSIS — I5032 Chronic diastolic (congestive) heart failure: Secondary | ICD-10-CM

## 2017-09-08 NOTE — Progress Notes (Signed)
Daily Session Note  Patient Details  Name: Philip Cook MRN: [id number] Date of Birth: Nov 02, 1960 Referring Provider:     Cardiac Rehab from 05/13/2017 in [facility] Cardiac and Pulmonary Rehab  Referring Provider  Paraschos      Encounter Date: 09/08/2017  Check In: Session Check In - 09/08/17 1635      Check-In   Location  [facility]-Cardiac & Pulmonary Rehab    Staff Present  Renita Papa, RN Vickki Hearing, BA, ACSM CEP, Exercise Physiologist;Carroll Enterkin, RN, BSN    Supervising physician immediately available to respond to emergencies  See telemetry face sheet for immediately available ER MD    Medication changes reported      No    Fall or balance concerns reported     No    Warm-up and Cool-down  Performed on first and last piece of equipment    Resistance Training Performed  Yes    VAD Patient?  No      Pain Assessment   Currently in Pain?  No/denies          Social History   Tobacco Use  Smoking Status Never Smoker  Smokeless Tobacco Never Used    Goals Met:  Independence with exercise equipment Exercise tolerated well No report of cardiac concerns or symptoms Strength training completed today  Goals Unmet:  Not Applicable  Comments: Pt able to follow exercise prescription today without complaint.  Will continue to monitor for progression.    Dr. Emily Filbert is Medical Director for [facility] and [facility].

## 2017-09-09 DIAGNOSIS — I5032 Chronic diastolic (congestive) heart failure: Secondary | ICD-10-CM

## 2017-09-09 DIAGNOSIS — I110 Hypertensive heart disease with heart failure: Secondary | ICD-10-CM | POA: Diagnosis not present

## 2017-09-09 NOTE — Progress Notes (Signed)
Daily Session Note  Patient Details  Name: Philip Cook MRN: [id number] Date of Birth: January 10, 1961 Referring Provider:     Cardiac Rehab from 05/13/2017 in [facility] Cardiac and Pulmonary Rehab  Referring Provider  Paraschos      Encounter Date: 09/09/2017  Check In: Session Check In - 09/09/17 1613      Check-In   Location  [facility]-Cardiac & Pulmonary Rehab    Staff Present  Earlean Shawl, BS, ACSM CEP, Exercise Physiologist;Meredith Sherryll Burger, RN BSN;Joley Utecht Flavia Shipper    Supervising physician immediately available to respond to emergencies  See telemetry face sheet for immediately available ER MD    Medication changes reported      No    Fall or balance concerns reported     No    Warm-up and Cool-down  Performed on first and last piece of equipment    Resistance Training Performed  Yes    VAD Patient?  No      Pain Assessment   Currently in Pain?  No/denies          Social History   Tobacco Use  Smoking Status Never Smoker  Smokeless Tobacco Never Used    Goals Met:  Independence with exercise equipment Exercise tolerated well No report of cardiac concerns or symptoms Strength training completed today  Goals Unmet:  Not Applicable  Comments: Pt able to follow exercise prescription today without complaint.  Will continue to monitor for progression.   Dr. Emily Filbert is Medical Director for [facility] and [facility].

## 2017-09-13 ENCOUNTER — Encounter: Payer: BC Managed Care – PPO | Admitting: *Deleted

## 2017-09-13 DIAGNOSIS — I5032 Chronic diastolic (congestive) heart failure: Secondary | ICD-10-CM

## 2017-09-13 DIAGNOSIS — I110 Hypertensive heart disease with heart failure: Secondary | ICD-10-CM | POA: Diagnosis not present

## 2017-09-13 NOTE — Progress Notes (Signed)
Daily Session Note  Patient Details  Name: FORTINO HAAG MRN: [id number] Date of Birth: [birth year] Referring Provider:     Cardiac Rehab from 05/13/2017 in [facility] Cardiac and Pulmonary Rehab  Referring Provider  Paraschos      Encounter Date: 09/13/2017  Check In: Session Check In - 09/13/17 0829      Check-In   Location  [facility]-Cardiac & Pulmonary Rehab    Staff Present  Nada Maclachlan, BA, ACSM CEP, Exercise Physiologist;Brecklynn Jian Luan Pulling, MA, ACSM RCEP, Exercise Physiologist;[facility] RN,BSN    Supervising physician immediately available to respond to emergencies  See telemetry face sheet for immediately available ER MD    Medication changes reported      No    Fall or balance concerns reported     No    Warm-up and Cool-down  Performed on first and last piece of equipment    Resistance Training Performed  Yes    VAD Patient?  No      Pain Assessment   Currently in Pain?  No/denies          Social History   Tobacco Use  Smoking Status Never Smoker  Smokeless Tobacco Never Used    Goals Met:  Independence with exercise equipment Exercise tolerated well No report of cardiac concerns or symptoms Strength training completed today  Goals Unmet:  Not Applicable  Comments: Pt able to follow exercise prescription today without complaint.  Will continue to monitor for progression.    Dr. Emily Filbert is Medical Director for [facility] and [facility].

## 2017-09-15 ENCOUNTER — Encounter: Payer: BC Managed Care – PPO | Admitting: *Deleted

## 2017-09-15 VITALS — Ht 69.5 in | Wt 321.0 lb

## 2017-09-15 DIAGNOSIS — I110 Hypertensive heart disease with heart failure: Secondary | ICD-10-CM | POA: Diagnosis not present

## 2017-09-15 DIAGNOSIS — I5032 Chronic diastolic (congestive) heart failure: Secondary | ICD-10-CM

## 2017-09-15 NOTE — Progress Notes (Signed)
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 for the exercise  -  Encouraged to attend Cardiac Rehabilitation for the exercise  Stress management education  Encouraged to attend Cardiac Rehabilitation for the exercise   Continue Psychosocial Services   -  -  Follow up required by staff  -  Follow up required by staff     Initial Review   Source of Stress Concerns  Occupation  -  Occupation  Occupation  -      Psychosocial Discharge (Final Psychosocial Re-Evaluation): Psychosocial Re-Evaluation - 09/06/17 1725      Psychosocial Re-Evaluation   Current issues with  Current Stress Concerns    Comments  The program has helped him push himself to obtain his goals. His anger issues after his surgery has got alot better. His wife is seeing a doctor for depression. He wants her to come to [place] with him when he graduates [employer].    Expected Outcomes  Short: continue to exercise to reduce stress. Long: maintain a  workout routine to decrease stress.    Interventions  Encouraged to attend Cardiac Rehabilitation for the exercise    Continue Psychosocial Services   Follow up required by staff       Vocational Rehabilitation: Provide vocational rehab assistance to qualifying candidates.   Vocational Rehab Evaluation & Intervention: Vocational Rehab - 05/13/17 1347      Initial Vocational Rehab Evaluation & Intervention   Assessment shows need for Vocational Rehabilitation  No       Education: Education Goals: Education classes will be provided on a variety of topics geared toward better understanding of heart health and risk factor modification. Participant will state understanding/return demonstration of topics presented as noted by education test scores.  Learning Barriers/Preferences: Learning Barriers/Preferences - 05/13/17 1346      Learning Barriers/Preferences   Learning Barriers  None    Learning Preferences  None       Education Topics: General Nutrition Guidelines/Fats and Fiber: -Group instruction provided by verbal, written material, models and posters to present the general guidelines for heart healthy nutrition. Gives an explanation and review of dietary fats and fiber.   Cardiac Rehab from 09/15/2017 in [facility] Cardiac and Pulmonary Rehab  Date  09/06/17  Educator  PI  Instruction Review Code  1- Verbalizes Understanding      Controlling Sodium/Reading Food Labels: -Group verbal and written material supporting the discussion of sodium use in heart healthy nutrition. Review and explanation with models, verbal and written materials for utilization of the food label.   Cardiac Rehab from 09/15/2017 in [facility] Cardiac and Pulmonary Rehab  Date  09/06/17  Educator  PI  Instruction Review Code  1- Verbalizes Understanding      Exercise Physiology & Risk Factors: - Group verbal and written instruction with models to review the exercise physiology of the cardiovascular system and  associated critical values. Details cardiovascular disease risk factors and the goals associated with each risk factor.   Cardiac Rehab from 09/15/2017 in [facility] Cardiac and Pulmonary Rehab  Date  09/15/17  Educator  [facility]  Instruction Review Code  1- Verbalizes Understanding      Aerobic Exercise & Resistance Training: - Gives group verbal and written discussion on the health impact of inactivity. On the components of aerobic and resistive training programs and the benefits of this training and how to safely progress through these programs.   Cardiac Rehab from 09/15/2017 in [facility] Cardiac and Pulmonary Rehab  Date  05/31/17  Educator  [facility]  Instruction Review Code  1- Verbalizes Understanding  Flexibility, Balance, General Exercise Guidelines: - Provides group verbal and written instruction on the benefits of flexibility and balance training programs. Provides general exercise guidelines with specific guidelines to those with heart or lung disease. Demonstration and skill practice provided.   Cardiac Rehab from 09/15/2017 in [facility] Cardiac and Pulmonary Rehab  Date  06/02/17  Educator  AS  Instruction Review Code  1- Verbalizes Understanding      Stress Management: - Provides group verbal and written instruction about the health risks of elevated stress, cause of high stress, and healthy ways to reduce stress.   Cardiac Rehab from 09/15/2017 in [facility] Cardiac and Pulmonary Rehab  Date  08/04/17  Educator  [facility]  Instruction Review Code  1- Verbalizes Understanding      Depression: - Provides group verbal and written instruction on the correlation between heart/lung disease and depressed mood, treatment options, and the stigmas associated with seeking treatment.   Cardiac Rehab from 09/15/2017 in [facility] Cardiac and Pulmonary Rehab  Date  07/07/17  Educator  [facility]  Instruction Review Code  1- Verbalizes Understanding      Anatomy & Physiology of the Heart: - Group verbal and written  instruction and models provide basic cardiac anatomy and physiology, with the coronary electrical and arterial systems. Review of: AMI, Angina, Valve disease, Heart Failure, Cardiac Arrhythmia, Pacemakers, and the ICD.   Cardiac Rehab from 09/15/2017 in [facility] Cardiac and Pulmonary Rehab  Date  06/07/17  Educator  [facility]  Instruction Review Code  1- Verbalizes Understanding      Cardiac Procedures: - Group verbal and written instruction to review commonly prescribed medications for heart disease. Reviews the medication, class of the drug, and side effects. Includes the steps to properly store meds and maintain the prescription regimen. (beta blockers and nitrates)   Cardiac Rehab from 09/15/2017 in [facility] Cardiac and Pulmonary Rehab  Date  08/09/17  Educator  [facility]  Instruction Review Code  1- Verbalizes Understanding      Cardiac Medications I: - Group verbal and written instruction to review commonly prescribed medications for heart disease. Reviews the medication, class of the drug, and side effects. Includes the steps to properly store meds and maintain the prescription regimen.   [facility]  Date  06/21/17  Educator  [place]  Instruction Review Code  1- Verbalizes Understanding      Cardiac Medications II: -Group verbal and written instruction to review commonly prescribed medications for heart disease. Reviews the medication, class of the drug, and side effects. (all other drug classes)    Go Sex-Intimacy & Heart Disease, Get SMART - Goal Setting: - Group verbal and written instruction through game format to discuss heart disease and the return to sexual intimacy. Provides group verbal and written material to discuss and apply goal setting through the application of the S.M.A.R.T. Method.   Cardiac Rehab from 09/15/2017 in [facility] Cardiac and Pulmonary Rehab  Date  08/09/17  Educator  [facility]  Instruction Review Code  1- Verbalizes  Understanding      Other Matters of the Heart: - Provides group verbal, written materials and models to describe Heart Failure, Angina, Valve Disease, Peripheral Artery Disease, and Diabetes in the realm of heart disease. Includes description of the disease process and treatment options available to the cardiac patient.   Cardiac Rehab from 09/15/2017 in [facility] Cardiac and Pulmonary Rehab  Date  06/07/17  Educator  [facility]  Instruction Review Code  1- [employer]  Understanding      Exercise & Equipment Safety: - Individual verbal instruction and demonstration of equipment use and safety with use of the equipment.   Cardiac Rehab from 09/15/2017 in [facility] Cardiac and Pulmonary Rehab  Date  05/13/17  Educator  [facility]      Infection Prevention: - Provides verbal and written material to individual with discussion of infection control including proper hand washing and proper equipment cleaning during exercise session.   Cardiac Rehab from 09/15/2017 in [facility] Cardiac and Pulmonary Rehab  Date  05/13/17  Educator  [facility]      Falls Prevention: - Provides verbal and written material to individual with discussion of falls prevention and safety.   Cardiac Rehab from 09/15/2017 in [facility] Cardiac and Pulmonary Rehab  Date  05/13/17  Educator  [facility]  Instruction Review Code (retired)  2- meets goals/outcomes      Diabetes: - Individual verbal and written instruction to review signs/symptoms of diabetes, desired ranges of glucose level fasting, after meals and with exercise. Acknowledge that pre and post exercise glucose checks will be done for 3 sessions at entry of program.   Cardiac Rehab from 09/15/2017 in [facility] Cardiac and Pulmonary Rehab  Date  05/13/17  Educator  [facility]      Other: -Provides group and verbal instruction on various topics (see comments)    Knowledge Questionnaire Score: Knowledge Questionnaire Score - 09/15/17 0953      Knowledge Questionnaire Score   Pre Score  21/28    Post  Score  23/28       Core Components/Risk Factors/Patient Goals at Admission: Personal Goals and Risk Factors at Admission - 05/13/17 1317      Core Components/Risk Factors/Patient Goals on Admission    Weight Management  Yes    Intervention  Obesity: Provide education and appropriate resources to help participant work on and attain dietary goals.;Weight Management: Develop a combined nutrition and exercise program designed to reach desired caloric intake, while maintaining appropriate intake of nutrient and fiber, sodium and fats, and appropriate energy expenditure required for the weight goal.;Weight Management: Provide education and appropriate resources to help participant work on and attain dietary goals.;Weight Management/Obesity: Establish reasonable short term and long term weight goals.    Admit Weight  327 lb 8 oz (148.6 kg)    Goal Weight: Short Term  323 lb (146.5 kg)    Goal Weight: Long Term  240 lb (108.9 kg)    Expected Outcomes  Short Term: Continue to assess and modify interventions until short term weight is achieved;Long Term: Adherence to nutrition and physical activity/exercise program aimed toward attainment of established weight goal;Weight Loss: Understanding of general recommendations for a balanced deficit meal plan, which promotes 1-2 lb weight loss per week and includes a negative energy balance of [phone] kcal/d;Understanding recommendations for meals to include 15-35% energy as protein, 25-35% energy from fat, 35-60% energy from carbohydrates, less than 265m of dietary cholesterol, 20-35 gm of total fiber daily;Understanding of distribution of calorie intake throughout the day with the consumption of 4-5 meals/snacks    Improve shortness of breath with ADL's  Yes    Intervention  Provide education, individualized exercise plan and daily activity instruction to help decrease symptoms of SOB with activities of daily living.    Expected Outcomes  Short Term: Achieves a  reduction of symptoms when performing activities of daily living.    Diabetes  Yes    Intervention  Provide education about signs/symptoms and action to take  for hypo/hyperglycemia.;Provide education about proper nutrition, including hydration, and aerobic/resistive exercise prescription along with prescribed medications to achieve blood glucose in normal ranges: Fasting glucose 65-99 mg/dL    Expected Outcomes  Short Term: Participant verbalizes understanding of the signs/symptoms and immediate care of hyper/hypoglycemia, proper foot care and importance of medication, aerobic/resistive exercise and nutrition plan for blood glucose control.;Long Term: Attainment of HbA1C < 7%.    Heart Failure  Yes    Intervention  Provide a combined exercise and nutrition program that is supplemented with education, support and counseling about heart failure. Directed toward relieving symptoms such as shortness of breath, decreased exercise tolerance, and extremity edema.    Expected Outcomes  Short term: Attendance in program 2-3 days a week with increased exercise capacity. Reported lower sodium intake. Reported increased fruit and vegetable intake. Reports medication compliance.;Short term: Daily weights obtained and reported for increase. Utilizing diuretic protocols set by physician.;Long term: Adoption of self-care skills and reduction of barriers for early signs and symptoms recognition and intervention leading to self-care maintenance.;Improve functional capacity of life    Lipids  Yes    Intervention  Provide education and support for participant on nutrition & aerobic/resistive exercise along with prescribed medications to achieve LDL <16m, HDL >428m    Expected Outcomes  Short Term: Participant states understanding of desired cholesterol values and is compliant with medications prescribed. Participant is following exercise prescription and nutrition guidelines.;Long Term: Cholesterol controlled with  medications as prescribed, with individualized exercise RX and with personalized nutrition plan. Value goals: LDL < 7061mHDL > 40 mg.    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 With his health issues and responsibility for daughters and school, it can be difficult at times    Intervention  Offer individual and/or small group education and counseling on adjustment to heart disease, stress management and health-related lifestyle change. Teach and support self-help strategies.;Refer participants experiencing significant psychosocial distress to appropriate mental health specialists for further evaluation and treatment. When possible, include family members and significant others in education/counseling sessions.    Expected Outcomes  Short Term: Participant demonstrates changes in health-related behavior, relaxation and other stress management skills, ability to obtain effective social support, and compliance with psychotropic medications if prescribed.;Long Term: Emotional wellbeing is indicated by absence of clinically significant psychosocial distress or social isolation.       Core Components/Risk Factors/Patient Goals Review:  Goals and Risk Factor Review    Row Name 06/30/17 0817017/25/18 1537 09/06/17 1709         Core Components/Risk Factors/Patient Goals Review   Personal Goals Review  Weight Management/Obesity  Weight Management/Obesity  Weight Management/Obesity;Heart Failure;Improve shortness of breath with ADL's;Diabetes     Review  "313lbs today". "I met with the dietician before they tell me to eat everything I hate. I am losing weight. [place] [employer] [place] I am not exercising with you folks. I will be in Cardiac Rehab this evening. My daughter has softball practice and games that keeps me from Cardiac Rehab sometimes"  WilGriffitharElta Guadel[place]as lost weight. Stress due to 3 softball  games of his daughters plus he works full time. Mar[place]ta [place] he is unable to attend this evening since he just needs a break to rest.  MarAltamese Dillings lost about 4 pounds since he was [place] in [place] takes lasix which  helps his blood pressure. He has been checking his blood pressure seldome at home. His ADLs are good except for climbing stairs.     Expected Outcomes  Weight below 300lbs. Eating healthier and heart healthy lifetyle.   Heart healthy living. Weight loss.  Short: check blood pressure at home. Long: check blood pressure at home independently.         Core Components/Risk Factors/Patient Goals at Discharge (Final Review):  Goals and Risk Factor Review - 09/06/17 1709      Core Components/Risk Factors/Patient Goals Review   Personal Goals Review  Weight Management/Obesity;Heart Failure;Improve shortness of breath with ADL's;Diabetes    Review  Philip Cook has lost about 4 pounds since he was last in [place]. He takes lasix which helps his blood pressure. He has been checking his blood pressure seldome at home. His ADLs are good except for climbing stairs.    Expected Outcomes  Short: check blood pressure at home. Long: check blood pressure at home independently.        ITP Comments: ITP Comments    Row Name 05/13/17 1313 05/19/17 1753 05/19/17 1758 06/02/17 1135 06/25/17 1323   ITP Comments  Med Review completed. Initial ITP created. Diagnosis can be found Care Everywhere 04/19/17  : First full day of exercise!  Patient was oriented to gym and equipment including functions, settings, policies, and procedures.  Patient's individual exercise prescription and treatment plan were reviewed.  All starting workloads were established based on the results of the 6 minute walk test done at initial orientation visit.  The plan for exercise progression was also introduced and progression will be customized based on patient's performance and goals.  Reviewed RPE scale, THR and program prescription with pt  today.  Pt voiced understanding and was given a copy of goals to take home.   30 day review. Continue with ITP unless directed changes per Medical Director review.    I left a vm to follow up with Gust Rung" Simien about his stomach issues yesterday and also to see how Cardiac Rehab is going for him in general (ie. Short term goal to lose weight to 233lbs etc.). Follow up if he has any questions about his diabetes.   [place] Name 06/30/17 0803 07/28/17 0603 07/29/17 1056 07/29/17 1101 08/04/17 1655   ITP Comments  30 Day Review. Continue with ITP unless directed changes per Medical Director Review.   30 day review. Continue with ITP unless directed changes per Medical Director review.   Taken to [place] via wheelchair last evening. These are the MD notes from today:Chest pain, with known coronary artery disease, negative serial troponin, negative ECG. Increase Imdur. Follow up as outpatient with MD in one week.   I spoke to [employer] and he said the MD told him not to return to Cardiac Rehab until he sees his cardiologist next week.   Philip Cook returned to Cardiac Rehab on 11/14 with a note from his doctor saying it is okay to return to rehab and exercise.    [place] Name 08/25/17 [phone] 09/15/17 1356         ITP Comments  30 day review. Continue with ITP unless directed changes per Medical Director review.   Discharge ITP sent and signed by Dr. Sabra Heck.  Discharge Summary routed to PCP and cardiologist.         Comments: Discharge ITP

## 2017-09-15 NOTE — Progress Notes (Signed)
Discharge Progress Report  Patient Details  Name: Philip Cook MRN: [id number] Date of Birth: 1961-01-25 Referring Provider:     Cardiac Rehab from 05/13/2017 in [facility]  Referring Provider  Paraschos       Number of Visits: 33/36  Reason for Discharge:  Patient reached a stable level of exercise. Patient independent in their exercise. Patient has met program and personal goals.  Smoking History:  Social History   Tobacco Use  Smoking Status Never Smoker  Smokeless Tobacco Never Used    Diagnosis:  Heart failure, diastolic, chronic (HCC)  ADL UCSD:   Initial Exercise Prescription: Initial Exercise Prescription - 05/13/17 1500      Date of Initial Exercise RX and Referring Provider   Date  05/13/17    Referring Provider  Paraschos      Treadmill   MPH  2.3    Grade  1    Minutes  15    METs  3      Recumbant Bike   Level  4    RPM  60    Watts  50    Minutes  15    METs  3.17      Recumbant Elliptical   Level  2    RPM  50    Minutes  15    METs  3      REL-XR   Level  3    Speed  50    Minutes  15    METs  3      T5 Nustep   Level  3    SPM  80    Minutes  15    METs  3      Prescription Details   Frequency (times per week)  3    Duration  Progress to 30 minutes of continuous aerobic without signs/symptoms of physical distress      Intensity   THRR 40-80% of Max Heartrate  106-146    Ratings of Perceived Exertion  11-13      Resistance Training   Training Prescription  Yes    Weight  4    Reps  10-15       Discharge Exercise Prescription (Final Exercise Prescription Changes): Exercise Prescription Changes - 09/03/17 1200      Response to Exercise   Blood Pressure (Admit)  120/60    Blood Pressure (Exercise)  144/60    Blood Pressure (Exit)  124/62    Heart Rate (Admit)  66 bpm    Heart Rate (Exercise)  113 bpm    Heart Rate (Exit)  81 bpm    Rating of Perceived Exertion (Exercise)  14    Symptoms  none    Duration  Continue with 45 min of aerobic exercise without signs/symptoms of physical distress.    Intensity  THRR unchanged      Progression   Progression  Continue to progress workloads to maintain intensity without signs/symptoms of physical distress.    Average METs  3.4      Resistance Training   Training Prescription  Yes    Weight  7 lb    Reps  10-15      Interval Training   Interval Training  No      Treadmill   MPH  3.2    Grade  2.5    Minutes  15    METs  4.77      Arm Ergometer   Level  2.3    Minutes  15    METs  3.1      T5 Nustep   Level  7    SPM  80    Minutes  15    METs  2.4      Home Exercise Plan   Plans to continue exercise at  [employer] (comment) [employer]    Frequency  Add 3 additional days to program exercise sessions.       Functional Capacity: 6 Minute Walk    Row Name 05/13/17 1500 09/15/17 0837       6 Minute Walk   Phase  Initial  Discharge    Distance  1410 feet  1740 feet    Distance % Change  -  23.4 %    Distance Feet Change  -  330 ft    Walk Time  6 minutes  6 minutes    # of Rest Breaks  0  0    MPH  2.67  3.29    METS  3.06  3.5    RPE  11  13    VO2 Peak  10.74  12.3    Symptoms  No  Yes (comment)    Comments  -  shins burning, in new shoes that don't fit right    Resting HR  66 bpm  83 bpm    Resting BP  136/72  124/72    Max Ex. HR  115 bpm  119 bpm    Max Ex. BP  154/68  124/64       Psychological, QOL, Others - Outcomes: PHQ 2/9: Depression screen [facility] 2/9 09/15/2017 05/21/2017 05/13/2017 04/19/2017  Decreased Interest 1 0 0 0  Down, Depressed, Hopeless 0 0 0 0  PHQ - 2 Score 1 0 0 0  Altered sleeping 0 - 0 -  Tired, decreased energy 1 - 1 -  Change in appetite 1 - 1 -  Feeling bad or failure about yourself  1 - 0 -  Trouble concentrating 0 - 0 -  Moving slowly or fidgety/restless 0 - 0 -  Suicidal thoughts 0 - 0 -  PHQ-9 Score 4 - 2 -  Difficult doing work/chores Not  difficult at all - Not difficult at all -    Quality of Life: Quality of Life - 09/15/17 0954      Quality of Life Scores   Health/Function Pre  14.73 %    Health/Function Post  20.6 %    Health/Function % Change  39.85 %    Socioeconomic Pre  24.21 %    Socioeconomic Post  22.57 %    Socioeconomic % Change   -6.77 %    Psych/Spiritual Pre  24.86 %    Psych/Spiritual Post  24.47 %    Psych/Spiritual % Change  -1.57 %    Family Pre  21.6 %    Family Post  22.8 %    Family % Change  5.56 %    GLOBAL Pre  20.09 %    GLOBAL Post  22.15 %    GLOBAL % Change  10.25 %       Personal Goals: Goals established at orientation with interventions provided to work toward goal. Personal Goals and Risk Factors at Admission - 05/13/17 1317      Core Components/Risk Factors/Patient Goals on Admission    Weight Management  Yes    Intervention  Obesity: Provide education and appropriate resources to help participant work  on and attain dietary goals.;Weight Management: Develop a combined nutrition and exercise program designed to reach desired caloric intake, while maintaining appropriate intake of nutrient and fiber, sodium and fats, and appropriate energy expenditure required for the weight goal.;Weight Management: Provide education and appropriate resources to help participant work on and attain dietary goals.;Weight Management/Obesity: Establish reasonable short term and long term weight goals.    Admit Weight  327 lb 8 oz (148.6 kg)    Goal Weight: Short Term  323 lb (146.5 kg)    Goal Weight: Long Term  240 lb (108.9 kg)    Expected Outcomes  Short Term: Continue to assess and modify interventions until short term weight is achieved;Long Term: Adherence to nutrition and physical activity/exercise program aimed toward attainment of established weight goal;Weight Loss: Understanding of general recommendations for a balanced deficit meal plan, which promotes 1-2 lb weight loss per week and includes  a negative energy balance of [phone] kcal/d;Understanding recommendations for meals to include 15-35% energy as protein, 25-35% energy from fat, 35-60% energy from carbohydrates, less than 238m of dietary cholesterol, 20-35 gm of total fiber daily;Understanding of distribution of calorie intake throughout the day with the consumption of 4-5 meals/snacks    Improve shortness of breath with ADL's  Yes    Intervention  Provide education, individualized exercise plan and daily activity instruction to help decrease symptoms of SOB with activities of daily living.    Expected Outcomes  Short Term: Achieves a reduction of symptoms when performing activities of daily living.    Diabetes  Yes    Intervention  Provide education about signs/symptoms and action to take for hypo/hyperglycemia.;Provide education about proper nutrition, including hydration, and aerobic/resistive exercise prescription along with prescribed medications to achieve blood glucose in normal ranges: Fasting glucose 65-99 mg/dL    Expected Outcomes  Short Term: Participant verbalizes understanding of the signs/symptoms and immediate care of hyper/hypoglycemia, proper foot care and importance of medication, aerobic/resistive exercise and nutrition plan for blood glucose control.;Long Term: Attainment of HbA1C < 7%.    Heart Failure  Yes    Intervention  Provide a combined exercise and nutrition program that is supplemented with education, support and counseling about heart failure. Directed toward relieving symptoms such as shortness of breath, decreased exercise tolerance, and extremity edema.    Expected Outcomes  Short term: Attendance in program 2-3 days a week with increased exercise capacity. Reported lower sodium intake. Reported increased fruit and vegetable intake. Reports medication compliance.;Short term: Daily weights obtained and reported for increase. Utilizing diuretic protocols set by physician.;Long term: Adoption of self-care  skills and reduction of barriers for early signs and symptoms recognition and intervention leading to self-care maintenance.;Improve functional capacity of life    Lipids  Yes    Intervention  Provide education and support for participant on nutrition & aerobic/resistive exercise along with prescribed medications to achieve LDL <783m HDL >4027m   Expected Outcomes  Short Term: Participant states understanding of desired cholesterol values and is compliant with medications prescribed. Participant is following exercise prescription and nutrition guidelines.;Long Term: Cholesterol controlled with medications as prescribed, with individualized exercise RX and with personalized nutrition plan. Value goals: LDL < 59m47mDL > 40 mg.    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 With his health issues and responsibility for daughters and school, it can be difficult at times  Intervention  Offer individual and/or small group education and counseling on adjustment to heart disease, stress management and health-related lifestyle change. Teach and support self-help strategies.;Refer participants experiencing significant psychosocial distress to appropriate mental health specialists for further evaluation and treatment. When possible, include family members and significant others in education/counseling sessions.    Expected Outcomes  Short Term: Participant demonstrates changes in health-related behavior, relaxation and other stress management skills, ability to obtain effective social support, and compliance with psychotropic medications if prescribed.;Long Term: Emotional wellbeing is indicated by absence of clinically significant psychosocial distress or social isolation.        Personal Goals Discharge: Goals and Risk Factor Review    Row Name 06/30/17 2841 07/15/17 1537 09/06/17 1709         Core Components/Risk  Factors/Patient Goals Review   Personal Goals Review  Weight Management/Obesity  Weight Management/Obesity  Weight Management/Obesity;Heart Failure;Improve shortness of breath with ADL's;Diabetes     Review  "313lbs today". "I met with the dietician before they tell me to eat everything I hate. I am losing weight. I go to [employer] 3 days a week when I am not exercising with you folks. I will be in Cardiac Rehab this evening. My daughter has softball practice and games that keeps me from Cardiac Rehab sometimes"  Ruhaan "Elta Guadeloupe: has lost weight. Stress due to 3 softball games of his daughters plus he works full time. Elta [place] said he is unable to attend this evening since he just needs a break to rest.  [name] has lost about 4 pounds since he was last in [place]. He takes lasix which helps his blood pressure. He has been checking his blood pressure seldome at home. His ADLs are good except for climbing stairs.     Expected Outcomes  Weight below 300lbs. Eating healthier and heart healthy lifetyle.   Heart healthy living. Weight loss.  Short: check blood pressure at home. Long: check blood pressure at home independently.         Exercise Goals and Review: Exercise Goals    Row Name 05/13/17 1500             Exercise Goals   Increase Physical Activity  Yes       Intervention  Provide advice, education, support and counseling about physical activity/exercise needs.;Develop an individualized exercise prescription for aerobic and resistive training based on initial evaluation findings, risk stratification, comorbidities and participant's personal goals.       Expected Outcomes  Achievement of increased cardiorespiratory fitness and enhanced flexibility, muscular endurance and strength shown through measurements of functional capacity and personal statement of participant.       Increase Strength and Stamina  Yes       Intervention  Provide advice, education, support and counseling about physical  activity/exercise needs.;Develop an individualized exercise prescription for aerobic and resistive training based on initial evaluation findings, risk stratification, comorbidities and participant's personal goals.       Expected Outcomes  Achievement of increased cardiorespiratory fitness and enhanced flexibility, muscular endurance and strength shown through measurements of functional capacity and personal statement of participant.          Nutrition & Weight - Outcomes: Pre Biometrics - 05/13/17 1459      Pre Biometrics   Height  5' 9.5" (1.765 m)    Weight  325 lb 11.2 oz (147.7 kg)  (Abnormal)     Waist Circumference  51.5 inches    Hip Circumference  57.25 inches  Waist to Hip Ratio  0.9 %    BMI (Calculated)  47.42    Single Leg Stand  30 seconds      Post Biometrics - 09/15/17 0840       Post  Biometrics   Height  5' 9.5" (1.765 m)    Weight  321 lb (145.6 kg)  (Abnormal)     Waist Circumference  48 inches    Hip Circumference  52 inches    Waist to Hip Ratio  0.92 %    BMI (Calculated)  46.74    Single Leg Stand  30 seconds       Nutrition: Nutrition Therapy & Goals - 06/30/17 0810      Nutrition Therapy   RD appointment defered  Yes "I met with the dietician before they tell me to eat everything I hate. I am losing weight"       Nutrition Discharge: Nutrition Assessments - 09/15/17 0954      MEDFICTS Scores   Pre Score  65    Post Score  78    Score Difference  13       Education Questionnaire Score: Knowledge Questionnaire Score - 09/15/17 0953      Knowledge Questionnaire Score   Pre Score  21/28    Post Score  23/28       Goals reviewed with patient; copy given to patient.

## 2017-09-15 NOTE — Progress Notes (Signed)
Daily Session Note  Patient Details  Name: Philip Cook MRN: [id number] Date of Birth: 12/17/1960 Referring Provider:     Cardiac Rehab from 05/13/2017 in [facility]  Referring Provider  Paraschos      Encounter Date: 09/15/2017  Check In: Session Check In - 09/15/17 0804      Check-In   Location  [facility]-Cardiac & Pulmonary Rehab    Staff Present  Heath Lark, RN, BSN, CCRP;Brayten Komar [place], MA, ACSM RCEP, Exercise Physiologist;Other Joellyn Rued, BS, [place]     Supervising physician immediately available to respond to emergencies  See telemetry face sheet for immediately available ER MD    Medication changes reported      No    Fall or balance concerns reported     No    Tobacco Cessation  No Change    Warm-up and Cool-down  Performed on first and last piece of equipment    Resistance Training Performed  Yes    VAD Patient?  No      Pain Assessment   Currently in Pain?  No/denies    Multiple Pain Sites  No          Social History   Tobacco Use  Smoking Status Never Smoker  Smokeless Tobacco Never Used    Goals Met:  Independence with exercise equipment Exercise tolerated well No report of cardiac concerns or symptoms Strength training completed today  Goals Unmet:  Not Applicable  Comments: Pt able to follow exercise prescription today without complaint.  Will continue to monitor for progression.  Philip Cook graduated today from cardiac rehab with 36 sessions completed.  Details of the patient's exercise prescription and what He needs to do in order to continue the prescription and progress were discussed with patient.  Patient was given a copy of prescription and goals.  Patient verbalized understanding.  Philip Cook plans to continue to exercise by going to [employer]. [place] Name 05/13/17 1500 09/15/17 0837       6 Minute Walk   Phase  Initial  Discharge    Distance  1410 feet  1740 feet    Distance % Change  -  23.4 %    Distance Feet Change  -  330 ft    Walk Time  6 minutes  6 minutes    # of Rest Breaks  0  0    MPH  2.67  3.29    METS  3.06  3.5    RPE  11  13    VO2 Peak  10.74  12.3    Symptoms  No  Yes (comment)    Comments  -  shins burning, in new shoes that don't fit right    Resting HR  66 bpm  83 bpm    Resting BP  136/72  124/72    Max Ex. HR  115 bpm  119 bpm    Max Ex. BP  154/68  124/64         Dr. Emily Filbert is Medical Director for [facility].

## 2017-09-15 NOTE — Patient Instructions (Signed)
Discharge Instructions  Patient Details  Name: Philip Cook MRN: [id number] Date of Birth: [birth year] Referring Provider:  Isaias Cowman, MD   Number of Visits: 36/36  Reason for Discharge:  Patient reached a stable level of exercise. Patient independent in their exercise. Patient has met program and personal goals.  Smoking History:  Social History   Tobacco Use  Smoking Status Never Smoker  Smokeless Tobacco Never Used    Diagnosis:  Heart failure, diastolic, chronic (HCC)  Initial Exercise Prescription: Initial Exercise Prescription - 05/13/17 1500      Date of Initial Exercise RX and Referring Provider   Date  05/13/17    Referring Provider  Paraschos      Treadmill   MPH  2.3    Grade  1    Minutes  15    METs  3      Recumbant Bike   Level  4    RPM  60    Watts  50    Minutes  15    METs  3.17      Recumbant Elliptical   Level  2    RPM  50    Minutes  15    METs  3      REL-XR   Level  3    Speed  50    Minutes  15    METs  3      T5 Nustep   Level  3    SPM  80    Minutes  15    METs  3      Prescription Details   Frequency (times per week)  3    Duration  Progress to 30 minutes of continuous aerobic without signs/symptoms of physical distress      Intensity   THRR 40-80% of Max Heartrate  106-146    Ratings of Perceived Exertion  11-13      Resistance Training   Training Prescription  Yes    Weight  4    Reps  10-15       Discharge Exercise Prescription (Final Exercise Prescription Changes): Exercise Prescription Changes - 09/03/17 1200      Response to Exercise   Blood Pressure (Admit)  120/60    Blood Pressure (Exercise)  144/60    Blood Pressure (Exit)  124/62    Heart Rate (Admit)  66 bpm    Heart Rate (Exercise)  113 bpm    Heart Rate (Exit)  81 bpm    Rating of Perceived Exertion (Exercise)  14    Symptoms  none    Duration  Continue with 45 min of aerobic exercise without signs/symptoms of physical  distress.    Intensity  THRR unchanged      Progression   Progression  Continue to progress workloads to maintain intensity without signs/symptoms of physical distress.    Average METs  3.4      Resistance Training   Training Prescription  Yes    Weight  7 lb    Reps  10-15      Interval Training   Interval Training  No      Treadmill   MPH  3.2    Grade  2.5    Minutes  15    METs  4.77      Arm Ergometer   Level  2.3    Minutes  15    METs  3.1      T5 Nustep  Level  7    SPM  80    Minutes  15    METs  2.4      Home Exercise Plan   Plans to continue exercise at  [employer] (comment) [employer]    Frequency  Add 3 additional days to program exercise sessions.       Functional Capacity: 6 Minute Walk    Row Name 05/13/17 1500 09/15/17 0837       6 Minute Walk   Phase  Initial  Discharge    Distance  1410 feet  1740 feet    Distance % Change  -  23.4 %    Distance Feet Change  -  330 ft    Walk Time  6 minutes  6 minutes    # of Rest Breaks  0  0    MPH  2.67  3.29    METS  3.06  3.5    RPE  11  13    VO2 Peak  10.74  12.3    Symptoms  No  Yes (comment)    Comments  -  shins burning, in new shoes that don't fit right    Resting HR  66 bpm  83 bpm    Resting BP  136/72  124/72    Max Ex. HR  115 bpm  119 bpm    Max Ex. BP  154/68  124/64       Quality of Life: Quality of Life - 05/13/17 1333      Quality of Life Scores   Health/Function Pre  14.73 %    Socioeconomic Pre  24.21 %    Psych/Spiritual Pre  24.86 %    Family Pre  21.6 %    GLOBAL Pre  20.09 %       Personal Goals: Goals established at orientation with interventions provided to work toward goal. Personal Goals and Risk Factors at Admission - 05/13/17 1317      Core Components/Risk Factors/Patient Goals on Admission    Weight Management  Yes    Intervention  Obesity: Provide education and appropriate resources to help participant work on and attain dietary  goals.;Weight Management: Develop a combined nutrition and exercise program designed to reach desired caloric intake, while maintaining appropriate intake of nutrient and fiber, sodium and fats, and appropriate energy expenditure required for the weight goal.;Weight Management: Provide education and appropriate resources to help participant work on and attain dietary goals.;Weight Management/Obesity: Establish reasonable short term and long term weight goals.    Admit Weight  327 lb 8 oz (148.6 kg)    Goal Weight: Short Term  323 lb (146.5 kg)    Goal Weight: Long Term  240 lb (108.9 kg)    Expected Outcomes  Short Term: Continue to assess and modify interventions until short term weight is achieved;Long Term: Adherence to nutrition and physical activity/exercise program aimed toward attainment of established weight goal;Weight Loss: Understanding of general recommendations for a balanced deficit meal plan, which promotes 1-2 lb weight loss per week and includes a negative energy balance of [phone] kcal/d;Understanding recommendations for meals to include 15-35% energy as protein, 25-35% energy from fat, 35-60% energy from carbohydrates, less than 265m of dietary cholesterol, 20-35 gm of total fiber daily;Understanding of distribution of calorie intake throughout the day with the consumption of 4-5 meals/snacks    Improve shortness of breath with ADL's  Yes    Intervention  Provide education, individualized exercise plan and daily  activity instruction to help decrease symptoms of SOB with activities of daily living.    Expected Outcomes  Short Term: Achieves a reduction of symptoms when performing activities of daily living.    Diabetes  Yes    Intervention  Provide education about signs/symptoms and action to take for hypo/hyperglycemia.;Provide education about proper nutrition, including hydration, and aerobic/resistive exercise prescription along with prescribed medications to achieve blood glucose in  normal ranges: Fasting glucose 65-99 mg/dL    Expected Outcomes  Short Term: Participant verbalizes understanding of the signs/symptoms and immediate care of hyper/hypoglycemia, proper foot care and importance of medication, aerobic/resistive exercise and nutrition plan for blood glucose control.;Long Term: Attainment of HbA1C < 7%.    Heart Failure  Yes    Intervention  Provide a combined exercise and nutrition program that is supplemented with education, support and counseling about heart failure. Directed toward relieving symptoms such as shortness of breath, decreased exercise tolerance, and extremity edema.    Expected Outcomes  Short term: Attendance in program 2-3 days a week with increased exercise capacity. Reported lower sodium intake. Reported increased fruit and vegetable intake. Reports medication compliance.;Short term: Daily weights obtained and reported for increase. Utilizing diuretic protocols set by physician.;Long term: Adoption of self-care skills and reduction of barriers for early signs and symptoms recognition and intervention leading to self-care maintenance.;Improve functional capacity of life    Lipids  Yes    Intervention  Provide education and support for participant on nutrition & aerobic/resistive exercise along with prescribed medications to achieve LDL <39m, HDL >440m    Expected Outcomes  Short Term: Participant states understanding of desired cholesterol values and is compliant with medications prescribed. Participant is following exercise prescription and nutrition guidelines.;Long Term: Cholesterol controlled with medications as prescribed, with individualized exercise RX and with personalized nutrition plan. Value goals: LDL < 7047mHDL > 40 mg.    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 With his health issues and responsibility for daughters and school, it can be difficult at  times    Intervention  Offer individual and/or small group education and counseling on adjustment to heart disease, stress management and health-related lifestyle change. Teach and support self-help strategies.;Refer participants experiencing significant psychosocial distress to appropriate mental health specialists for further evaluation and treatment. When possible, include family members and significant others in education/counseling sessions.    Expected Outcomes  Short Term: Participant demonstrates changes in health-related behavior, relaxation and other stress management skills, ability to obtain effective social support, and compliance with psychotropic medications if prescribed.;Long Term: Emotional wellbeing is indicated by absence of clinically significant psychosocial distress or social isolation.        Personal Goals Discharge: Goals and Risk Factor Review - 09/06/17 1709      Core Components/Risk Factors/Patient Goals Review   Personal Goals Review  Weight Management/Obesity;Heart Failure;Improve shortness of breath with ADL's;Diabetes    Review  MarAltamese Dillings lost about 4 pounds since he was [place] in [place] takes lasix which helps his blood pressure. He has been checking his blood pressure seldome at home. His ADLs are good except for climbing stairs.    Expected Outcomes  Short: check blood pressure at home. Long: check blood pressure at home independently.        Exercise Goals and Review: Exercise Goals    Row Name 05/13/17 1500  Exercise Goals   Increase Physical Activity  Yes       Intervention  Provide advice, education, support and counseling about physical activity/exercise needs.;Develop an individualized exercise prescription for aerobic and resistive training based on initial evaluation findings, risk stratification, comorbidities and participant's personal goals.       Expected Outcomes  Achievement of increased cardiorespiratory fitness and  enhanced flexibility, muscular endurance and strength shown through measurements of functional capacity and personal statement of participant.       Increase Strength and Stamina  Yes       Intervention  Provide advice, education, support and counseling about physical activity/exercise needs.;Develop an individualized exercise prescription for aerobic and resistive training based on initial evaluation findings, risk stratification, comorbidities and participant's personal goals.       Expected Outcomes  Achievement of increased cardiorespiratory fitness and enhanced flexibility, muscular endurance and strength shown through measurements of functional capacity and personal statement of participant.          Nutrition & Weight - Outcomes: Pre Biometrics - 05/13/17 1459      Pre Biometrics   Height  5' 9.5" (1.765 m)    Weight  325 lb 11.2 oz (147.7 kg)  (Abnormal)     Waist Circumference  51.5 inches    Hip Circumference  57.25 inches    Waist to Hip Ratio  0.9 %    BMI (Calculated)  47.42    Single Leg Stand  30 seconds      Post Biometrics - 09/15/17 0840       Post  Biometrics   Height  5' 9.5" (1.765 m)    Weight  321 lb (145.6 kg)  (Abnormal)     Waist Circumference  48 inches    Hip Circumference  52 inches    Waist to Hip Ratio  0.92 %    BMI (Calculated)  46.74    Single Leg Stand  30 seconds       Nutrition: Nutrition Therapy & Goals - 06/30/17 0810      Nutrition Therapy   RD appointment defered  Yes "I met with the dietician before they tell me to eat everything I hate. I am losing weight"       Nutrition Discharge: Nutrition Assessments - 05/13/17 1330      MEDFICTS Scores   Pre Score  65       Education Questionnaire Score: Knowledge Questionnaire Score - 05/13/17 1347      Knowledge Questionnaire Score   Pre Score  21/28 Correct answers reviewed with [name]       Goals reviewed with patient; copy given to patient.

## 2017-09-27 NOTE — Progress Notes (Signed)
Patient ID: Philip Cook, male    DOB: 10-20-1960, 57 y.o.   MRN: [id number]  HPI  Mr Bradt is a 57 y/o male with a history of HTN, hyperlipidemia, diabetes, CAD, obstructive sleep apnea and chronic heart failure.   Reviewed echo/stress test report from 12/11/16 which showed an EF of 55% along with trivial MR/TR. Normal stress ECG results. Had a cardiac catheterization dene 04/07/17 which showed normal left ventricular function of 60%, patent proximal LAD stent, diagonal 1 and circumflex with moderate disease and RCA relatively free of disease.   Admitted 07/28/17 due to chest pain. Troponins were negative. Cardiology consult was obtained. Imdur increased and he was discharged the following the day. Admitted 04/05/17 with chest pain and heart failure. Initially given IV diuretics. Cardiology consult obtained and catheterization done. Medication management was recommended and he was discharged home after 2 days.   He presents today for his follow-up visit with a chief complaint of mild shortness of breath upon moderate exertion. This has been present for several years with varying levels of severity. He does feel like his breathing is improving. He has associated fatigue & improving depression. He denies any chest pain, edema, palpitations, abdominal distention, dizziness or weight gain.  Past Medical History:  Diagnosis Date  . CHF (congestive heart failure) ([place])   . Coronary artery disease   . Diabetes mellitus without complication ([place])   . Hyperlipidemia   . Hypertension    Past Surgical History:  Procedure Laterality Date  . CORONARY ANGIOPLASTY WITH STENT PLACEMENT    . LEFT HEART CATH AND CORONARY ANGIOGRAPHY N/A 04/07/2017   Procedure: Left Heart Cath and Coronary Angiography and possible PCI;  Surgeon: Yolonda Kida, MD;  Location: [facility];  Service: Cardiovascular;  Laterality: N/A;   Family History  Problem Relation Age of Onset  . CAD Father   . Heart  failure Brother    Social History   Tobacco Use  . Smoking status: Never Smoker  . Smokeless tobacco: Never Used  Substance Use Topics  . Alcohol use: No   Allergies  Allergen Reactions  . Sulfa Antibiotics Rash   Prior to Admission medications   Medication Sig Start Date End Date Taking? Authorizing Provider  aspirin 325 MG tablet Take 325 mg by mouth daily.   Yes [provider]  furosemide (LASIX) 20 MG tablet Take 1 tablet (20 mg total) by mouth 2 (two) times daily. 04/07/17 04/07/18 Yes Dustin Flock, MD  Insulin Human (INSULIN PUMP) SOLN Inject into the skin.   Yes [provider]  isosorbide mononitrate (IMDUR) 30 MG 24 hr tablet Take 2 tablets (60 mg total) daily by mouth. 07/29/17  Yes Fritzi Mandes, MD  meloxicam (MOBIC) 7.5 MG tablet Take 7.5 mg by mouth daily.   Yes [provider]  nebivolol (BYSTOLIC) 10 MG tablet Take 10 mg by mouth daily.   Yes [provider]  prasugrel (EFFIENT) 10 MG TABS tablet TAKE ONE TABLET BY MOUTH EVERY DAY 01/04/17  Yes [provider]  rosuvastatin (CRESTOR) 10 MG tablet Take 1 tablet (10 mg total) by mouth daily. 04/08/17  Yes Dustin Flock, MD   Review of Systems  Constitutional: Positive for fatigue. Negative for appetite change.  HENT: Negative for congestion, postnasal drip and sore throat.   Eyes: Negative.   Respiratory: Positive for shortness of breath (improving). Negative for chest tightness.   Cardiovascular: Negative for chest pain, palpitations and leg swelling.  Gastrointestinal: Negative for  abdominal distention and abdominal pain.       Gassy  Endocrine: Negative.   Genitourinary: Negative.   Musculoskeletal: Positive for arthralgias (right shoulder). Negative for back pain.  Skin: Negative.   Allergic/Immunologic: Negative.   Neurological: Negative for dizziness and light-headedness.  Hematological: Negative for adenopathy. Does not bruise/bleed easily.   Psychiatric/Behavioral: Positive for dysphoric mood (better since excercising). Negative for sleep disturbance (wearing CPAP nightly). The patient is not nervous/anxious.    Vitals:   09/28/17 0959  BP: (!) 127/55  Pulse: 62  Resp: 18  SpO2: 98%  Weight: (!) 321 lb 8 oz (145.8 kg)  Height: 5\' 10"  (1.778 m)   Wt Readings from Last 3 Encounters:  09/28/17 (!) 321 lb 8 oz (145.8 kg)  09/15/17 (!) 321 lb (145.6 kg)  07/28/17 (!) 312 lb 11.2 oz (141.8 kg)   Lab Results  Component Value Date   CREATININE 0.71 07/29/2017   CREATININE 0.90 07/28/2017   CREATININE 0.91 04/07/2017    Physical Exam  Constitutional: He is oriented to person, place, and time. He appears well-developed and well-nourished.  HENT:  Head: Normocephalic and atraumatic.  Neck: Normal range of motion. Neck supple. No JVD present.  Cardiovascular: Normal rate and regular rhythm.  Pulmonary/Chest: Effort normal. He has no wheezes. He has no rales.  Abdominal: Soft. He exhibits no distension. There is no tenderness.  Musculoskeletal: He exhibits no edema or tenderness.  Neurological: He is alert and oriented to person, place, and time.  Skin: Skin is warm and dry.  Psychiatric: He has a normal mood and affect. His behavior is normal. Thought content normal.  Nursing note and vitals reviewed.  Assessment & Plan:  1: Chronic heart failure with preserved ejection fraction- - NYHA class II - euvolemic today - continues to weigh daily and says that his weight hasn't moved.   Instructed to call for an overnight weight gain of >2 pounds or a weekly weight gain of >5 pounds - not adding salt to his food and has been reading food labels. Continues to eat more at home although did eat foods he normally doesn't eat over the holidays - has been wearing his TED hose daily with removal at bedtime and notes resolution of edema since wearing them - does elevate his legs when he gets home from work - saw cardiologist  ([place]) 07/16/17 - does not meet ReDS vest criteria due to BMI - has finished [facility] and is now going to [employer] 3-5 nights/week and doing ~1 hour of treadmill and machines - Pharm D reviewed medications with the patient  2: DM- - drinking glucerna - glucose this morning was 57 fasting - saw endocrinologist ([facility]) 08/19/17 - A1c on 08/06/17 was 7.6%  3: HTN- - BP looks good today - saw PCP ([place]) 07/06/17 - BMP on 08/06/17 reviewed and showed sodium 141, potassium 4.2 and GFR 88  4: Obstructive sleep apnea- - wearing CPAP nightly - sleeps in a recliner due to pain in his right shoulder  Patient did not bring his medications nor a list. Each medication was verbally reviewed with the patient and he was encouraged to bring the bottles to every visit to confirm accuracy of list.  Offered to transition patient completely to cardiologist but patient says he'd like to come another time to hold himself accountable regarding his exercise regimen and diet. Will have him return in 3 months or sooner for any questions/problems before then.

## 2017-09-28 ENCOUNTER — Ambulatory Visit: Payer: BC Managed Care – PPO | Attending: Family | Admitting: Family

## 2017-09-28 ENCOUNTER — Encounter: Payer: Self-pay | Admitting: Family

## 2017-09-28 VITALS — BP 127/55 | HR 62 | Resp 18 | Ht 70.0 in | Wt 321.5 lb

## 2017-09-28 DIAGNOSIS — M25511 Pain in right shoulder: Secondary | ICD-10-CM | POA: Diagnosis not present

## 2017-09-28 DIAGNOSIS — I110 Hypertensive heart disease with heart failure: Secondary | ICD-10-CM | POA: Insufficient documentation

## 2017-09-28 DIAGNOSIS — Z955 Presence of coronary angioplasty implant and graft: Secondary | ICD-10-CM | POA: Diagnosis not present

## 2017-09-28 DIAGNOSIS — Z8249 Family history of ischemic heart disease and other diseases of the circulatory system: Secondary | ICD-10-CM | POA: Insufficient documentation

## 2017-09-28 DIAGNOSIS — E785 Hyperlipidemia, unspecified: Secondary | ICD-10-CM | POA: Insufficient documentation

## 2017-09-28 DIAGNOSIS — G4733 Obstructive sleep apnea (adult) (pediatric): Secondary | ICD-10-CM | POA: Insufficient documentation

## 2017-09-28 DIAGNOSIS — E109 Type 1 diabetes mellitus without complications: Secondary | ICD-10-CM

## 2017-09-28 DIAGNOSIS — Z79899 Other long term (current) drug therapy: Secondary | ICD-10-CM | POA: Insufficient documentation

## 2017-09-28 DIAGNOSIS — E119 Type 2 diabetes mellitus without complications: Secondary | ICD-10-CM | POA: Diagnosis not present

## 2017-09-28 DIAGNOSIS — Z7982 Long term (current) use of aspirin: Secondary | ICD-10-CM | POA: Insufficient documentation

## 2017-09-28 DIAGNOSIS — Z794 Long term (current) use of insulin: Secondary | ICD-10-CM | POA: Diagnosis not present

## 2017-09-28 DIAGNOSIS — I5032 Chronic diastolic (congestive) heart failure: Secondary | ICD-10-CM | POA: Insufficient documentation

## 2017-09-28 DIAGNOSIS — I2510 Atherosclerotic heart disease of native coronary artery without angina pectoris: Secondary | ICD-10-CM | POA: Diagnosis present

## 2017-09-28 DIAGNOSIS — I10 Essential (primary) hypertension: Secondary | ICD-10-CM

## 2017-09-28 NOTE — Patient Instructions (Signed)
Continue weighing daily and call for an overnight weight gain of > 2 pounds or a weekly weight gain of >5 pounds. 

## 2017-12-27 NOTE — Progress Notes (Signed)
Patient ID: Philip Cook, male    DOB: 07-11-1961, 57 y.o.   MRN: [id number]  HPI  Philip Cook is a 57 y/o male with a history of HTN, hyperlipidemia, diabetes, CAD, obstructive sleep apnea and chronic heart failure.   Reviewed echo/stress test report from 12/11/16 which showed an EF of 55% along with trivial Philip/TR. Normal stress ECG results. Had a cardiac catheterization dene 04/07/17 which showed normal left ventricular function of 60%, patent proximal LAD stent, diagonal 1 and circumflex with moderate disease and RCA relatively free of disease.   Admitted 07/28/17 due to chest pain. Troponins were negative. Cardiology consult was obtained. Imdur increased and he was discharged the following the day.   He presents today for his follow-up visit with a chief complaint of minimal shortness of breath upon moderate exertion. He says this has been chronic in nature having been present for several years. He has associated fatigue, depression and fluctuating weight along with this. He denies any abdominal distention, edema (when wearing TED hose), palpitations, chest pain, dizziness or difficulty sleeping.   Past Medical History:  Diagnosis Date  . CHF (congestive heart failure) ([place])   . Coronary artery disease   . Diabetes mellitus without complication ([place])   . Hyperlipidemia   . Hypertension    Past Surgical History:  Procedure Laterality Date  . CORONARY ANGIOPLASTY WITH STENT PLACEMENT    . LEFT HEART CATH AND CORONARY ANGIOGRAPHY N/A 04/07/2017   Procedure: Left Heart Cath and Coronary Angiography and possible PCI;  Surgeon: Yolonda Kida, MD;  Location: [place] [facility];  Service: Cardiovascular;  Laterality: N/A;   Family History  Problem Relation Age of Onset  . CAD Father   . Heart failure Brother    Social History   Tobacco Use  . Smoking status: Never Smoker  . Smokeless tobacco: Never Used  Substance Use Topics  . Alcohol use: No   Allergies  Allergen Reactions   . Sulfa Antibiotics Rash   Prior to Admission medications   Medication Sig Start Date End Date Taking? Authorizing Provider  aspirin 325 MG tablet Take 325 mg by mouth daily.    Yes [provider]  furosemide (LASIX) 20 MG tablet Take 1 tablet (20 mg total) by mouth 2 (two) times daily. Patient taking differently: Take 20 mg by mouth 2 (two) times daily.  04/07/17 04/07/18 Yes Dustin Flock, MD  Insulin Human (INSULIN PUMP) SOLN Inject into the skin.    Yes [provider]  isosorbide mononitrate (IMDUR) 30 MG 24 hr tablet Take 2 tablets (60 mg total) daily by mouth. 07/29/17  Yes Fritzi Mandes, MD  meloxicam (MOBIC) 7.5 MG tablet Take 7.5 mg by mouth daily.    Yes [provider]  Multiple Vitamin (MULTIVITAMIN WITH MINERALS) TABS tablet Take 1 tablet by mouth daily.   Yes [provider]  nebivolol (BYSTOLIC) 10 MG tablet Take 10 mg by mouth daily.   Yes [provider]  prasugrel (EFFIENT) 10 MG TABS tablet TAKE ONE TABLET BY MOUTH EVERY DAY 01/04/17  Yes [provider]  rosuvastatin (CRESTOR) 10 MG tablet Take 1 tablet (10 mg total) by mouth daily. 04/08/17  Yes Dustin Flock, MD    Review of Systems  Constitutional: Positive for fatigue. Negative for appetite change.  HENT: Negative for congestion, postnasal drip and sore throat.   Eyes: Negative.   Respiratory: Positive for shortness of breath (when climbing stairs). Negative for chest tightness.   Cardiovascular:  Negative for chest pain, palpitations and leg swelling.  Gastrointestinal: Negative for abdominal distention and abdominal pain.  Endocrine: Negative.   Genitourinary: Negative.   Musculoskeletal: Positive for arthralgias (right shoulder). Negative for back pain.  Skin: Negative.   Allergic/Immunologic: Negative.   Neurological: Negative for dizziness and light-headedness.  Hematological: Negative for adenopathy. Does not bruise/bleed easily.  Psychiatric/Behavioral:  Positive for dysphoric mood (better since excercising). Negative for sleep disturbance (wearing CPAP nightly). The patient is not nervous/anxious.    Vitals:   12/28/17 1037  BP: 129/65  Pulse: 65  Resp: 18  SpO2: 100%  Weight: (!) 322 lb 2 oz (146.1 kg)  Height: 5\' 10"  (1.778 m)   Wt Readings from Last 3 Encounters:  12/28/17 (!) 322 lb 2 oz (146.1 kg)  09/28/17 (!) 321 lb 8 oz (145.8 kg)  09/15/17 (!) 321 lb (145.6 kg)   Lab Results  Component Value Date   CREATININE 0.71 07/29/2017   CREATININE 0.90 07/28/2017   CREATININE 0.91 04/07/2017   Physical Exam  Constitutional: He is oriented to person, place, and time. He appears well-developed and well-nourished.  HENT:  Head: Normocephalic and atraumatic.  Neck: Normal range of motion. Neck supple. No JVD present.  Cardiovascular: Normal rate and regular rhythm.  Pulmonary/Chest: Effort normal. He has no wheezes. He has no rales.  Abdominal: Soft. He exhibits no distension. There is no tenderness.  Musculoskeletal: He exhibits edema (1+ pitting edema in bilateral lower legs). He exhibits no tenderness.  Neurological: He is alert and oriented to person, place, and time.  Skin: Skin is warm and dry.  Psychiatric: He has a normal mood and affect. His behavior is normal. Thought content normal.  Nursing note and vitals reviewed.  Assessment & Plan:  1: Chronic heart failure with preserved ejection fraction- - NYHA class II - euvolemic today - continues to weigh daily and says that his weight fluctuates. Reminded to call for an overnight weight gain of >2 pounds or a weekly weight gain of >5 pounds - weight unchanged from last time he was here - not adding salt to his food and has been reading food labels. - has been wearing his TED hose daily but sometimes doesn't get them on until after he gets home. 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 on until he gets home - does elevate his legs when he gets home  from work - saw cardiologist (Vian) 11/02/17 - does not meet ReDS vest criteria due to BMI - walking on treadmill twice weekly for ~ 20-30 minutes each time - Pharm D reviewed medications with the patient  2: Diabetes- - drinking glucerna - fasting glucose at home this morning was 98  - saw endocrinologist (Solum) 11/30/17 - A1c on 11/19/17 was 7.8%  3: HTN- - BP looks good today - saw PCP ([place]) 10/12/17 - BMP on 11/19/17 [facility]) reviewed and showed sodium 139, potassium 4.2 and GFR 78  4: Obstructive sleep apnea- - wearing CPAP nightly - sleeps in a recliner due to pain in his right shoulder  Patient did not bring his medications nor a list. Each medication was verbally reviewed with the patient and he was encouraged to bring the bottles to every visit to confirm accuracy of list.  Will not make a return appointment at this time. Advised patient that he could call back at anytime to make another appointment.

## 2017-12-28 ENCOUNTER — Ambulatory Visit: Payer: BC Managed Care – PPO | Attending: Family | Admitting: Family

## 2017-12-28 ENCOUNTER — Encounter: Payer: Self-pay | Admitting: Family

## 2017-12-28 VITALS — BP 129/65 | HR 65 | Resp 18 | Ht 70.0 in | Wt 322.1 lb

## 2017-12-28 DIAGNOSIS — I2510 Atherosclerotic heart disease of native coronary artery without angina pectoris: Secondary | ICD-10-CM | POA: Insufficient documentation

## 2017-12-28 DIAGNOSIS — Z79899 Other long term (current) drug therapy: Secondary | ICD-10-CM | POA: Diagnosis not present

## 2017-12-28 DIAGNOSIS — R5383 Other fatigue: Secondary | ICD-10-CM | POA: Diagnosis not present

## 2017-12-28 DIAGNOSIS — E119 Type 2 diabetes mellitus without complications: Secondary | ICD-10-CM | POA: Insufficient documentation

## 2017-12-28 DIAGNOSIS — Z9889 Other specified postprocedural states: Secondary | ICD-10-CM | POA: Insufficient documentation

## 2017-12-28 DIAGNOSIS — Z7982 Long term (current) use of aspirin: Secondary | ICD-10-CM | POA: Diagnosis not present

## 2017-12-28 DIAGNOSIS — Z955 Presence of coronary angioplasty implant and graft: Secondary | ICD-10-CM | POA: Insufficient documentation

## 2017-12-28 DIAGNOSIS — G4733 Obstructive sleep apnea (adult) (pediatric): Secondary | ICD-10-CM | POA: Diagnosis not present

## 2017-12-28 DIAGNOSIS — Z791 Long term (current) use of non-steroidal anti-inflammatories (NSAID): Secondary | ICD-10-CM | POA: Diagnosis not present

## 2017-12-28 DIAGNOSIS — F329 Major depressive disorder, single episode, unspecified: Secondary | ICD-10-CM | POA: Diagnosis not present

## 2017-12-28 DIAGNOSIS — I5032 Chronic diastolic (congestive) heart failure: Secondary | ICD-10-CM | POA: Diagnosis not present

## 2017-12-28 DIAGNOSIS — Z882 Allergy status to sulfonamides status: Secondary | ICD-10-CM | POA: Diagnosis not present

## 2017-12-28 DIAGNOSIS — E785 Hyperlipidemia, unspecified: Secondary | ICD-10-CM | POA: Diagnosis not present

## 2017-12-28 DIAGNOSIS — Z794 Long term (current) use of insulin: Secondary | ICD-10-CM | POA: Insufficient documentation

## 2017-12-28 DIAGNOSIS — I10 Essential (primary) hypertension: Secondary | ICD-10-CM

## 2017-12-28 DIAGNOSIS — R0602 Shortness of breath: Secondary | ICD-10-CM | POA: Insufficient documentation

## 2017-12-28 DIAGNOSIS — I110 Hypertensive heart disease with heart failure: Secondary | ICD-10-CM | POA: Diagnosis present

## 2017-12-28 DIAGNOSIS — Z8249 Family history of ischemic heart disease and other diseases of the circulatory system: Secondary | ICD-10-CM | POA: Diagnosis not present

## 2017-12-28 DIAGNOSIS — M25511 Pain in right shoulder: Secondary | ICD-10-CM | POA: Diagnosis not present

## 2017-12-28 DIAGNOSIS — E109 Type 1 diabetes mellitus without complications: Secondary | ICD-10-CM

## 2017-12-28 NOTE — Patient Instructions (Signed)
Continue weighing daily and call for an overnight weight gain of > 2 pounds or a weekly weight gain of >5 pounds. 

## 2019-05-10 IMAGING — CR DG CHEST 2V
1 series · 2 of 2 positions shown · non-contrast
Comparison: April 05, 2017 and November 24, 2012

CLINICAL DATA: Shortness of breath with chest pain

EXAM:
CHEST  2 VIEW

[Series 1: dg chest 2 view · 0.14mm/px · 2 of 2 slices shown]
[im 1/2]
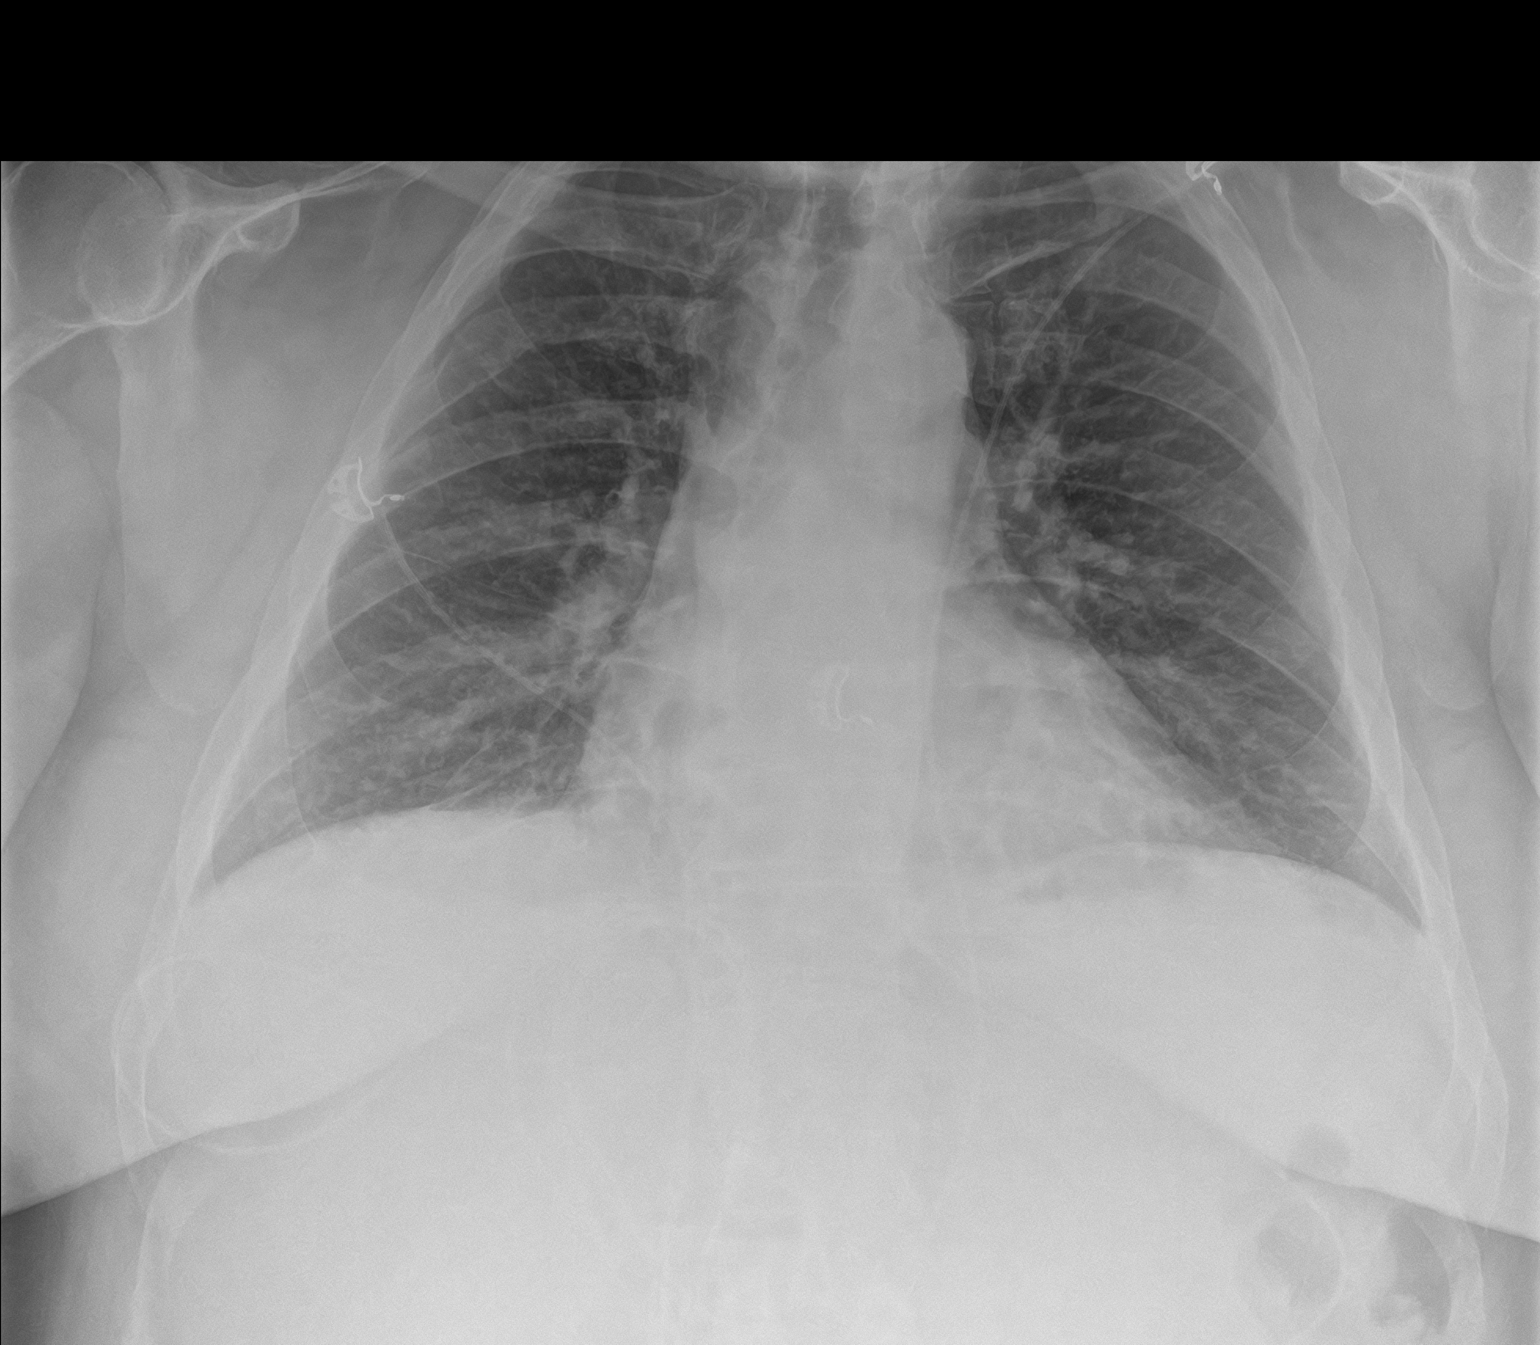
[im 2/2]
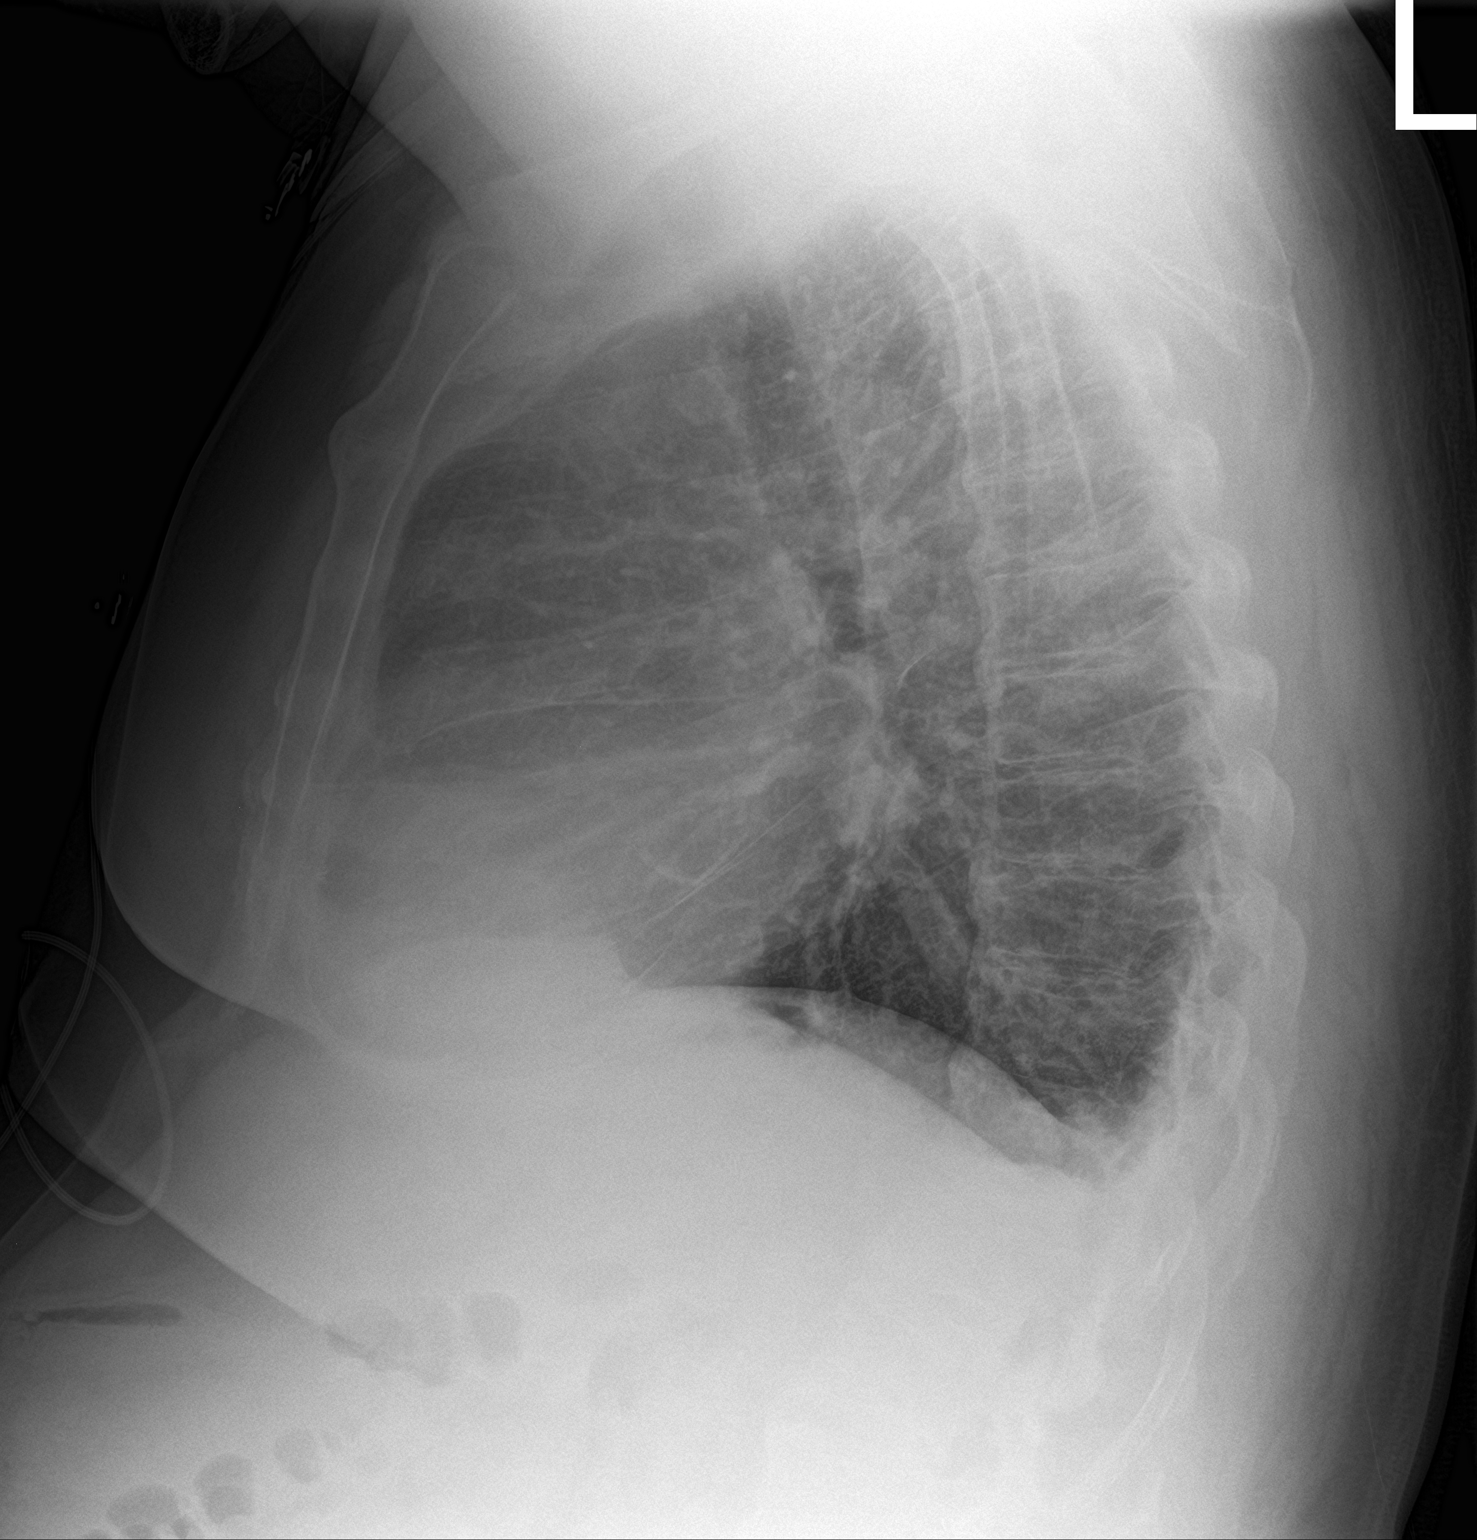

[2 of 2 positions shown; findings below may reference images not displayed]

FINDINGS: There is persistent interstitial edema without airspace
consolidation or volume loss. There is an equivocal right pleural
effusion. Heart is borderline enlarged with slight pulmonary venous
hypertension. No adenopathy. There is diffuse idiopathic skeletal
hyperostosis in the thoracic spine.
IMPRESSION: Evidence of a degree of congestive heart failure. No airspace
consolidation. Stable cardiac silhouette.

## 2019-12-13 ENCOUNTER — Other Ambulatory Visit: Payer: Self-pay | Admitting: Family Medicine

## 2019-12-13 ENCOUNTER — Other Ambulatory Visit: Payer: Self-pay

## 2019-12-13 ENCOUNTER — Ambulatory Visit
Admission: RE | Admit: 2019-12-13 | Discharge: 2019-12-13 | Disposition: A | Discharge status: Home / Self Care | Payer: BC Managed Care – PPO | Source: Ambulatory Visit | Attending: Family Medicine | Admitting: Family Medicine

## 2019-12-13 DIAGNOSIS — R1033 Periumbilical pain: Secondary | ICD-10-CM

## 2019-12-13 DIAGNOSIS — K429 Umbilical hernia without obstruction or gangrene: Secondary | ICD-10-CM | POA: Insufficient documentation

## 2019-12-13 DIAGNOSIS — R1031 Right lower quadrant pain: Secondary | ICD-10-CM

## 2019-12-13 MED ORDER — IOHEXOL 300 MG/ML  SOLN
125.0000 mL | Freq: Once | INTRAMUSCULAR | Status: AC | PRN
  Administered 2019-12-13: 125 mL via INTRAVENOUS

## 2019-12-19 ENCOUNTER — Other Ambulatory Visit: Payer: Self-pay | Admitting: Gastroenterology

## 2019-12-19 ENCOUNTER — Ambulatory Visit
Admission: RE | Admit: 2019-12-19 | Discharge: 2019-12-19 | Disposition: A | Discharge status: Home / Self Care | Payer: BC Managed Care – PPO | Source: Ambulatory Visit | Attending: Gastroenterology | Admitting: Gastroenterology

## 2019-12-19 DIAGNOSIS — R933 Abnormal findings on diagnostic imaging of other parts of digestive tract: Secondary | ICD-10-CM

## 2020-02-06 ENCOUNTER — Other Ambulatory Visit
Admission: RE | Admit: 2020-02-06 | Discharge: 2020-02-06 | Disposition: A | Discharge status: Home / Self Care | Payer: BC Managed Care – PPO | Source: Ambulatory Visit | Attending: Internal Medicine | Admitting: Internal Medicine

## 2020-02-06 DIAGNOSIS — Z20822 Contact with and (suspected) exposure to covid-19: Secondary | ICD-10-CM | POA: Diagnosis not present

## 2020-02-06 DIAGNOSIS — Z01812 Encounter for preprocedural laboratory examination: Secondary | ICD-10-CM | POA: Diagnosis present

## 2020-02-06 LAB — SARS CORONAVIRUS 2 (TAT 6-24 HRS): SARS Coronavirus 2: NEGATIVE

## 2020-02-08 ENCOUNTER — Ambulatory Visit: Payer: BC Managed Care – PPO | Admitting: Anesthesiology

## 2020-02-08 ENCOUNTER — Ambulatory Visit
Admission: RE | Admit: 2020-02-08 | Discharge: 2020-02-08 | Disposition: A | Discharge status: Home / Self Care | Payer: BC Managed Care – PPO | Attending: Internal Medicine | Admitting: Internal Medicine

## 2020-02-08 ENCOUNTER — Other Ambulatory Visit: Payer: Self-pay

## 2020-02-08 ENCOUNTER — Encounter
Admission: RE | Disposition: A | Discharge status: Home / Self Care | Payer: Self-pay | Source: Home / Self Care | Attending: Internal Medicine

## 2020-02-08 DIAGNOSIS — R933 Abnormal findings on diagnostic imaging of other parts of digestive tract: Secondary | ICD-10-CM | POA: Insufficient documentation

## 2020-02-08 DIAGNOSIS — K640 First degree hemorrhoids: Secondary | ICD-10-CM | POA: Diagnosis not present

## 2020-02-08 DIAGNOSIS — Z79899 Other long term (current) drug therapy: Secondary | ICD-10-CM | POA: Insufficient documentation

## 2020-02-08 DIAGNOSIS — I2510 Atherosclerotic heart disease of native coronary artery without angina pectoris: Secondary | ICD-10-CM | POA: Diagnosis not present

## 2020-02-08 DIAGNOSIS — K5730 Diverticulosis of large intestine without perforation or abscess without bleeding: Secondary | ICD-10-CM | POA: Diagnosis not present

## 2020-02-08 DIAGNOSIS — I110 Hypertensive heart disease with heart failure: Secondary | ICD-10-CM | POA: Diagnosis not present

## 2020-02-08 DIAGNOSIS — D123 Benign neoplasm of transverse colon: Secondary | ICD-10-CM | POA: Insufficient documentation

## 2020-02-08 DIAGNOSIS — Z882 Allergy status to sulfonamides status: Secondary | ICD-10-CM | POA: Insufficient documentation

## 2020-02-08 DIAGNOSIS — Z888 Allergy status to other drugs, medicaments and biological substances status: Secondary | ICD-10-CM | POA: Insufficient documentation

## 2020-02-08 DIAGNOSIS — E119 Type 2 diabetes mellitus without complications: Secondary | ICD-10-CM | POA: Diagnosis not present

## 2020-02-08 DIAGNOSIS — Z791 Long term (current) use of non-steroidal anti-inflammatories (NSAID): Secondary | ICD-10-CM | POA: Insufficient documentation

## 2020-02-08 DIAGNOSIS — Z7982 Long term (current) use of aspirin: Secondary | ICD-10-CM | POA: Diagnosis not present

## 2020-02-08 DIAGNOSIS — I509 Heart failure, unspecified: Secondary | ICD-10-CM | POA: Insufficient documentation

## 2020-02-08 DIAGNOSIS — E785 Hyperlipidemia, unspecified: Secondary | ICD-10-CM | POA: Insufficient documentation

## 2020-02-08 HISTORY — PX: COLONOSCOPY WITH PROPOFOL: SHX5780

## 2020-02-08 LAB — GLUCOSE, CAPILLARY: Glucose-Capillary: 176 mg/dL — ABNORMAL HIGH (ref 70–99)

## 2020-02-08 SURGERY — COLONOSCOPY WITH PROPOFOL
Anesthesia: General

## 2020-02-08 MED ORDER — SODIUM CHLORIDE 0.9 % IV SOLN: INTRAVENOUS | Status: DC

## 2020-02-08 MED ORDER — PROPOFOL 500 MG/50ML IV EMUL
INTRAVENOUS | Status: DC | PRN
  Administered 2020-02-08: 120 ug/kg/min via INTRAVENOUS

## 2020-02-08 NOTE — Anesthesia Procedure Notes (Signed)
Performed by: Cook-Martin, Narmeen Kerper Pre-anesthesia Checklist: Patient identified, Emergency Drugs available, Suction available, Patient being monitored and Timeout performed Patient Re-evaluated:Patient Re-evaluated prior to induction Oxygen Delivery Method: Supernova nasal CPAP Preoxygenation: Pre-oxygenation with 100% oxygen Induction Type: IV induction Placement Confirmation: CO2 detector and positive ETCO2       

## 2020-02-08 NOTE — Interval H&P Note (Signed)
History and Physical Interval Note:  02/08/2020 12:42 PM  Philip Cook  has presented today for surgery, with the diagnosis of abnormal ct.  The various methods of treatment have been discussed with the patient and family. After consideration of risks, benefits and other options for treatment, the patient has consented to  Procedure(s): COLONOSCOPY WITH PROPOFOL (N/A) as a surgical intervention.  The patient's history has been reviewed, patient examined, no change in status, stable for surgery.  I have reviewed the patient's chart and labs.  Questions were answered to the patient's satisfaction.     [place], [place]

## 2020-02-08 NOTE — Anesthesia Postprocedure Evaluation (Signed)
Anesthesia Post Note  Patient: Philip Cook  Procedure(s) Performed: COLONOSCOPY WITH PROPOFOL (N/A )  Patient location during evaluation: Endoscopy Anesthesia Type: General Level of consciousness: awake and alert Pain management: pain level controlled Vital Signs Assessment: post-procedure vital signs reviewed and stable Respiratory status: spontaneous breathing, nonlabored ventilation and respiratory function stable Cardiovascular status: blood pressure returned to baseline and stable Postop Assessment: no apparent nausea or vomiting Anesthetic complications: no     Last Vitals:  Vitals:   02/08/20 1332 02/08/20 1342  BP: 105/61 123/79  Pulse: 68 61  Resp: 14 (!) 25  Temp:    SpO2: 98% 98%    Last Pain:  Vitals:   02/08/20 1342  TempSrc:   PainSc: 0-No pain                 Alphonsus Sias

## 2020-02-08 NOTE — Transfer of Care (Signed)
Immediate Anesthesia Transfer of Care Note  Patient: Philip Cook  Procedure(s) Performed: COLONOSCOPY WITH PROPOFOL (N/A )  Patient Location: PACU  Anesthesia Type:General  Level of Consciousness: awake and sedated  Airway & Oxygen Therapy: Patient Spontanous Breathing and Patient connected to face mask oxygen  Post-op Assessment: Report given to RN and Post -op Vital signs reviewed and stable  Post vital signs: Reviewed and stable  Last Vitals:  Vitals Value Taken Time  BP    Temp    Pulse    Resp    SpO2      Last Pain:  Vitals:   02/08/20 1229  TempSrc: Temporal         Complications: No apparent anesthesia complications

## 2020-02-08 NOTE — Op Note (Signed)
[facility] Patient Name: Philip Cook Procedure Date: 02/08/2020 12:02 PM MRN: [id number] Account #: [id number] Date of Birth: 03-02-1961 Admit Type: Outpatient Age: 59 Room: [facility] ENDO ROOM 3 Gender: Male Note Status: Finalized Procedure:             Colonoscopy Indications:           Abnormal CT of the GI tract Providers:             Benay Pike. Alice Reichert MD, MD Referring MD:          Leonie Douglas. Doy Hutching, MD (Referring MD) Medicines:             Propofol per Anesthesia Complications:         No immediate complications. Procedure:             Pre-Anesthesia Assessment:                        - The risks and benefits of the procedure and the                         sedation options and risks were discussed with the                         patient. All questions were answered and informed                         consent was obtained.                        - Patient identification and proposed procedure were                         verified prior to the procedure by the nurse. The                         procedure was verified in the procedure room.                        - ASA Grade Assessment: III - A patient with severe                         systemic disease.                        - After reviewing the risks and benefits, the patient                         was deemed in satisfactory condition to undergo the                         procedure.                        After obtaining informed consent, the colonoscope was                         passed under direct vision. Throughout the procedure,                         the patient's blood  pressure, pulse, and oxygen                         saturations were monitored continuously. The                         Colonoscope was introduced through the anus and                         advanced to the the cecum, identified by appendiceal                         orifice and ileocecal valve. The  colonoscopy was                         somewhat difficult due to significant looping.                         Successful completion of the procedure was aided by                         applying abdominal pressure. The patient tolerated the                         procedure well. The quality of the bowel preparation                         was adequate. The ileocecal valve, appendiceal                         orifice, and rectum were photographed. Findings:      The perianal and digital rectal examinations were normal. Pertinent       negatives include normal sphincter tone and no palpable rectal lesions.      Non-bleeding internal hemorrhoids were found during perianal exam. The       hemorrhoids were Grade I (internal hemorrhoids that do not prolapse).      Many small and large-mouthed diverticula were found in the left colon.      A 4 mm polyp was found in the transverse colon. The polyp was sessile.       The polyp was removed with a cold biopsy forceps. Resection and       retrieval were complete.      The exam was otherwise without abnormality. Impression:            - Non-bleeding internal hemorrhoids.                        - Diverticulosis in the left colon.                        - One 4 mm polyp in the transverse colon, removed with                         a cold biopsy forceps. Resected and retrieved.                        - The examination was otherwise normal. Recommendation:        - Patient has a contact number available for  emergencies. The signs and symptoms of potential                         delayed complications were discussed with the patient.                         Return to normal activities tomorrow. Written                         discharge instructions were provided to the patient.                        - Resume previous diet.                        - Continue present medications.                        - Repeat colonoscopy is  recommended for surveillance.                         The colonoscopy date will be determined after                         pathology results from today's exam become available                         for review.                        - Return to GI office PRN.                        - The findings and recommendations were discussed with                         the patient. Procedure Code(s):     --- Professional ---                        [phone], Colonoscopy, flexible; with biopsy, single or                         multiple Diagnosis Code(s):     --- Professional ---                        R93.3, Abnormal findings on diagnostic imaging of                         other parts of digestive tract                        K57.30, Diverticulosis of large intestine without                         perforation or abscess without bleeding                        K63.5, Polyp of colon                        K64.0, First  degree hemorrhoids CPT copyright 2019 American Medical Association. All rights reserved. The codes documented in this report are preliminary and upon coder review may  be revised to meet current compliance requirements. Efrain Sella MD, MD 02/08/2020 1:20:49 PM This report has been signed electronically. Number of Addenda: 0 Note Initiated On: 02/08/2020 12:02 PM Scope Withdrawal Time: 0 hours 8 minutes 4 seconds  Total Procedure Duration: 0 hours 11 minutes 58 seconds  Estimated Blood Loss:  Estimated blood loss: none.      [facility]

## 2020-02-08 NOTE — Anesthesia Preprocedure Evaluation (Addendum)
Anesthesia Evaluation  Patient identified by MRN, date of birth, ID band Patient awake    Reviewed: Allergy & Precautions, H&P , NPO status , reviewed documented beta blocker date and time   Airway Mallampati: III  TM Distance: >3 FB Neck ROM: full    Dental  (+) Teeth Intact   Pulmonary sleep apnea and Continuous Positive Airway Pressure Ventilation ,    Pulmonary exam normal        Cardiovascular hypertension, + CAD, + Cardiac Stents and +CHF  Normal cardiovascular exam  2018 Cath Conclusion Diagnostic cardiac catheter from right groin Normal overall left ventricular function of 60% Stent proximal LAD rather patient Diagonal 1 with moderate to severe disease Circumflex moderate disease RCA relatively free of disease and branch Recommend medical therapy   Neuro/Psych    GI/Hepatic neg GERD  ,  Endo/Other  diabetes  Renal/GU      Musculoskeletal   Abdominal   Peds  Hematology   Anesthesia Other Findings Past Medical History: No date: CHF (congestive heart failure) ([facility]) No date: Coronary artery disease No date: Diabetes mellitus without complication ([facility]) No date: Hyperlipidemia No date: Hypertension  Past Surgical History: No date: CORONARY ANGIOPLASTY WITH STENT PLACEMENT 04/07/2017: LEFT HEART CATH AND CORONARY ANGIOGRAPHY; N/A     Comment:  Procedure: Left Heart Cath and Coronary Angiography and               possible PCI;  Surgeon: Yolonda Kida, MD;                Location: [facility];  Service: Cardiovascular;              Laterality: N/A;   Education re OSA and TIVA done  Reproductive/Obstetrics                            Anesthesia Physical Anesthesia Plan  ASA: III  Anesthesia Plan: General   Post-op Pain Management:    Induction: Intravenous  PONV Risk Score and Plan: Treatment may vary due to age or medical condition and TIVA  Airway  Management Planned: Nasal Cannula and Natural Airway  Additional Equipment:   Intra-op Plan:   Post-operative Plan:   Informed Consent: I have reviewed the patients History and Physical, chart, labs and discussed the procedure including the risks, benefits and alternatives for the proposed anesthesia with the patient or authorized representative who has indicated his/her understanding and acceptance.     Dental Advisory Given  Plan Discussed with: CRNA  Anesthesia Plan Comments:         Anesthesia Quick Evaluation

## 2020-02-08 NOTE — H&P (Signed)
Outpatient short stay form Pre-procedure 02/08/2020 12:40 PM Malaysha Arlen K. Alice Reichert, M.D.  Primary Physician: Fulton Reek, M.D.  Reason for visit:  Abnormal CT of the GI tract  History of present illness:  59 y/o male with history of colonic diverticulosis on his last colonoscopy in 2014 was recently hospitalized 7-8 weeks ago for presumed microperforation of the cecum/TI area which clinically improved and then resolved with liquid diet and antibiotics. Patient currently denies abdominal pain, fever, rectal bleeding. Colonoscopy has been arranged to investigate the CT abnormality and to assure no malignancy.    Current Facility-Administered Medications:  .  0.9 %  sodium chloride infusion, , Intravenous, Continuous, Ariauna Farabee, Benay Pike, MD  Medications Prior to Admission  Medication Sig Dispense Refill Last Dose  . aspirin 325 MG tablet Take 325 mg by mouth daily.    02/07/2020 at Unknown time  . furosemide (LASIX) 20 MG tablet Take 1 tablet (20 mg total) by mouth 2 (two) times daily. (Patient taking differently: Take 20 mg by mouth 2 (two) times daily. ) 60 tablet 11 02/07/2020 at Unknown time  . Insulin Human (INSULIN PUMP) SOLN Inject into the skin.    02/08/2020 at Unknown time  . isosorbide mononitrate (IMDUR) 30 MG 24 hr tablet Take 2 tablets (60 mg total) daily by mouth. 30 tablet 0 02/08/2020 at Unknown time  . nebivolol (BYSTOLIC) 10 MG tablet Take 10 mg by mouth daily.   02/08/2020 at Unknown time  . rosuvastatin (CRESTOR) 10 MG tablet Take 1 tablet (10 mg total) by mouth daily. 30 tablet 0 02/08/2020 at Unknown time  . meloxicam (MOBIC) 7.5 MG tablet Take 7.5 mg by mouth daily.      . Multiple Vitamin (MULTIVITAMIN WITH MINERALS) TABS tablet Take 1 tablet by mouth daily.     . prasugrel (EFFIENT) 10 MG TABS tablet TAKE ONE TABLET BY MOUTH EVERY DAY   02/02/2020     Allergies  Allergen Reactions  . Trulicity [Dulaglutide] Nausea And Vomiting  . Sulfa Antibiotics Rash     Past  Medical History:  Diagnosis Date  . CHF (congestive heart failure) ([place])   . Coronary artery disease   . Diabetes mellitus without complication ([place])   . Hyperlipidemia   . Hypertension     Review of systems:  Otherwise negative.    Physical Exam  Gen: Alert, oriented. Appears stated age.  HEENT: [place]/AT. PERRLA. Lungs: CTA, no wheezes. CV: RR nl S1, S2. Abd: soft, benign, no masses. BS+ Ext: No edema. Pulses 2+    Planned procedures: Proceed with colonoscopy. The patient understands the nature of the planned procedure, indications, risks, alternatives and potential complications including but not limited to bleeding, infection, perforation, damage to internal organs and possible oversedation/side effects from anesthesia. The patient agrees and gives consent to proceed.  Please refer to procedure notes for findings, recommendations and patient disposition/instructions.     Abreanna Drawdy K. Alice Reichert, M.D. Gastroenterology 02/08/2020  12:40 PM

## 2020-02-09 ENCOUNTER — Encounter: Payer: Self-pay | Admitting: *Deleted

## 2020-02-09 LAB — SURGICAL PATHOLOGY

## 2020-08-30 ENCOUNTER — Ambulatory Visit
Admission: EM | Admit: 2020-08-30 | Discharge: 2020-08-30 | Disposition: A | Discharge status: Home / Self Care | Payer: BC Managed Care – PPO | Attending: Emergency Medicine | Admitting: Emergency Medicine

## 2020-08-30 ENCOUNTER — Encounter: Payer: Self-pay | Admitting: Emergency Medicine

## 2020-08-30 ENCOUNTER — Other Ambulatory Visit: Payer: Self-pay

## 2020-08-30 ENCOUNTER — Encounter
Admission: EM | Disposition: A | Discharge status: Home / Self Care | Payer: Self-pay | Source: Home / Self Care | Attending: Emergency Medicine

## 2020-08-30 ENCOUNTER — Emergency Department: Payer: BC Managed Care – PPO

## 2020-08-30 ENCOUNTER — Emergency Department: Payer: BC Managed Care – PPO | Admitting: Anesthesiology

## 2020-08-30 DIAGNOSIS — Z794 Long term (current) use of insulin: Secondary | ICD-10-CM | POA: Insufficient documentation

## 2020-08-30 DIAGNOSIS — N179 Acute kidney failure, unspecified: Secondary | ICD-10-CM | POA: Insufficient documentation

## 2020-08-30 DIAGNOSIS — N35912 Unspecified bulbous urethral stricture, male: Secondary | ICD-10-CM | POA: Insufficient documentation

## 2020-08-30 DIAGNOSIS — I2510 Atherosclerotic heart disease of native coronary artery without angina pectoris: Secondary | ICD-10-CM | POA: Diagnosis not present

## 2020-08-30 DIAGNOSIS — Z20822 Contact with and (suspected) exposure to covid-19: Secondary | ICD-10-CM | POA: Insufficient documentation

## 2020-08-30 DIAGNOSIS — Z888 Allergy status to other drugs, medicaments and biological substances status: Secondary | ICD-10-CM | POA: Diagnosis not present

## 2020-08-30 DIAGNOSIS — I700 Atherosclerosis of aorta: Secondary | ICD-10-CM | POA: Insufficient documentation

## 2020-08-30 DIAGNOSIS — I5032 Chronic diastolic (congestive) heart failure: Secondary | ICD-10-CM | POA: Insufficient documentation

## 2020-08-30 DIAGNOSIS — Z87442 Personal history of urinary calculi: Secondary | ICD-10-CM | POA: Diagnosis not present

## 2020-08-30 DIAGNOSIS — E119 Type 2 diabetes mellitus without complications: Secondary | ICD-10-CM | POA: Diagnosis not present

## 2020-08-30 DIAGNOSIS — Z79899 Other long term (current) drug therapy: Secondary | ICD-10-CM | POA: Diagnosis not present

## 2020-08-30 DIAGNOSIS — N201 Calculus of ureter: Secondary | ICD-10-CM

## 2020-08-30 DIAGNOSIS — Z882 Allergy status to sulfonamides status: Secondary | ICD-10-CM | POA: Insufficient documentation

## 2020-08-30 DIAGNOSIS — N200 Calculus of kidney: Secondary | ICD-10-CM

## 2020-08-30 DIAGNOSIS — Z791 Long term (current) use of non-steroidal anti-inflammatories (NSAID): Secondary | ICD-10-CM | POA: Insufficient documentation

## 2020-08-30 DIAGNOSIS — N132 Hydronephrosis with renal and ureteral calculous obstruction: Secondary | ICD-10-CM | POA: Insufficient documentation

## 2020-08-30 DIAGNOSIS — Z7982 Long term (current) use of aspirin: Secondary | ICD-10-CM | POA: Insufficient documentation

## 2020-08-30 DIAGNOSIS — Z6841 Body Mass Index (BMI) 40.0 and over, adult: Secondary | ICD-10-CM | POA: Diagnosis not present

## 2020-08-30 HISTORY — DX: Calculus of kidney: N20.0

## 2020-08-30 HISTORY — PX: CYSTOSCOPY/URETEROSCOPY/HOLMIUM LASER/STENT PLACEMENT: SHX6546

## 2020-08-30 HISTORY — PX: CYSTOSCOPY W/ RETROGRADES: SHX1426

## 2020-08-30 LAB — CBC WITH DIFFERENTIAL/PLATELET
Abs Immature Granulocytes: 0.04 10*3/uL (ref 0.00–0.07)
Basophils Absolute: 0.1 10*3/uL (ref 0.0–0.1)
Basophils Relative: 1 %
Eosinophils Absolute: 0.4 10*3/uL (ref 0.0–0.5)
Eosinophils Relative: 5 %
HCT: 43.4 % (ref 39.0–52.0)
Hemoglobin: 14.7 g/dL (ref 13.0–17.0)
Immature Granulocytes: 0 %
Lymphocytes Relative: 27 %
Lymphs Abs: 2.6 10*3/uL (ref 0.7–4.0)
MCH: 31.6 pg (ref 26.0–34.0)
MCHC: 33.9 g/dL (ref 30.0–36.0)
MCV: 93.3 fL (ref 80.0–100.0)
Monocytes Absolute: 0.6 10*3/uL (ref 0.1–1.0)
Monocytes Relative: 6 %
Neutro Abs: 5.8 10*3/uL (ref 1.7–7.7)
Neutrophils Relative %: 61 %
Platelets: 169 10*3/uL (ref 150–400)
RBC: 4.65 MIL/uL (ref 4.22–5.81)
RDW: 12.6 % (ref 11.5–15.5)
WBC: 9.6 10*3/uL (ref 4.0–10.5)
nRBC: 0 % (ref 0.0–0.2)

## 2020-08-30 LAB — RESP PANEL BY RT-PCR (FLU A&B, COVID) ARPGX2
Influenza A by PCR: NEGATIVE
Influenza B by PCR: NEGATIVE
SARS Coronavirus 2 by RT PCR: NEGATIVE

## 2020-08-30 LAB — URINALYSIS, COMPLETE (UACMP) WITH MICROSCOPIC
Bacteria, UA: NONE SEEN
Bilirubin Urine: NEGATIVE
Glucose, UA: NEGATIVE mg/dL
Ketones, ur: NEGATIVE mg/dL
Nitrite: NEGATIVE
Protein, ur: NEGATIVE mg/dL
Specific Gravity, Urine: 1.017 (ref 1.005–1.030)
Squamous Epithelial / LPF: NONE SEEN (ref 0–5)
pH: 5 (ref 5.0–8.0)

## 2020-08-30 LAB — COMPREHENSIVE METABOLIC PANEL
ALT: 20 U/L (ref 0–44)
AST: 21 U/L (ref 15–41)
Albumin: 3.8 g/dL (ref 3.5–5.0)
Alkaline Phosphatase: 76 U/L (ref 38–126)
Anion gap: 11 (ref 5–15)
BUN: 24 mg/dL — ABNORMAL HIGH (ref 6–20)
CO2: 25 mmol/L (ref 22–32)
Calcium: 9 mg/dL (ref 8.9–10.3)
Chloride: 101 mmol/L (ref 98–111)
Creatinine, Ser: 1.48 mg/dL — ABNORMAL HIGH (ref 0.61–1.24)
GFR, Estimated: 55 mL/min — ABNORMAL LOW (ref >60)
Glucose, Bld: 145 mg/dL — ABNORMAL HIGH (ref 70–99)
Potassium: 3.9 mmol/L (ref 3.5–5.1)
Sodium: 137 mmol/L (ref 135–145)
Total Bilirubin: 0.9 mg/dL (ref 0.3–1.2)
Total Protein: 7.4 g/dL (ref 6.5–8.1)

## 2020-08-30 LAB — CBG MONITORING, ED
Glucose-Capillary: 144 mg/dL — ABNORMAL HIGH (ref 70–99)
Glucose-Capillary: 162 mg/dL — ABNORMAL HIGH (ref 70–99)
Glucose-Capillary: 162 mg/dL — ABNORMAL HIGH (ref 70–99)

## 2020-08-30 LAB — HEMOGLOBIN A1C
Hgb A1c MFr Bld: 8.3 % — ABNORMAL HIGH (ref 4.8–5.6)
Mean Plasma Glucose: 191.51 mg/dL

## 2020-08-30 SURGERY — CYSTOSCOPY/URETEROSCOPY/HOLMIUM LASER/STENT PLACEMENT
Anesthesia: General | Laterality: Right

## 2020-08-30 MED ORDER — BELLADONNA ALKALOIDS-OPIUM 16.2-60 MG RE SUPP
RECTAL | Status: DC | PRN
  Administered 2020-08-30: 1 via RECTAL

## 2020-08-30 MED ORDER — OXYBUTYNIN CHLORIDE ER 10 MG PO TB24: 10.0000 mg | ORAL_TABLET | Freq: Every day | ORAL | 0 refills | Status: AC | PRN

## 2020-08-30 MED ORDER — SUCCINYLCHOLINE CHLORIDE 200 MG/10ML IV SOSY
PREFILLED_SYRINGE | INTRAVENOUS | Status: AC
  Filled 2020-08-30: qty 10

## 2020-08-30 MED ORDER — HYDROCODONE-ACETAMINOPHEN 5-325 MG PO TABS: 1.0000 | ORAL_TABLET | Freq: Four times a day (QID) | ORAL | 0 refills | Status: AC | PRN

## 2020-08-30 MED ORDER — DEXTROSE 5 % IV SOLN
INTRAVENOUS | Status: DC | PRN
  Administered 2020-08-30: 3 g via INTRAVENOUS

## 2020-08-30 MED ORDER — TAMSULOSIN HCL 0.4 MG PO CAPS: 0.4000 mg | ORAL_CAPSULE | Freq: Every day | ORAL | 0 refills | Status: DC

## 2020-08-30 MED ORDER — CHLORHEXIDINE GLUCONATE 0.12 % MT SOLN
OROMUCOSAL | Status: AC
  Administered 2020-08-30: 15 mL via OROMUCOSAL
  Filled 2020-08-30: qty 15

## 2020-08-30 MED ORDER — ONDANSETRON HCL 4 MG/2ML IJ SOLN: 4.0000 mg | Freq: Once | INTRAMUSCULAR | Status: DC | PRN

## 2020-08-30 MED ORDER — DEXAMETHASONE SODIUM PHOSPHATE 10 MG/ML IJ SOLN
INTRAMUSCULAR | Status: AC
  Filled 2020-08-30: qty 1

## 2020-08-30 MED ORDER — ONDANSETRON HCL 4 MG/2ML IJ SOLN
INTRAMUSCULAR | Status: AC
  Filled 2020-08-30: qty 2

## 2020-08-30 MED ORDER — ONDANSETRON HCL 4 MG/2ML IJ SOLN
4.0000 mg | Freq: Once | INTRAMUSCULAR | Status: AC
  Administered 2020-08-30: 4 mg via INTRAVENOUS
  Filled 2020-08-30: qty 2

## 2020-08-30 MED ORDER — MIDAZOLAM HCL 2 MG/2ML IJ SOLN
INTRAMUSCULAR | Status: DC | PRN
  Administered 2020-08-30: 2 mg via INTRAVENOUS

## 2020-08-30 MED ORDER — HYDROMORPHONE HCL 1 MG/ML IJ SOLN
0.5000 mg | Freq: Once | INTRAMUSCULAR | Status: AC
Start: 2020-08-30 — End: 2020-08-30
  Administered 2020-08-30: 0.5 mg via INTRAVENOUS
  Filled 2020-08-30: qty 1

## 2020-08-30 MED ORDER — FENTANYL CITRATE (PF) 100 MCG/2ML IJ SOLN
INTRAMUSCULAR | Status: AC
  Filled 2020-08-30: qty 2

## 2020-08-30 MED ORDER — FENTANYL CITRATE (PF) 100 MCG/2ML IJ SOLN
25.0000 ug | INTRAMUSCULAR | Status: DC | PRN
  Administered 2020-08-30 (×4): 25 ug via INTRAVENOUS

## 2020-08-30 MED ORDER — CEFAZOLIN SODIUM 1 G IJ SOLR
INTRAMUSCULAR | Status: AC
  Filled 2020-08-30: qty 10

## 2020-08-30 MED ORDER — IOHEXOL 180 MG/ML  SOLN
INTRAMUSCULAR | Status: DC | PRN
  Administered 2020-08-30: 20 mL

## 2020-08-30 MED ORDER — PROPOFOL 10 MG/ML IV BOLUS
INTRAVENOUS | Status: DC | PRN
Start: 2020-08-30 — End: 2020-08-30
  Administered 2020-08-30: 150 mg via INTRAVENOUS

## 2020-08-30 MED ORDER — CHLORHEXIDINE GLUCONATE 0.12 % MT SOLN: 15.0000 mL | Freq: Once | OROMUCOSAL | Status: AC

## 2020-08-30 MED ORDER — CEPHALEXIN 500 MG PO CAPS: 500.0000 mg | ORAL_CAPSULE | Freq: Every day | ORAL | 0 refills | Status: DC

## 2020-08-30 MED ORDER — SUCCINYLCHOLINE CHLORIDE 20 MG/ML IJ SOLN
INTRAMUSCULAR | Status: DC | PRN
  Administered 2020-08-30: 100 mg via INTRAVENOUS

## 2020-08-30 MED ORDER — DEXAMETHASONE SODIUM PHOSPHATE 10 MG/ML IJ SOLN
INTRAMUSCULAR | Status: DC | PRN
  Administered 2020-08-30: 10 mg via INTRAVENOUS

## 2020-08-30 MED ORDER — PROPOFOL 10 MG/ML IV BOLUS
INTRAVENOUS | Status: AC
  Filled 2020-08-30: qty 20

## 2020-08-30 MED ORDER — LIDOCAINE HCL (PF) 2 % IJ SOLN
INTRAMUSCULAR | Status: AC
  Filled 2020-08-30: qty 5

## 2020-08-30 MED ORDER — MIDAZOLAM HCL 2 MG/2ML IJ SOLN
INTRAMUSCULAR | Status: AC
  Filled 2020-08-30: qty 2

## 2020-08-30 MED ORDER — FENTANYL CITRATE (PF) 100 MCG/2ML IJ SOLN
INTRAMUSCULAR | Status: DC | PRN
  Administered 2020-08-30: 100 ug via INTRAVENOUS

## 2020-08-30 MED ORDER — LACTATED RINGERS IV BOLUS
1000.0000 mL | Freq: Once | INTRAVENOUS | Status: AC
  Administered 2020-08-30: 1000 mL via INTRAVENOUS

## 2020-08-30 MED ORDER — INSULIN ASPART 100 UNIT/ML ~~LOC~~ SOLN
0.0000 [IU] | SUBCUTANEOUS | Status: DC
  Administered 2020-08-30: 2 [IU] via SUBCUTANEOUS
  Filled 2020-08-30: qty 1

## 2020-08-30 MED ORDER — LIDOCAINE HCL (CARDIAC) PF 100 MG/5ML IV SOSY
PREFILLED_SYRINGE | INTRAVENOUS | Status: DC | PRN
  Administered 2020-08-30: 100 mg via INTRAVENOUS

## 2020-08-30 MED ORDER — SODIUM CHLORIDE 0.9 % IV SOLN: INTRAVENOUS | Status: DC

## 2020-08-30 MED ORDER — EPHEDRINE 5 MG/ML INJ
INTRAVENOUS | Status: AC
  Filled 2020-08-30: qty 10

## 2020-08-30 MED ORDER — ACETAMINOPHEN 500 MG PO TABS
1000.0000 mg | ORAL_TABLET | Freq: Once | ORAL | Status: AC
  Administered 2020-08-30: 1000 mg via ORAL
  Filled 2020-08-30: qty 2

## 2020-08-30 MED ORDER — SODIUM CHLORIDE 0.9 % IV SOLN
1.0000 g | Freq: Once | INTRAVENOUS | Status: DC
  Filled 2020-08-30: qty 10

## 2020-08-30 MED ORDER — ONDANSETRON HCL 4 MG/2ML IJ SOLN
INTRAMUSCULAR | Status: DC | PRN
  Administered 2020-08-30: 4 mg via INTRAVENOUS

## 2020-08-30 MED ORDER — EPHEDRINE SULFATE 50 MG/ML IJ SOLN
INTRAMUSCULAR | Status: DC | PRN
  Administered 2020-08-30: 25 mg via INTRAVENOUS

## 2020-08-30 SURGICAL SUPPLY — 33 items
BAG DRAIN CYSTO-URO LG1000N (MISCELLANEOUS) ×4 IMPLANT
BRUSH SCRUB EZ 1% IODOPHOR (MISCELLANEOUS) ×4 IMPLANT
CATH URET FLEX-TIP 2 LUMEN 10F (CATHETERS) ×4 IMPLANT
CATH URETL 5X70 OPEN END (CATHETERS) IMPLANT
CNTNR SPEC 2.5X3XGRAD LEK (MISCELLANEOUS)
CONT SPEC 4OZ STER OR WHT (MISCELLANEOUS)
CONT SPEC 4OZ STRL OR WHT (MISCELLANEOUS)
CONTAINER SPEC 2.5X3XGRAD LEK (MISCELLANEOUS) IMPLANT
DRAPE UTILITY 15X26 TOWEL STRL (DRAPES) ×4 IMPLANT
FIBER LASER TRAC TIP (UROLOGICAL SUPPLIES) IMPLANT
GLOVE BIOGEL PI IND STRL 7.5 (GLOVE) ×2 IMPLANT
GLOVE BIOGEL PI INDICATOR 7.5 (GLOVE) ×2
GOWN STRL REUS W/ TWL LRG LVL3 (GOWN DISPOSABLE) ×2 IMPLANT
GOWN STRL REUS W/ TWL XL LVL3 (GOWN DISPOSABLE) ×2 IMPLANT
GOWN STRL REUS W/TWL LRG LVL3 (GOWN DISPOSABLE) ×4
GOWN STRL REUS W/TWL XL LVL3 (GOWN DISPOSABLE) ×4
GUIDEWIRE GREEN .038 145CM (MISCELLANEOUS) ×4 IMPLANT
GUIDEWIRE STR DUAL SENSOR (WIRE) ×8 IMPLANT
INFUSOR MANOMETER BAG 3000ML (MISCELLANEOUS) ×4 IMPLANT
INTRODUCER DILATOR DOUBLE (INTRODUCER) IMPLANT
KIT TURNOVER CYSTO (KITS) ×4 IMPLANT
MANIFOLD NEPTUNE II (INSTRUMENTS) ×4 IMPLANT
PACK CYSTO AR (MISCELLANEOUS) ×4 IMPLANT
SET CYSTO W/LG BORE CLAMP LF (SET/KITS/TRAYS/PACK) ×4 IMPLANT
SHEATH URETERAL 12FRX35CM (MISCELLANEOUS) IMPLANT
SOL .9 NS 3000ML IRR  AL (IV SOLUTION) ×4
SOL .9 NS 3000ML IRR UROMATIC (IV SOLUTION) ×2 IMPLANT
STENT URET 6FRX24 CONTOUR (STENTS) IMPLANT
STENT URET 6FRX26 CONTOUR (STENTS) ×4 IMPLANT
SURGILUBE 2OZ TUBE FLIPTOP (MISCELLANEOUS) ×4 IMPLANT
SYR 10ML LL (SYRINGE) ×4 IMPLANT
VALVE UROSEAL ADJ ENDO (VALVE) ×4 IMPLANT
WATER STERILE IRR 1000ML POUR (IV SOLUTION) ×4 IMPLANT

## 2020-08-30 NOTE — ED Notes (Signed)
Pt transported to OR

## 2020-08-30 NOTE — ED Provider Notes (Signed)
[facility] Emergency Department Provider Note  ____________________________________________   Event Date/Time   First MD Initiated Contact with Patient 08/30/20 [phone]     (approximate)  I have reviewed the triage vital signs and the nursing notes.   HISTORY  Chief Complaint Flank Pain   HPI SIVAN QUAST is a 59 y.o. male is has medical history of HTN, HDL, DM, CAD, and very remote kidney stone who presents for assessment of proximately 4 days of right lower back pain right leg pain and right lower extremity pain.  Patient states his associate with some nausea but he has not had any vomiting, chest pain, cough, fevers, chills, gross blood in his urine or burning with urination.  He states initially was little constipated but then had some diarrhea last day.  States he was seen by his PCP and prescribed hydrocodone but this is not significantly help.  Denies any rash or extremity pain.  No recent falls or injuries.         Past Medical History:  Diagnosis Date  . CHF (congestive heart failure) ([place])   . Coronary artery disease   . Diabetes mellitus without complication ([place])   . Hyperlipidemia   . Hypertension   . Kidney stone     Patient Active Problem List   Diagnosis Date Noted  . CAD (coronary artery disease) 07/28/2017  . Chronic diastolic heart failure ([place]) 04/19/2017  . Diabetes ([place]) 04/19/2017  . HTN (hypertension) 04/19/2017  . Obstructive sleep apnea 04/19/2017  . S/P cardiac catheterization 04/19/2017  . Chest pain 04/05/2017    Past Surgical History:  Procedure Laterality Date  . COLONOSCOPY WITH PROPOFOL N/A 02/08/2020   Procedure: COLONOSCOPY WITH PROPOFOL;  Surgeon: Toledo, Benay Pike, MD;  Location: [facility] ENDOSCOPY;  Service: Gastroenterology;  Laterality: N/A;  . CORONARY ANGIOPLASTY WITH STENT PLACEMENT    . LEFT HEART CATH AND CORONARY ANGIOGRAPHY N/A 04/07/2017   Procedure: Left Heart Cath and Coronary Angiography and  possible PCI;  Surgeon: Yolonda Kida, MD;  Location: [place] [facility];  Service: Cardiovascular;  Laterality: N/A;    Prior to Admission medications   Medication Sig Start Date End Date Taking? Authorizing Provider  aspirin 325 MG tablet Take 325 mg by mouth daily.     [provider]  furosemide (LASIX) 20 MG tablet Take 1 tablet (20 mg total) by mouth 2 (two) times daily. Patient taking differently: Take 20 mg by mouth 2 (two) times daily.  04/07/17 02/08/20  Dustin Flock, MD  Insulin Human (INSULIN PUMP) SOLN Inject into the skin.     [provider]  isosorbide mononitrate (IMDUR) 30 MG 24 hr tablet Take 2 tablets (60 mg total) daily by mouth. 07/29/17   Fritzi Mandes, MD  meloxicam (MOBIC) 7.5 MG tablet Take 7.5 mg by mouth daily.     [provider]  Multiple Vitamin (MULTIVITAMIN WITH MINERALS) TABS tablet Take 1 tablet by mouth daily.    [provider]  nebivolol (BYSTOLIC) 10 MG tablet Take 10 mg by mouth daily.    [provider]  prasugrel (EFFIENT) 10 MG TABS tablet TAKE ONE TABLET BY MOUTH EVERY DAY 01/04/17   [provider]  rosuvastatin (CRESTOR) 10 MG tablet Take 1 tablet (10 mg total) by mouth daily. 04/08/17   Dustin Flock, MD    Allergies Trulicity [dulaglutide] and Sulfa antibiotics  Family History  Problem Relation Age of Onset  . CAD Father   . Heart failure  Brother     Social History Social History   Tobacco Use  . Smoking status: Never Smoker  . Smokeless tobacco: Never Used  Vaping Use  . Vaping Use: Never used  Substance Use Topics  . Alcohol use: No  . Drug use: No    Review of Systems  Review of Systems  Constitutional: Negative for chills and fever.  HENT: Negative for sore throat.   Eyes: Negative for pain.  Respiratory: Negative for cough and stridor.   Cardiovascular: Negative for chest pain.  Gastrointestinal: Positive for abdominal pain ( RLQ) and nausea. Negative for  vomiting.  Genitourinary: Positive for flank pain.  Musculoskeletal: Negative for myalgias.  Skin: Negative for rash.  Neurological: Negative for seizures, loss of consciousness and headaches.  Psychiatric/Behavioral: Negative for suicidal ideas.  All other systems reviewed and are negative.     ____________________________________________   PHYSICAL EXAM:  VITAL SIGNS: ED Triage Vitals  Enc Vitals Group     BP 08/30/20 0648 (!) 160/79     Pulse Rate 08/30/20 0648 67     Resp 08/30/20 0648 18     Temp 08/30/20 0648 98.2 F (36.8 C)     Temp Source 08/30/20 0648 Oral     SpO2 08/30/20 0642 98 %     Weight 08/30/20 0646 (!) 341 lb (154.7 kg)     Height 08/30/20 0646 5\' 10"  (1.778 m)     Head Circumference --      Peak Flow --      Pain Score 08/30/20 0644 8     Pain Loc --      Pain Edu? --      Excl. in [place]? --    Vitals:   08/30/20 0642 08/30/20 0648  BP:  (!) 160/79  Pulse:  67  Resp:  18  Temp:  98.2 F (36.8 C)  SpO2: 98% 97%   Physical Exam Vitals and nursing note reviewed.  Constitutional:      Appearance: He is well-developed and well-nourished. He is obese.  HENT:     Head: Normocephalic and atraumatic.     Right Ear: External ear normal.     Left Ear: External ear normal.     Nose: Nose normal.     Mouth/Throat:     Mouth: Mucous membranes are dry.  Eyes:     Conjunctiva/sclera: Conjunctivae normal.  Cardiovascular:     Rate and Rhythm: Normal rate and regular rhythm.     Heart sounds: No murmur heard.   Pulmonary:     Effort: Pulmonary effort is normal. No respiratory distress.     Breath sounds: Normal breath sounds.  Abdominal:     Palpations: Abdomen is soft.     Tenderness: There is no abdominal tenderness. There is right CVA tenderness. There is no left CVA tenderness.  Musculoskeletal:        General: No edema.     Cervical back: Neck supple.  Skin:    General: Skin is warm and dry.     Capillary Refill: Capillary refill takes 2 to  3 seconds.  Neurological:     Mental Status: He is alert and oriented to person, place, and time.  Psychiatric:        Mood and Affect: Mood and affect and mood normal.      ____________________________________________   LABS (all labs ordered are listed, but only abnormal results are displayed)  Labs Reviewed  COMPREHENSIVE METABOLIC PANEL - Abnormal; Notable for the following  components:      Result Value   Glucose, Bld 145 (*)    BUN 24 (*)    Creatinine, Ser 1.48 (*)    GFR, Estimated 55 (*)    All other components within normal limits  URINALYSIS, COMPLETE (UACMP) WITH MICROSCOPIC - Abnormal; Notable for the following components:   Color, Urine YELLOW (*)    APPearance HAZY (*)    Hgb urine dipstick MODERATE (*)    Leukocytes,Ua TRACE (*)    All other components within normal limits  URINE CULTURE  RESP PANEL BY RT-PCR (FLU A&B, COVID) ARPGX2  CBC WITH DIFFERENTIAL/PLATELET  HEMOGLOBIN A1C   ____________________________________________  ____________________________________________  RADIOLOGY  ED MD interpretation: 6 x 8 mm right proximal ureteral stone.  No other acute intra-abdominal pathology.  Official radiology report(s): CT Renal Stone Study  Result Date: 08/30/2020 CLINICAL DATA:  Right flank pain. EXAM: CT ABDOMEN AND PELVIS WITHOUT CONTRAST TECHNIQUE: Multidetector CT imaging of the abdomen and pelvis was performed following the standard protocol without IV contrast. COMPARISON:  CT scan 12/13/2019 FINDINGS: Lower chest: The lung bases are clear of an acute process. No pulmonary nodules or pleural effusions. Heart is upper limits of normal in size. Age advanced coronary artery calcifications are noted. The distal esophagus is grossly normal. Hepatobiliary: No hepatic lesions are identified without contrast. No intrahepatic biliary dilatation. The gallbladder appears normal. No common bile duct dilatation. Pancreas: No mass, inflammation or ductal dilatation. A  few small scattered calcifications are stable and could be related to prior inflammation. Spleen: Normal size. No focal lesions. Adrenals/Urinary Tract: The adrenal glands are normal and stable Small left renal calculi are stable. No obstructing left ureteral calculi. No right-sided renal calculi but there is moderate right-sided hydronephrosis and upper hydroureter due to an obstructing 8 x 6 mm calculus in the upper right ureter located at the L3 level. No distal right ureteral calculi. No bladder calculi. No worrisome renal or bladder lesions are identified without contrast. Stomach/Bowel: The stomach, duodenum, small bowel and colon are grossly normal without oral contrast. No acute inflammatory changes, mass lesions or obstructive findings. High anterior cecum. The terminal ileum is normal. The appendix is normal. Vascular/Lymphatic: Stable age advanced aortic and iliac artery calcifications. No aneurysm. No mesenteric or retroperitoneal mass or adenopathy. Reproductive: The prostate gland and seminal vesicles are unremarkable. Other: No pelvic mass or adenopathy. No free pelvic fluid collections. No inguinal mass or adenopathy. No abdominal wall hernia or subcutaneous lesions. Musculoskeletal: No significant or acute bony findings. Moderate degenerative changes involving the lumbar spine. IMPRESSION: 1. 8 x 6 mm upper right ureteral calculus causing moderate right-sided hydroureteronephrosis. 2. Stable small left renal calculi. 3. No worrisome renal or bladder lesions without contrast. 4. Stable age advanced aortic and iliac artery calcifications. 5. Age advanced coronary artery calcifications. 6. Aortic atherosclerosis. Aortic Atherosclerosis (ICD10-I70.0). Electronically Signed   By: Marijo Sanes M.D.   On: 08/30/2020 07:54    ____________________________________________   PROCEDURES  Procedure(s) performed (including Critical  Care):  Procedures   ____________________________________________   INITIAL IMPRESSION / ASSESSMENT AND PLAN / ED COURSE      Patient presents above to history exam for assessment approximately 4 to 5 days of some right lower back pain, right flank pain and right lower quadrant abdominal pain associate with some nausea.  No fevers, blood in the urine or pain with urination or other clear acute sick symptoms.  Patient is hypertensive with otherwise stable vital signs on arrival.  Primary differential includes but is not limited to kidney stone, pyelonephritis, cystitis, pain sinusitis, cholecystitis, pancreatitis.  CT obtained in triage does show evidence of decently large proximal right ureteral stone as described above.  No significant perinephric stranding to suggest pyelonephritis or evidence of abscess.  No evidence of cholecystitis, pancreatitis appendicitis or other clear acute intra-abdominal pathology.  There is evidence of significant atherosclerosis.  CBC is unremarkable.  CMP with evidence of AKI with a creatinine of 1.48 compared to 0.712 years ago with no other significant derangements.  Urine has moderate hemoglobin and trace ileus with 6-10 RBCs and WBCs.  I did discuss patient's presentation work-up with on-call urologist Dr. Diamantina Providence stated he would evaluate the patient for possible lithotripsy later this afternoon and plan for likely discharge afterwards.  IV fluids and below noted analgesia given.  We will plan to have patient go to the OR for lithotripsy.      ____________________________________________   FINAL CLINICAL IMPRESSION(S) / ED DIAGNOSES  Final diagnoses:  Kidney stone  AKI (acute kidney injury) ([place])    Medications  insulin aspart (novoLOG) injection 0-15 Units (has no administration in time range)  HYDROmorphone (DILAUDID) injection 0.5 mg (has no administration in time range)  acetaminophen (TYLENOL) tablet 1,000 mg (has no administration in time  range)  lactated ringers bolus 1,000 mL (1,000 mLs Intravenous New Bag/Given 08/30/20 0913)  ondansetron (ZOFRAN) injection 4 mg (4 mg Intravenous Given 08/30/20 3818)     ED Discharge Orders    None       Note:  This document was prepared using Dragon voice recognition software and may include unintentional dictation errors.   Lucrezia Starch, MD 08/30/20 [phone]

## 2020-08-30 NOTE — Anesthesia Preprocedure Evaluation (Signed)
Anesthesia Evaluation  Patient identified by MRN, date of birth, ID band Patient awake    Reviewed: Allergy & Precautions, H&P , NPO status , reviewed documented beta blocker date and time   Airway Mallampati: III  TM Distance: >3 FB Neck ROM: full    Dental  (+) Teeth Intact   Pulmonary sleep apnea and Continuous Positive Airway Pressure Ventilation ,    Pulmonary exam normal        Cardiovascular hypertension, + CAD, + Cardiac Stents and +CHF  Normal cardiovascular exam  2018 Cath Conclusion Diagnostic cardiac catheter from right groin Normal overall left ventricular function of 60% Stent proximal LAD rather patient Diagonal 1 with moderate to severe disease Circumflex moderate disease RCA relatively free of disease and branch Recommend medical therapy   Neuro/Psych negative neurological ROS  negative psych ROS   GI/Hepatic Neg liver ROS, neg GERD  ,  Endo/Other  diabetesMorbid obesity  Renal/GU stones  negative genitourinary   Musculoskeletal negative musculoskeletal ROS (+)   Abdominal   Peds negative pediatric ROS (+)  Hematology negative hematology ROS (+)   Anesthesia Other Findings Past Medical History: No date: CHF (congestive heart failure) (H[facility]) No date: Coronary artery disease No date: Diabetes mellitus without complication ([facility]) No date: Hyperlipidemia No date: Hypertension  Past Surgical History: No date: CORONARY ANGIOPLASTY WITH STENT PLACEMENT 04/07/2017: LEFT HEART CATH AND CORONARY ANGIOGRAPHY; N/A     Comment:  Procedure: Left Heart Cath and Coronary Angiography and               possible PCI;  Surgeon: Yolonda Kida, MD;                Location: [place] [facility];  Service: Cardiovascular;              Laterality: N/A;   Education re OSA and TIVA done  Reproductive/Obstetrics                             Anesthesia Physical  Anesthesia  Plan  ASA: III  Anesthesia Plan: General   Post-op Pain Management:    Induction: Intravenous  PONV Risk Score and Plan:   Airway Management Planned: Oral ETT  Additional Equipment:   Intra-op Plan:   Post-operative Plan: Extubation in OR  Informed Consent: I have reviewed the patients History and Physical, chart, labs and discussed the procedure including the risks, benefits and alternatives for the proposed anesthesia with the patient or authorized representative who has indicated his/her understanding and acceptance.     Dental Advisory Given and Dental advisory given  Plan Discussed with: CRNA  Anesthesia Plan Comments:         Anesthesia Quick Evaluation

## 2020-08-30 NOTE — ED Triage Notes (Signed)
EMS brings pt in from home; dx kidney stone on Tuesday at [facility] via labs and urine (no imaging); took rx hydrocodone PTA; pt reports rt flank pain radiating into abd and groin accomp by nausea

## 2020-08-30 NOTE — H&P (Signed)
08/30/20 9:45 AM   Alene Mires 1961/03/01 [id number]  CC: Right flank pain  HPI: I was consulted on Mr. Heidelberg by Dr. Tamala Julian in the ED for uncontrolled right-sided flank pain.  He is a 59 year old comorbid male with medical history notable for morbid obesity and BMI of 49, insulin-dependent diabetes, CAD on anticoagulation with aspirin and Effient, and congestive heart failure who presents with 1 week of poorly controlled right-sided flank pain.  CT today showed an 8 mm right proximal ureteral stone with upstream hydronephrosis.  He is afebrile, and labs notable for no leukocytosis, and AKI with creatinine 1.4 from a baseline of 0.7.  Urinalysis is relatively benign with no bacteria, 6-10 RBCs, 6-10 WBCs, trace leukocytes, nitrite negative.  He has 1 prior stone episode over 30 years ago, and has never required intervention.  He has had multiple ED and urgent care visits with poorly controlled pain.  He denies any chest pain, shortness of breath, dysuria, fevers or chills.  PMH: Past Medical History:  Diagnosis Date  . CHF (congestive heart failure) ([place])   . Coronary artery disease   . Diabetes mellitus without complication ([place])   . Hyperlipidemia   . Hypertension   . Kidney stone     Surgical History: Past Surgical History:  Procedure Laterality Date  . COLONOSCOPY WITH PROPOFOL N/A 02/08/2020   Procedure: COLONOSCOPY WITH PROPOFOL;  Surgeon: Toledo, Benay Pike, MD;  Location: [facility] ENDOSCOPY;  Service: Gastroenterology;  Laterality: N/A;  . CORONARY ANGIOPLASTY WITH STENT PLACEMENT    . LEFT HEART CATH AND CORONARY ANGIOGRAPHY N/A 04/07/2017   Procedure: Left Heart Cath and Coronary Angiography and possible PCI;  Surgeon: Yolonda Kida, MD;  Location: [place] [facility];  Service: Cardiovascular;  Laterality: N/A;    Family History: Family History  Problem Relation Age of Onset  . CAD Father   . Heart failure Brother     Social History:  reports that he has  never smoked. He has never used smokeless tobacco. He reports that he does not drink alcohol and does not use drugs.  Physical Exam: BP (!) 160/79 (BP Location: Left Arm)   Pulse 67   Temp 98.2 F (36.8 C) (Oral)   Resp 18   Ht 5\' 10"  (1.778 m)   Wt (!) 154.7 kg   SpO2 97%   BMI 48.93 kg/m    Constitutional:  Alert and oriented, No acute distress.  Obese Cardiovascular: Regular rate and rhythm Respiratory: Distant breath sounds, clear to auscultation bilaterally GI: Abdomen is soft, nontender, nondistended, no abdominal masses GU: Right CVA tenderness Lymph: No cervical or inguinal lymphadenopathy. Skin: No rashes, bruises or suspicious lesions.  Lower extremity edema Neurologic: Grossly intact, no focal deficits, moving all 4 extremities. Psychiatric: Normal mood and affect.  Laboratory Data: Reviewed, see HPI  Pertinent Imaging: I have personally viewed and interpreted the CT today showing 8 mm right proximal ureteral stone with upstream hydronephrosis.  Assessment & Plan:   59 year old comorbid male with 8 mm right proximal ureteral stone and multiple ED/urgent care visits for poorly controlled pain.  He also has an AKI with creatinine of 1.48 from a baseline of 0.41.  We discussed options at length including observation with medical expulsive therapy or ureteroscopy, laser lithotripsy, and stent placement.  He is not a candidate for shockwave lithotripsy secondary to his BMI and anticoagulation.  We discussed at length that he is high risk for any surgical intervention with his numerous comorbidities and morbid  obesity.  We specifically discussed the risks ureteroscopy including bleeding, infection/sepsis, stent related symptoms including flank pain/urgency/frequency/incontinence/dysuria, ureteral injury, inability to access stone, or need for staged or additional procedures.  We discussed at length that with his morbid obesity and anticoagulation, he may require staged  procedure.  Right ureteroscopy, laser lithotripsy, stent placement today Anticipate discharge home if doing well postop   Nickolas Madrid, MD 08/30/2020  [place] [address] [place], [place] [address] [phone]

## 2020-08-30 NOTE — Discharge Instructions (Signed)

## 2020-08-30 NOTE — Anesthesia Procedure Notes (Signed)
Procedure Name: Intubation Performed by: Gaynelle Cage, CRNA Pre-anesthesia Checklist: Patient identified, Emergency Drugs available, Suction available and Patient being monitored Patient Re-evaluated:Patient Re-evaluated prior to induction Oxygen Delivery Method: Circle system utilized Preoxygenation: Pre-oxygenation with 100% oxygen Induction Type: IV induction and Cricoid Pressure applied Laryngoscope Size: Glidescope and 4 Grade View: Grade II Tube type: Oral Tube size: 8.0 mm Number of attempts: 1 Airway Equipment and Method: Stylet and Patient positioned with wedge pillow Placement Confirmation: ETT inserted through vocal cords under direct vision,  positive ETCO2 and breath sounds checked- equal and bilateral Secured at: 22 cm Tube secured with: Tape Dental Injury: Teeth and Oropharynx as per pre-operative assessment

## 2020-08-30 NOTE — Progress Notes (Signed)
Pt not wanted to go home, spoke with Dr Diamantina Providence and then with pt, he will have antibiotic, pain meds at home and should be ok there, patient agreeable

## 2020-08-30 NOTE — Op Note (Signed)
Date of procedure: 08/30/20  Preoperative diagnosis:  1. Right proximal ureteral stone  Postoperative diagnosis:  1. Same  Procedure: 1. Cystoscopy, right retrograde pyelogram with intraoperative interpretation, right ureteral stent placement  Surgeon: Nickolas Madrid, MD  Anesthesia: General  Complications: None  Intraoperative findings:  1.  Normal cystoscopy, subtle bulbar urethral narrowing, able to bypass with scope 2.  Unable to advance ureteroscope into right ureteral orifice, stent placed  EBL: Minimal  Specimens: None  Drains: Right 6 French by 26 cm ureteral stent  Indication: ADRIANE GUGLIELMO is a 59 y.o. patient with numerous comorbidities including morbid obesity, diabetes, CAD, and CHF who presented with refractory renal colic secondary to an 8 mm right proximal ureteral stone.  After reviewing the management options for treatment, they elected to proceed with the above surgical procedure(s). We have discussed the potential benefits and risks of the procedure, side effects of the proposed treatment, the likelihood of the patient achieving the goals of the procedure, and any potential problems that might occur during the procedure or recuperation. Informed consent has been obtained.  Description of procedure:  The patient was taken to the operating room and general anesthesia was induced. SCDs were placed for DVT prophylaxis. The patient was placed in the dorsal lithotomy position, prepped and draped in the usual sterile fashion, and preoperative antibiotics(Ancef) were administered. A preoperative time-out was performed.   A 21 French rigid cystoscope was used to intubate the urethra.  There was subtle narrowing in the bulbar urethra that was easily able to be bypassed with the cystoscope.  Prostate was small.  Thorough cystoscopy revealed no suspicious lesions, and the ureteral orifices were orthotopic bilaterally.  I started by performing a retrograde pyelogram on the  right side with a 5 [place] access catheter which showed a filling defect in the proximal ureter consistent with his known stone and upstream hydronephrosis.  With the aid of the access catheter, I was able to navigate a sensor wire alongside the stone into the upper pole.  A dual-lumen ureteral access catheter was used to add a second sensor wire, but was only able to be advanced into the first part of distal ureter.  I attempted to pass the single channel digital flexible ureteroscope over the wire but met resistance at the ureteral orifice.  One of the sensor wires was exchanged for a Super Stiff wire, but again the ureteroscope met resistance at the ureteral orifice.  At this point I opted to place a ureteral stent, and return later for staged ureteroscopy.  The rigid cystoscope was backloaded over the wire, and a 6 [place] by 26 cm ureteral stent was uneventfully placed with a curl in the upper pole, as well as under direct vision the bladder.  There was brisk drainage of urine through the side ports of the stent.  The bladder was drained and a belladonna suppository was placed.  Disposition: Stable to PACU  Plan: Follow-up in 2 weeks for repeat ureteroscopy on the right side after passive dilation with stent Anticipate discharge today if afebrile and pain controlled Keflex prophylaxis with stent in place  Nickolas Madrid, MD

## 2020-08-30 NOTE — Transfer of Care (Signed)
Immediate Anesthesia Transfer of Care Note  Patient: Philip Cook  Procedure(s) Performed: CYSTOSCOPY/URETEROSCOPY/STENT PLACEMENT (Right ) CYSTOSCOPY WITH RETROGRADE PYELOGRAM  Patient Location: PACU  Anesthesia Type:General  Level of Consciousness: drowsy  Airway & Oxygen Therapy: Patient Spontanous Breathing and Patient connected to face mask oxygen  Post-op Assessment: Report given to RN  Post vital signs: Reviewed and stable  Last Vitals:  Vitals Value Taken Time  BP 139/73 08/30/20 1238  Temp    Pulse 70 08/30/20 1241  Resp    SpO2 100 % 08/30/20 1241  Vitals shown include unvalidated device data.  Last Pain:  Vitals:   08/30/20 1111  TempSrc:   PainSc: 3          Complications: No complications documented.

## 2020-08-30 NOTE — H&P (View-Only) (Signed)
08/30/20 9:45 AM   Philip Cook March 11, 1961 [id number]  CC: Right flank pain  HPI: I was consulted on Philip Cook by Dr. Tamala Julian in the ED for uncontrolled right-sided flank pain.  He is a 59 year old comorbid male with medical history notable for morbid obesity and BMI of 49, insulin-dependent diabetes, CAD on anticoagulation with aspirin and Effient, and congestive heart failure who presents with 1 week of poorly controlled right-sided flank pain.  CT today showed an 8 mm right proximal ureteral stone with upstream hydronephrosis.  He is afebrile, and labs notable for no leukocytosis, and AKI with creatinine 1.4 from a baseline of 0.7.  Urinalysis is relatively benign with no bacteria, 6-10 RBCs, 6-10 WBCs, trace leukocytes, nitrite negative.  He has 1 prior stone episode over 30 years ago, and has never required intervention.  He has had multiple ED and urgent care visits with poorly controlled pain.  He denies any chest pain, shortness of breath, dysuria, fevers or chills.  PMH: Past Medical History:  Diagnosis Date  . CHF (congestive heart failure) ([place])   . Coronary artery disease   . Diabetes mellitus without complication ([place])   . Hyperlipidemia   . Hypertension   . Kidney stone     Surgical History: Past Surgical History:  Procedure Laterality Date  . COLONOSCOPY WITH PROPOFOL N/A 02/08/2020   Procedure: COLONOSCOPY WITH PROPOFOL;  Surgeon: Toledo, Benay Pike, MD;  Location: [facility] ENDOSCOPY;  Service: Gastroenterology;  Laterality: N/A;  . CORONARY ANGIOPLASTY WITH STENT PLACEMENT    . LEFT HEART CATH AND CORONARY ANGIOGRAPHY N/A 04/07/2017   Procedure: Left Heart Cath and Coronary Angiography and possible PCI;  Surgeon: Yolonda Kida, MD;  Location: [facility];  Service: Cardiovascular;  Laterality: N/A;    Family History: Family History  Problem Relation Age of Onset  . CAD Father   . Heart failure Brother     Social History:  reports that he has  never smoked. He has never used smokeless tobacco. He reports that he does not drink alcohol and does not use drugs.  Physical Exam: BP (!) 160/79 (BP Location: Left Arm)   Pulse 67   Temp 98.2 F (36.8 C) (Oral)   Resp 18   Ht 5\' 10"  (1.778 m)   Wt (!) 154.7 kg   SpO2 97%   BMI 48.93 kg/m    Constitutional:  Alert and oriented, No acute distress.  Obese Cardiovascular: Regular rate and rhythm Respiratory: Distant breath sounds, clear to auscultation bilaterally GI: Abdomen is soft, nontender, nondistended, no abdominal masses GU: Right CVA tenderness Lymph: No cervical or inguinal lymphadenopathy. Skin: No rashes, bruises or suspicious lesions.  Lower extremity edema Neurologic: Grossly intact, no focal deficits, moving all 4 extremities. Psychiatric: Normal mood and affect.  Laboratory Data: Reviewed, see HPI  Pertinent Imaging: I have personally viewed and interpreted the CT today showing 8 mm right proximal ureteral stone with upstream hydronephrosis.  Assessment & Plan:   59 year old comorbid male with 8 mm right proximal ureteral stone and multiple ED/urgent care visits for poorly controlled pain.  He also has an AKI with creatinine of 1.48 from a baseline of 0.41.  We discussed options at length including observation with medical expulsive therapy or ureteroscopy, laser lithotripsy, and stent placement.  He is not a candidate for shockwave lithotripsy secondary to his BMI and anticoagulation.  We discussed at length that he is high risk for any surgical intervention with his numerous comorbidities and morbid  obesity.  We specifically discussed the risks ureteroscopy including bleeding, infection/sepsis, stent related symptoms including flank pain/urgency/frequency/incontinence/dysuria, ureteral injury, inability to access stone, or need for staged or additional procedures.  We discussed at length that with his morbid obesity and anticoagulation, he may require staged  procedure.  Right ureteroscopy, laser lithotripsy, stent placement today Anticipate discharge home if doing well postop   Nickolas Madrid, MD 08/30/2020  [facility] [address] [place], [place] [address] [phone]

## 2020-08-31 LAB — URINE CULTURE: Culture: NO GROWTH

## 2020-09-02 NOTE — Anesthesia Postprocedure Evaluation (Signed)
Anesthesia Post Note  Patient: Philip Cook  Procedure(s) Performed: CYSTOSCOPY/URETEROSCOPY/STENT PLACEMENT (Right ) CYSTOSCOPY WITH RETROGRADE PYELOGRAM  Patient location during evaluation: PACU Anesthesia Type: General Level of consciousness: awake and alert and oriented Pain management: pain level controlled Vital Signs Assessment: post-procedure vital signs reviewed and stable Respiratory status: spontaneous breathing Cardiovascular status: blood pressure returned to baseline Anesthetic complications: no   No complications documented.   Last Vitals:  Vitals:   08/30/20 1401 08/30/20 1447  BP: 127/62 129/66  Pulse: 61 63  Resp: 16 18  Temp: 36.7 C   SpO2: 95% 97%    Last Pain:  Vitals:   08/30/20 1447  TempSrc:   PainSc: 0-No pain                 Jaymin Waln

## 2020-09-04 ENCOUNTER — Other Ambulatory Visit: Payer: Self-pay | Admitting: Urology

## 2020-09-04 DIAGNOSIS — N201 Calculus of ureter: Secondary | ICD-10-CM

## 2020-09-05 ENCOUNTER — Encounter
Admission: RE | Admit: 2020-09-05 | Discharge: 2020-09-05 | Disposition: A | Discharge status: Home / Self Care | Payer: BC Managed Care – PPO | Source: Ambulatory Visit | Attending: Urology | Admitting: Urology

## 2020-09-05 ENCOUNTER — Other Ambulatory Visit: Payer: Self-pay

## 2020-09-05 ENCOUNTER — Other Ambulatory Visit
Admission: RE | Admit: 2020-09-05 | Discharge: 2020-09-05 | Disposition: A | Discharge status: Home / Self Care | Payer: BC Managed Care – PPO | Source: Ambulatory Visit | Attending: Urology | Admitting: Urology

## 2020-09-05 DIAGNOSIS — Z20822 Contact with and (suspected) exposure to covid-19: Secondary | ICD-10-CM | POA: Insufficient documentation

## 2020-09-05 HISTORY — DX: Personal history of urinary calculi: Z87.442

## 2020-09-05 HISTORY — DX: Personal history of Methicillin resistant Staphylococcus aureus infection: Z86.14

## 2020-09-05 HISTORY — DX: Sleep apnea, unspecified: G47.30

## 2020-09-05 LAB — SARS CORONAVIRUS 2 (TAT 6-24 HRS): SARS Coronavirus 2: NEGATIVE

## 2020-09-05 NOTE — Patient Instructions (Addendum)
Your procedure is scheduled on: 09/09/20 Report to [facility]. YOU MUST CHECK IN AT THE ADMITTING DESK To find out your arrival time please call [phone] between 1PM - 3PM on 09/06/20. The secretary will call between 1-3 pm with your time  Remember: Instructions that are not followed completely may result in serious medical risk, up to and including death, or upon the discretion of your surgeon and anesthesiologist your surgery may need to be rescheduled.     _X__ 1. Do not eat food after midnight the night before your procedure.                 No gum chewing or hard candies. You may drink clear liquids up to 2 hours                 before you are scheduled to arrive for your surgery- DO not drink clear                 liquids within 2 hours of the start of your surgery.                 Clear Liquids include:  water, apple juice without pulp, clear carbohydrate                 drink such as Clearfast or Gatorade, Black Coffee or Tea (Do not add                 anything to coffee or tea). Diabetics water only  __X__2.  On the morning of surgery brush your teeth with toothpaste and water, you                 may rinse your mouth with mouthwash if you wish.  Do not swallow any              toothpaste of mouthwash.     _X__ 3.  No Alcohol for 24 hours before or after surgery.   _X__ 4.  Do Not Smoke or use e-cigarettes For 24 Hours Prior to Your Surgery.                 Do not use any chewable tobacco products for at least 6 hours prior to                 surgery.  ____  5.  Bring all medications with you on the day of surgery if instructed.   __X__  6.  Notify your doctor if there is any change in your medical condition      (cold, fever, infections).     Do not wear jewelry, make-up, hairpins, clips or nail polish. Do not wear lotions, powders, or perfumes.  Do not shave 48 hours prior to surgery. Men may shave face and  neck. Do not bring valuables to the hospital.    [facility] is not responsible for any belongings or valuables.  Contacts, dentures/partials or body piercings may not be worn into surgery. Bring a case for your contacts, glasses or hearing aids, a denture cup will be supplied. Leave your suitcase in the car. After surgery it may be brought to your room. For patients admitted to the hospital, discharge time is determined by your treatment team.   Patients discharged the day of surgery will not be allowed to drive home.   Please read over the following fact sheets that you were given:  MRSA Information  __X__ Take these medicines the morning of surgery with A SIP OF WATER:    1. isosorbide mononitrate (IMDUR) 60 MG 24 hr tablet  2. nebivolol (BYSTOLIC) 10 MG tablet  3. oxybutynin (DITROPAN XL) 10 MG 24 hr tablet  4. rosuvastatin (CRESTOR) 10 MG tablet  5.  6.  ____ Fleet Enema (as directed)   ____ Use CHG Soap/SAGE wipes as directed  ____ Use inhalers on the day of surgery  ____ Stop metformin/Janumet/Farxiga 2 days prior to surgery    ____ Take 1/2 of usual insulin dose the night before surgery. No insulin the morning          of surgery.   __X__ Stop Blood Thinners Coumadin/Plavix/Xarelto/Pleta/Pradaxa/Eliquis/Effient/Aspirin  on   Or contact your Surgeon, Cardiologist or Medical Doctor regarding  ability to stop your blood thinners  __X__ Stop Anti-inflammatories 7 days before surgery such as Advil, Ibuprofen, Motrin,  BC or Goodies Powder, Naprosyn, Naproxen, Aleve, Aspirin    __X__ Stop all herbal supplements, fish oil or vitamin E until after surgery.    ____ Bring C-Pap to the hospital.    Contact Dr Saralyn Pilar regarding ability to stop blood thinner and or aspirin for surgery.  Contact Dr Gabriel Carina for instructions on your insulin pump for the day of surgery.

## 2020-09-05 NOTE — Progress Notes (Signed)
[facility] Perioperative Services  Pre-Admission/Anesthesia Testing Clinical Review  Date: 09/06/20  Patient Demographics:  Name: Philip Cook DOB:   03/16/1961 MRN:   [id number]  Planned Surgical Procedure(s):    Case: [id number] Date/Time: 09/09/20 0715   Procedure: CYSTOSCOPY/URETEROSCOPY/HOLMIUM LASER/STENT EXCHANGE (Right )   Anesthesia type: Choice   Pre-op diagnosis: right ureteral stone   Location: [facility] OR ROOM 10 / [facility] ORS FOR ANESTHESIA GROUP   Surgeons: Billey Co, MD    NOTE: Available PAT nursing documentation and vital signs have been reviewed. Clinical nursing staff has updated patient's PMH/PSHx, current medication list, and drug allergies/intolerances to ensure comprehensive history available to assist in medical decision making as it pertains to the aforementioned surgical procedure and anticipated anesthetic course.   Clinical Discussion:  Philip Cook is a 59 y.o. male who is submitted for pre-surgical anesthesia review and Philip prior to him undergoing the above procedure. Patient has never been a smoker. Pertinent PMH includes: angina, CAD (s/p PCI), CHF, HTN, HLD, T2DM, OSAH (requires nocturnal PAP therapy), nephrolithiasis.  Patient is followed by cardiology Philip Pilar, MD). He was last seen in the cardiology clinic on 07/19/2020; notes reviewed.  At the time of his clinic visit, patient reported to be doing well overall from a cardiovascular perspective.  2 weeks prior to his clinic visit patient was seen by his PCP and reported increased shortness of breath.  Prescribed nitrate (Imdur) was increased to 90 mg daily, which in turn has resolved his shortness of breath.  Patient denied exertional angina, PND, orthopnea, palpitations, vertiginous symptoms, or presyncope/syncope.  Patient with chronic peripheral edema that remained unchanged from baseline.  Patient making efforts to remain active; still working full-time; DASI defined  functional capacity >4 METS.  Patient with significant cardiac history.  Review of cardiology notes indicate the patient underwent PCI in 06/2010 whereby and Xience stent was placed to the patient's mid LAD.  Repeat diagnostic left heart catheterization in 03/2017 revealed 50% stenosis of the posterior atrioventricular artery, 50% stenosis of the ostial first diagonal, 70% stenosis of the first diagonal, 50% stenosis distal LAD, 50% stenosis of the mid LCx, 50% stenosis ostial first marginal to first marginal, and a rather patent previously placed stent to the proximal LAD. LVEF 60%.  Patient underwent a stress echocardiogram in 06/2018 that revealed normal left ventricular systolic function; LVEF >44% (see full interpretation of cardiovascular testing below).  Blood pressure moderately controlled at 144/72 on currently prescribed nitrate and beta-blocker therapy.  Patient is on a statin for his HLD. T2DM moderately controlled; last hemoglobin A1c on 06/07/2020 was 8.0%. Patient is on daily DAPT therapy (ASA + prasugrel); compliant with therapy with no evidence of bleeding.  No changes were made to patient's medication regimen.  Patient to follow-up with outpatient cardiology in 3 months.  Patient scheduled to undergo urological procedure on 09/09/2020 with Dr. Nickolas Madrid.  Given patient's past medical history significant for cardiovascular disease and intervention, presurgical cardiac Philip was sought by the PAT team.  Per cardiology, "this patient is optimized for surgery and may proceed with the planned procedural course with a LOW risk stratification". Again, this patient is on daily DAPT therapy.  He has been instructed on recommendations from cardiology for holding his full dose ASA for 3 days and his prasugrel for 5 days prior to his procedure with plans on restarting postoperative as soon as deemed safe to do so by surgeon.  He denies previous perioperative complications with anesthesia. He  underwent a general anesthetic course here (ASA III) in 08/2020 with no documented complications. Patient has an insuline pump in place. I have reached out to endocrinologist for perioperative recommendations, however she Philip Carina, MD) is out of the office until 09/16/2020. Spoke with diabetes coordinator Philip Cook) and was advised that pump should remain in place for this procedure. DM program to follow should patient require inpatient admission.   Vitals with BMI 09/05/2020 08/30/2020 08/30/2020  Height 5\' 10"  - -  Weight 341 lbs - -  BMI 02.58 - -  Systolic - 527 782  Diastolic - 66 62  Pulse - 63 61    Providers/Specialists:   NOTE: Primary physician provider listed below. Patient may have been seen by APP or partner within same practice.   PROVIDER ROLE / SPECIALTY LAST Ranae Pila, MD Urology (Surgeon)  08/30/2020  [name], MD Primary Care Provider  07/05/2020  Isaias Cowman, MD Cardiology  07/19/2020   Allergies:  Trulicity [dulaglutide] and Sulfa antibiotics  Current Home Medications:   No current facility-administered medications for this encounter.   Marland Kitchen acetaminophen (TYLENOL) 500 MG tablet  . aspirin 325 MG tablet  . cephALEXin (KEFLEX) 500 MG capsule  . furosemide (LASIX) 20 MG tablet  . HUMULIN R 500 UNIT/ML injection  . Insulin Human (INSULIN PUMP) SOLN  . isosorbide mononitrate (IMDUR) 30 MG 24 hr tablet  . meloxicam (MOBIC) 15 MG tablet  . Multiple Vitamin (MULTIVITAMIN WITH MINERALS) TABS tablet  . nebivolol (BYSTOLIC) 10 MG tablet  . oxybutynin (DITROPAN XL) 10 MG 24 hr tablet  . prasugrel (EFFIENT) 10 MG TABS tablet  . rosuvastatin (CRESTOR) 10 MG tablet  . tamsulosin (FLOMAX) 0.4 MG CAPS capsule   History:   Past Medical History:  Diagnosis Date  . CHF (congestive heart failure) ([place])   . Coronary artery disease   . Diabetes mellitus without complication ([place])   . History of kidney stones   . Hx MRSA infection   .  Hyperlipidemia   . Hypertension   . Kidney stone   . Sleep apnea    Past Surgical History:  Procedure Laterality Date  . CARPAL TUNNEL RELEASE Bilateral   . COLONOSCOPY WITH PROPOFOL N/A 02/08/2020   Procedure: COLONOSCOPY WITH PROPOFOL;  Surgeon: Toledo, Benay Pike, MD;  Location: [facility] ENDOSCOPY;  Service: Gastroenterology;  Laterality: N/A;  . CORONARY ANGIOPLASTY WITH STENT PLACEMENT    . CYSTOSCOPY W/ RETROGRADES  08/30/2020   Procedure: CYSTOSCOPY WITH RETROGRADE PYELOGRAM;  Surgeon: Billey Co, MD;  Location: [facility] ORS;  Service: Urology;;  . CYSTOSCOPY/URETEROSCOPY/HOLMIUM LASER/STENT PLACEMENT Right 08/30/2020   Procedure: CYSTOSCOPY/URETEROSCOPY/STENT PLACEMENT;  Surgeon: Billey Co, MD;  Location: [facility] ORS;  Service: Urology;  Laterality: Right;  . LEFT HEART CATH AND CORONARY ANGIOGRAPHY N/A 04/07/2017   Procedure: Left Heart Cath and Coronary Angiography and possible PCI;  Surgeon: Yolonda Kida, MD;  Location: [facility];  Service: Cardiovascular;  Laterality: N/A;  . ROTATOR CUFF REPAIR Left    Family History  Problem Relation Age of Onset  . CAD Father   . Heart failure Brother    Social History   Tobacco Use  . Smoking status: Never Smoker  . Smokeless tobacco: Never Used  Vaping Use  . Vaping Use: Never used  Substance Use Topics  . Alcohol use: No  . Drug use: No    Pertinent Clinical Results:  LABS: Labs reviewed: Acceptable for surgery.  Hospital Outpatient Visit on 09/05/2020  Component Date Value Ref Range Status  . SARS Coronavirus 2 09/05/2020 NEGATIVE  NEGATIVE Final   Comment: (NOTE) SARS-CoV-2 target nucleic acids are NOT DETECTED.  The SARS-CoV-2 RNA is generally detectable in upper and lower respiratory specimens during the acute phase of infection. Negative results do not preclude SARS-CoV-2 infection, do not rule out co-infections with other pathogens, and should not be used as the sole basis for treatment or other  patient management decisions. Negative results must be combined with clinical observations, patient history, and epidemiological information. The expected result is Negative.  Fact Sheet for Patients: SugarRoll.be  Fact Sheet for Healthcare Providers: https://www.woods-mathews.com/  This test is not yet approved or cleared by the [place] FDA and  has been authorized for detection and/or diagnosis of SARS-CoV-2 by FDA under an Emergency Use Authorization (EUA). This EUA will remain  in effect (meaning this test can be used) for the duration of the COVID-19 declaration under Se                          ction 564(b)(1) of the Act, 21 U.S.C. section 360bbb-3(b)(1), unless the authorization is terminated or revoked sooner.  Performed at [facility], [place] [address], [place], [place] [address]     ECG: Date: 08/30/2020 Time ECG obtained: 1041 AM Rate: 56 bpm Rhythm: sinus bradycardia Axis (leads I and aVF): Normal Intervals: PR 166 ms. QRS 96 ms. QTc 413 ms. ST segment and T wave changes: No evidence of acute ST segment elevation or depression Comparison: Similar to previous tracing obtained on 07/28/2017   IMAGING / PROCEDURES: STRESS ECHOCARDIOGRAM done on 07/06/2018 1. LVEF >55% 2. Normal stress echocardiogram  LEFT HEART CATHETERIZATION AND CORONARY ANGIOGRAPHY done on 04/07/2017 1. 50% stenosis posterior atrioventricular artery 2. 50% stenosis ostial first diagonal 3. 70% stenosis first diagonal 4. 50% stenosis distal LAD 5. 50% stenosis mid LCx 6. 50% stenosis ostial first marginal to first marginal 7. LVEF 55-60% 8. LVEDP normal 9. Proximal LAD stent rather patent 10. RCA relatively free of disease 11. Medical therapy recommended   Impression and Plan:  Philip Cook has been referred for pre-anesthesia review and Philip prior to him undergoing the planned anesthetic and procedural courses. Available  labs, pertinent testing, and imaging results were personally reviewed by me. This patient has been appropriately cleared by cardiology with an overall LOW risk stratification.   Based on clinical review performed today (09/06/20), barring any significant acute changes in the patient's overall condition, it is anticipated that he will be able to proceed with the planned surgical intervention. Any acute changes in clinical condition may necessitate his procedure being postponed and/or cancelled. Pre-surgical instructions were reviewed with the patient during his PAT appointment and questions were fielded by PAT clinical staff.  Honor Loh, MSN, APRN, FNP-C, CEN [facility]  Peri-operative Services Nurse Practitioner Phone: [phone] 09/06/20 11:03 AM  NOTE: This note has been prepared using Dragon dictation software. Despite my best ability to proofread, there is always the potential that unintentional transcriptional errors may still occur from this process.

## 2020-09-06 ENCOUNTER — Other Ambulatory Visit: Payer: BC Managed Care – PPO

## 2020-09-06 NOTE — Pre-Procedure Instructions (Signed)
Cardiac clearance received from Dr Saralyn Pilar. Pt low risk. OK to hold ASA x 3 days and Effient 5 days. Called patient and made aware. Patent verbalized understanding. Patient still awaiting recommendations regarding insulin pump for DOS from Dr Gabriel Carina, states he sent message thru My Chart if no response by lunch today he stated he would call the office. Patient informed we would call him as well when we hear back from Dr Gabriel Carina.

## 2020-09-08 MED ORDER — DEXTROSE 5 % IV SOLN
3.0000 g | INTRAVENOUS | Status: AC
  Administered 2020-09-09: 08:00:00 3 g via INTRAVENOUS
  Filled 2020-09-08: qty 3

## 2020-09-08 MED ORDER — CHLORHEXIDINE GLUCONATE 0.12 % MT SOLN: 15.0000 mL | Freq: Once | OROMUCOSAL | Status: AC

## 2020-09-08 MED ORDER — ORAL CARE MOUTH RINSE: 15.0000 mL | Freq: Once | OROMUCOSAL | Status: AC

## 2020-09-08 MED ORDER — FAMOTIDINE 20 MG PO TABS: 20.0000 mg | ORAL_TABLET | Freq: Once | ORAL | Status: AC

## 2020-09-08 MED ORDER — SODIUM CHLORIDE 0.9 % IV SOLN: INTRAVENOUS | Status: DC

## 2020-09-09 ENCOUNTER — Ambulatory Visit: Payer: BC Managed Care – PPO

## 2020-09-09 ENCOUNTER — Encounter: Payer: Self-pay | Admitting: Urology

## 2020-09-09 ENCOUNTER — Ambulatory Visit
Admission: RE | Admit: 2020-09-09 | Discharge: 2020-09-09 | Disposition: A | Discharge status: Home / Self Care | Payer: BC Managed Care – PPO | Attending: Urology | Admitting: Urology

## 2020-09-09 ENCOUNTER — Other Ambulatory Visit: Payer: Self-pay

## 2020-09-09 ENCOUNTER — Encounter
Admission: RE | Disposition: A | Discharge status: Home / Self Care | Payer: Self-pay | Source: Home / Self Care | Attending: Urology

## 2020-09-09 ENCOUNTER — Ambulatory Visit: Payer: BC Managed Care – PPO | Admitting: Urgent Care

## 2020-09-09 DIAGNOSIS — I509 Heart failure, unspecified: Secondary | ICD-10-CM | POA: Insufficient documentation

## 2020-09-09 DIAGNOSIS — G4733 Obstructive sleep apnea (adult) (pediatric): Secondary | ICD-10-CM | POA: Diagnosis not present

## 2020-09-09 DIAGNOSIS — Z6841 Body Mass Index (BMI) 40.0 and over, adult: Secondary | ICD-10-CM | POA: Diagnosis not present

## 2020-09-09 DIAGNOSIS — Z87442 Personal history of urinary calculi: Secondary | ICD-10-CM | POA: Insufficient documentation

## 2020-09-09 DIAGNOSIS — E119 Type 2 diabetes mellitus without complications: Secondary | ICD-10-CM | POA: Diagnosis not present

## 2020-09-09 DIAGNOSIS — Z7982 Long term (current) use of aspirin: Secondary | ICD-10-CM | POA: Insufficient documentation

## 2020-09-09 DIAGNOSIS — Z794 Long term (current) use of insulin: Secondary | ICD-10-CM | POA: Diagnosis not present

## 2020-09-09 DIAGNOSIS — N179 Acute kidney failure, unspecified: Secondary | ICD-10-CM | POA: Diagnosis not present

## 2020-09-09 DIAGNOSIS — Z79899 Other long term (current) drug therapy: Secondary | ICD-10-CM | POA: Diagnosis not present

## 2020-09-09 DIAGNOSIS — N132 Hydronephrosis with renal and ureteral calculous obstruction: Secondary | ICD-10-CM | POA: Insufficient documentation

## 2020-09-09 DIAGNOSIS — I110 Hypertensive heart disease with heart failure: Secondary | ICD-10-CM | POA: Diagnosis not present

## 2020-09-09 DIAGNOSIS — N201 Calculus of ureter: Secondary | ICD-10-CM | POA: Diagnosis not present

## 2020-09-09 DIAGNOSIS — Z791 Long term (current) use of non-steroidal anti-inflammatories (NSAID): Secondary | ICD-10-CM | POA: Diagnosis not present

## 2020-09-09 DIAGNOSIS — I2510 Atherosclerotic heart disease of native coronary artery without angina pectoris: Secondary | ICD-10-CM | POA: Diagnosis not present

## 2020-09-09 DIAGNOSIS — Z955 Presence of coronary angioplasty implant and graft: Secondary | ICD-10-CM | POA: Insufficient documentation

## 2020-09-09 HISTORY — PX: CYSTOSCOPY/URETEROSCOPY/HOLMIUM LASER/STENT PLACEMENT: SHX6546

## 2020-09-09 LAB — GLUCOSE, CAPILLARY
Glucose-Capillary: 182 mg/dL — ABNORMAL HIGH (ref 70–99)
Glucose-Capillary: 170 mg/dL — ABNORMAL HIGH (ref 70–99)

## 2020-09-09 SURGERY — CYSTOSCOPY/URETEROSCOPY/HOLMIUM LASER/STENT PLACEMENT
Anesthesia: General | Laterality: Right

## 2020-09-09 MED ORDER — FENTANYL CITRATE (PF) 100 MCG/2ML IJ SOLN
INTRAMUSCULAR | Status: DC | PRN
  Administered 2020-09-09: 50 ug via INTRAVENOUS

## 2020-09-09 MED ORDER — CHLORHEXIDINE GLUCONATE 0.12 % MT SOLN
OROMUCOSAL | Status: AC
  Administered 2020-09-09: 07:00:00 15 mL via OROMUCOSAL
  Filled 2020-09-09: qty 15

## 2020-09-09 MED ORDER — SUCCINYLCHOLINE CHLORIDE 20 MG/ML IJ SOLN
INTRAMUSCULAR | Status: DC | PRN
  Administered 2020-09-09: 140 mg via INTRAVENOUS

## 2020-09-09 MED ORDER — LIDOCAINE HCL (CARDIAC) PF 100 MG/5ML IV SOSY
PREFILLED_SYRINGE | INTRAVENOUS | Status: DC | PRN
  Administered 2020-09-09: 100 mg via INTRAVENOUS

## 2020-09-09 MED ORDER — FENTANYL CITRATE (PF) 100 MCG/2ML IJ SOLN
INTRAMUSCULAR | Status: AC
  Filled 2020-09-09: qty 2

## 2020-09-09 MED ORDER — HYDROCODONE-ACETAMINOPHEN 5-325 MG PO TABS: 1.0000 | ORAL_TABLET | ORAL | 0 refills | Status: AC | PRN

## 2020-09-09 MED ORDER — PROPOFOL 10 MG/ML IV BOLUS
INTRAVENOUS | Status: AC
  Filled 2020-09-09: qty 20

## 2020-09-09 MED ORDER — PROPOFOL 10 MG/ML IV BOLUS
INTRAVENOUS | Status: DC | PRN
  Administered 2020-09-09: 200 mg via INTRAVENOUS

## 2020-09-09 MED ORDER — GLYCOPYRROLATE 0.2 MG/ML IJ SOLN
INTRAMUSCULAR | Status: DC | PRN
  Administered 2020-09-09: .2 mg via INTRAVENOUS

## 2020-09-09 MED ORDER — FAMOTIDINE 20 MG PO TABS
ORAL_TABLET | ORAL | Status: AC
  Administered 2020-09-09: 07:00:00 20 mg via ORAL
  Filled 2020-09-09: qty 1

## 2020-09-09 MED ORDER — EPHEDRINE SULFATE 50 MG/ML IJ SOLN
INTRAMUSCULAR | Status: DC | PRN
  Administered 2020-09-09 (×2): 5 mg via INTRAVENOUS

## 2020-09-09 MED ORDER — DEXMEDETOMIDINE (PRECEDEX) IN NS 20 MCG/5ML (4 MCG/ML) IV SYRINGE
PREFILLED_SYRINGE | INTRAVENOUS | Status: DC | PRN
  Administered 2020-09-09: 12 ug via INTRAVENOUS

## 2020-09-09 MED ORDER — SUGAMMADEX SODIUM 500 MG/5ML IV SOLN
INTRAVENOUS | Status: DC | PRN
  Administered 2020-09-09: 700 mg via INTRAVENOUS

## 2020-09-09 MED ORDER — DEXAMETHASONE SODIUM PHOSPHATE 10 MG/ML IJ SOLN
INTRAMUSCULAR | Status: DC | PRN
  Administered 2020-09-09: 5 mg via INTRAVENOUS

## 2020-09-09 MED ORDER — SODIUM CHLORIDE 0.9 % IV SOLN
INTRAVENOUS | Status: DC | PRN
  Administered 2020-09-09: 08:00:00 25 ug/min via INTRAVENOUS

## 2020-09-09 MED ORDER — PHENYLEPHRINE HCL (PRESSORS) 10 MG/ML IV SOLN
INTRAVENOUS | Status: DC | PRN
  Administered 2020-09-09: 300 ug via INTRAVENOUS
  Administered 2020-09-09: 200 ug via INTRAVENOUS

## 2020-09-09 MED ORDER — BELLADONNA ALKALOIDS-OPIUM 16.2-60 MG RE SUPP
RECTAL | Status: AC
  Filled 2020-09-09: qty 1

## 2020-09-09 MED ORDER — MIDAZOLAM HCL 2 MG/2ML IJ SOLN
INTRAMUSCULAR | Status: DC | PRN
  Administered 2020-09-09: 2 mg via INTRAVENOUS

## 2020-09-09 MED ORDER — ONDANSETRON HCL 4 MG/2ML IJ SOLN
INTRAMUSCULAR | Status: DC | PRN
  Administered 2020-09-09: 4 mg via INTRAVENOUS

## 2020-09-09 MED ORDER — MIDAZOLAM HCL 2 MG/2ML IJ SOLN
INTRAMUSCULAR | Status: AC
  Filled 2020-09-09: qty 2

## 2020-09-09 MED ORDER — ROCURONIUM BROMIDE 100 MG/10ML IV SOLN
INTRAVENOUS | Status: DC | PRN
  Administered 2020-09-09: 20 mg via INTRAVENOUS
  Administered 2020-09-09: 50 mg via INTRAVENOUS
  Administered 2020-09-09: 10 mg via INTRAVENOUS

## 2020-09-09 MED ORDER — CEPHALEXIN 500 MG PO CAPS: 500.0000 mg | ORAL_CAPSULE | Freq: Every day | ORAL | 0 refills | Status: DC

## 2020-09-09 SURGICAL SUPPLY — 34 items
BAG DRAIN CYSTO-URO LG1000N (MISCELLANEOUS) ×3 IMPLANT
BRUSH SCRUB EZ 1% IODOPHOR (MISCELLANEOUS) ×3 IMPLANT
CATH URET FLEX-TIP 2 LUMEN 10F (CATHETERS) ×3 IMPLANT
CATH URETL 5X70 OPEN END (CATHETERS) IMPLANT
CNTNR SPEC 2.5X3XGRAD LEK (MISCELLANEOUS)
CONT SPEC 4OZ STER OR WHT (MISCELLANEOUS)
CONT SPEC 4OZ STRL OR WHT (MISCELLANEOUS)
CONTAINER SPEC 2.5X3XGRAD LEK (MISCELLANEOUS) IMPLANT
DRAPE UTILITY 15X26 TOWEL STRL (DRAPES) ×3 IMPLANT
DRSG TEGADERM 2-3/8X2-3/4 SM (GAUZE/BANDAGES/DRESSINGS) ×3 IMPLANT
GLOVE BIOGEL PI IND STRL 7.5 (GLOVE) ×1 IMPLANT
GLOVE BIOGEL PI INDICATOR 7.5 (GLOVE) ×2
GOWN STRL REUS W/ TWL LRG LVL3 (GOWN DISPOSABLE) ×1 IMPLANT
GOWN STRL REUS W/ TWL XL LVL3 (GOWN DISPOSABLE) ×1 IMPLANT
GOWN STRL REUS W/TWL LRG LVL3 (GOWN DISPOSABLE) ×3
GOWN STRL REUS W/TWL XL LVL3 (GOWN DISPOSABLE) ×3
GUIDEWIRE STR DUAL SENSOR (WIRE) ×3 IMPLANT
INFUSOR MANOMETER BAG 3000ML (MISCELLANEOUS) ×3 IMPLANT
INTRODUCER DILATOR DOUBLE (INTRODUCER) IMPLANT
KIT TURNOVER CYSTO (KITS) ×3 IMPLANT
MANIFOLD NEPTUNE II (INSTRUMENTS) IMPLANT
PACK CYSTO AR (MISCELLANEOUS) ×3 IMPLANT
SET CYSTO W/LG BORE CLAMP LF (SET/KITS/TRAYS/PACK) ×3 IMPLANT
SHEATH URETERAL 12FRX35CM (MISCELLANEOUS) IMPLANT
SOL .9 NS 3000ML IRR  AL (IV SOLUTION) ×3
SOL .9 NS 3000ML IRR AL (IV SOLUTION) ×1
SOL .9 NS 3000ML IRR UROMATIC (IV SOLUTION) ×1 IMPLANT
STENT URET 6FRX24 CONTOUR (STENTS) IMPLANT
STENT URET 6FRX26 CONTOUR (STENTS) ×3 IMPLANT
SURGILUBE 2OZ TUBE FLIPTOP (MISCELLANEOUS) ×3 IMPLANT
SYR 10ML LL (SYRINGE) ×3 IMPLANT
TRACTIP FLEXIVA PULSE ID 200 (Laser) ×3 IMPLANT
VALVE UROSEAL ADJ ENDO (VALVE) ×3 IMPLANT
WATER STERILE IRR 1000ML POUR (IV SOLUTION) ×3 IMPLANT

## 2020-09-09 NOTE — Anesthesia Postprocedure Evaluation (Signed)
Anesthesia Post Note  Patient: Philip Cook  Procedure(s) Performed: CYSTOSCOPY/URETEROSCOPY/HOLMIUM LASER/STENT EXCHANGE (Right )  Patient location during evaluation: PACU Anesthesia Type: General Level of consciousness: awake and alert Pain management: pain level controlled Vital Signs Assessment: post-procedure vital signs reviewed and stable Respiratory status: spontaneous breathing, nonlabored ventilation, respiratory function stable and patient connected to nasal cannula oxygen Cardiovascular status: blood pressure returned to baseline and stable Postop Assessment: no apparent nausea or vomiting Anesthetic complications: no   No complications documented.   Last Vitals:  Vitals:   09/09/20 0850 09/09/20 0909  BP: (!) 123/52 (!) 150/73  Pulse: 76 70  Resp: 12 16  Temp: (!) 36.4 C (!) 36.3 C  SpO2: 94% 96%    Last Pain:  Vitals:   09/09/20 0909  TempSrc: Temporal  PainSc: 0-No pain                 [name]

## 2020-09-09 NOTE — Op Note (Signed)
Date of procedure: 09/09/20  Preoperative diagnosis:  1. Right proximal ureteral stone  Postoperative diagnosis:  1. Same  Procedure: 1. Cystoscopy, right ureteroscopy, laser lithotripsy, right retrograde pyelogram, right ureteral stent placement  Surgeon: Nickolas Madrid, MD  Anesthesia: General  Complications: None  Intraoperative findings:  1.  Uncomplicated right ureteroscopy and dusting of proximal ureteral stone with stent placement  EBL: Minimal  Specimens: None  Drains: Right 6 French by 26 cm ureteral stent  Indication: Philip Cook is a 59 y.o. patient that previously presented with a 8 mm right proximal ureteral stone and we were unable to access the collecting system with the ureteroscope and a stent was placed.  He presents today for definitive management.  After reviewing the management options for treatment, they elected to proceed with the above surgical procedure(s). We have discussed the potential benefits and risks of the procedure, side effects of the proposed treatment, the likelihood of the patient achieving the goals of the procedure, and any potential problems that might occur during the procedure or recuperation. Informed consent has been obtained.  Description of procedure:  The patient was taken to the operating room and general anesthesia was induced. SCDs were placed for DVT prophylaxis. The patient was placed in the dorsal lithotomy position, prepped and draped in the usual sterile fashion, and preoperative antibiotics were administered. A preoperative time-out was performed.   A 21 French rigid cystoscope was used to intubate the urethra and there was a subtle narrowing in the proximal bulbourethra but this was easily bypassed with the scope.  The prostate was moderate in size.  Cystoscopy showed no suspicious lesions.  The right ureteral stent was grasped and pulled to the meatus, and a sensor wire passed into the collecting system under fluoroscopic  vision.  A dual-lumen ureteral access catheter was advanced over the wire, and a second safety wire was added.  The single channel digital flexible ureteroscope was then advanced over the wire up to the level of the stone in the proximal ureter.  There is still a fair amount of ureteral edema where the stone was impacted.  A 242 m laser fiber was used to dust the stone on settings of 0.5 J and 40 Hz.  Thorough ureteroscopy and pyeloscopy showed no other residual fragments.  A retrograde pyelogram was performed from the proximal ureter that showed no extravasation or filling defects.  Careful pullback ureteroscopy demonstrated no residual fragments.  The rigid cystoscope was backloaded over the wire and a 6 [place] by 26 cm ureteral stent was uneventfully placed.  The bladder was drained and a belladonna suppository was placed and this concluded our procedure.  Disposition: Stable to PACU  Plan: Follow-up for stent removal in clinic in 1 to 2 weeks Keflex prophylaxis while stent in place  Nickolas Madrid, MD

## 2020-09-09 NOTE — Interval H&P Note (Signed)
UROLOGY H&P UPDATE  Agree with prior H&P dated 12/10.   Cardiac: RRR Lungs: CTA bilaterally  Laterality: RIGHT Procedure: Ureteroscopy, laser lithotripsy, stent placement  Urine: urine culture 12/10 no growth  We specifically discussed the risks ureteroscopy including bleeding, infection/sepsis, stent related symptoms including flank pain/urgency/frequency/incontinence/dysuria, ureteral injury, inability to access stone, or need for staged or additional procedures.   Billey Co, MD 09/09/2020

## 2020-09-09 NOTE — Transfer of Care (Signed)
Immediate Anesthesia Transfer of Care Note  Patient: Philip Cook  Procedure(s) Performed: CYSTOSCOPY/URETEROSCOPY/HOLMIUM LASER/STENT EXCHANGE (Right )  Patient Location: PACU  Anesthesia Type:General  Level of Consciousness: awake, drowsy and patient cooperative  Airway & Oxygen Therapy: Patient Spontanous Breathing and Patient connected to face mask oxygen  Post-op Assessment: Report given to RN  Post vital signs: Reviewed and stable  Last Vitals:  Vitals Value Taken Time  BP 121/51 09/09/20 0820  Temp 36.3 C 09/09/20 0820  Pulse 89 09/09/20 0820  Resp 19 09/09/20 0820  SpO2 99 % 09/09/20 0820    Last Pain:  Vitals:   09/09/20 0640  TempSrc: Oral  PainSc: 0-No pain         Complications: No complications documented.

## 2020-09-09 NOTE — Discharge Instructions (Signed)

## 2020-09-09 NOTE — Anesthesia Procedure Notes (Addendum)
Procedure Name: Intubation Performed by: Kelton Pillar, CRNA Pre-anesthesia Checklist: Patient identified, Emergency Drugs available, Suction available and Patient being monitored Patient Re-evaluated:Patient Re-evaluated prior to induction Oxygen Delivery Method: Circle system utilized Preoxygenation: Pre-oxygenation with 100% oxygen Induction Type: IV induction and Cricoid Pressure applied Ventilation: Mask ventilation without difficulty Laryngoscope Size: McGraph and 4 Grade View: Grade II Tube type: Oral Tube size: 7.5 mm Number of attempts: 1 Airway Equipment and Method: Stylet,  Oral airway,  Patient positioned with wedge pillow and Video-laryngoscopy Placement Confirmation: ETT inserted through vocal cords under direct vision,  positive ETCO2,  breath sounds checked- equal and bilateral and CO2 detector Secured at: 21 cm Tube secured with: Tape Dental Injury: Teeth and Oropharynx as per pre-operative assessment  Difficulty Due To: Difficulty was anticipated and Difficult Airway- due to large tongue Future Recommendations: Recommend- induction with short-acting agent, and alternative techniques readily available

## 2020-09-09 NOTE — Anesthesia Preprocedure Evaluation (Addendum)
Anesthesia Evaluation  Patient identified by MRN, date of birth, ID band Patient awake    Reviewed: Allergy & Precautions, H&P , NPO status , Patient's Chart, lab work & pertinent test results  Airway Mallampati: III  TM Distance: >3 FB     Dental no notable dental hx.    Pulmonary sleep apnea and Continuous Positive Airway Pressure Ventilation ,    Pulmonary exam normal        Cardiovascular Exercise Tolerance: Good hypertension, Pt. on medications + CAD, + Cardiac Stents and +CHF  Normal cardiovascular exam     Neuro/Psych negative neurological ROS  negative psych ROS   GI/Hepatic negative GI ROS, Neg liver ROS,   Endo/Other  negative endocrine ROSdiabetes, Well Controlled  Renal/GU negative Renal ROS  negative genitourinary   Musculoskeletal negative musculoskeletal ROS (+)   Abdominal Normal abdominal exam  (+)   Peds negative pediatric ROS (+)  Hematology negative hematology ROS (+)   Anesthesia Other Findings   Reproductive/Obstetrics negative OB ROS                                                             Anesthesia Evaluation  Patient identified by MRN, date of birth, ID band Patient awake    Reviewed: Allergy & Precautions, H&P , NPO status , reviewed documented beta blocker date and time   Airway Mallampati: III  TM Distance: >3 FB Neck ROM: full    Dental  (+) Teeth Intact   Pulmonary sleep apnea and Continuous Positive Airway Pressure Ventilation ,    Pulmonary exam normal        Cardiovascular hypertension, + CAD, + Cardiac Stents and +CHF  Normal cardiovascular exam  2018 Cath Conclusion Diagnostic cardiac catheter from right groin Normal overall left ventricular function of 60% Stent proximal LAD rather patient Diagonal 1 with moderate to severe disease Circumflex moderate disease RCA relatively free of disease and branch Recommend medical  therapy   Neuro/Psych negative neurological ROS  negative psych ROS   GI/Hepatic Neg liver ROS, neg GERD  ,  Endo/Other  diabetesMorbid obesity  Renal/GU stones  negative genitourinary   Musculoskeletal negative musculoskeletal ROS (+)   Abdominal   Peds negative pediatric ROS (+)  Hematology negative hematology ROS (+)   Anesthesia Other Findings Past Medical History: No date: CHF (congestive heart failure) (HCC) No date: Coronary artery disease No date: Diabetes mellitus without complication (HCC) No date: Hyperlipidemia No date: Hypertension  Past Surgical History: No date: CORONARY ANGIOPLASTY WITH STENT PLACEMENT 04/07/2017: LEFT HEART CATH AND CORONARY ANGIOGRAPHY; N/A     Comment:  Procedure: Left Heart Cath and Coronary Angiography and               possible PCI;  Surgeon: Yolonda Kida, MD;                Location: [facility];  Service: Cardiovascular;              Laterality: N/A;   Education re OSA and TIVA done  Reproductive/Obstetrics                             Anesthesia Physical  Anesthesia Plan  ASA: III  Anesthesia Plan: General  Post-op Pain Management:    Induction: Intravenous  PONV Risk Score and Plan:   Airway Management Planned: Oral ETT  Additional Equipment:   Intra-op Plan:   Post-operative Plan: Extubation in OR  Informed Consent: I have reviewed the patients History and Physical, chart, labs and discussed the procedure including the risks, benefits and alternatives for the proposed anesthesia with the patient or authorized representative who has indicated his/her understanding and acceptance.     Dental Advisory Given and Dental advisory given  Plan Discussed with: CRNA  Anesthesia Plan Comments:         Anesthesia Quick Evaluation  Anesthesia Physical Anesthesia Plan  ASA: III  Anesthesia Plan: General   Post-op Pain Management:    Induction:  Intravenous  PONV Risk Score and Plan: 3  Airway Management Planned: Oral ETT  Additional Equipment:   Intra-op Plan:   Post-operative Plan:   Informed Consent:   Plan Discussed with:   Anesthesia Plan Comments:         Anesthesia Quick Evaluation

## 2020-09-11 ENCOUNTER — Telehealth: Payer: Self-pay | Admitting: *Deleted

## 2020-09-11 MED ORDER — TAMSULOSIN HCL 0.4 MG PO CAPS: 0.4000 mg | ORAL_CAPSULE | Freq: Every day | ORAL | 0 refills | Status: DC

## 2020-09-11 NOTE — Telephone Encounter (Signed)
Patient called Triage line concerned of not feeling empty. He has been urinating with minimal pain. Has been taking oxybutynin as directed and Tylenol for pain. Requested refill of Flomax. Offered appointment to be evaluated, declined. RX sent to [facility].

## 2020-09-11 NOTE — Telephone Encounter (Signed)
Agree with refilling Flomax, he is likely having frequency secondary to the stent, and this will resolve once the stent is removed next week  Nickolas Madrid, MD 09/11/2020

## 2020-09-17 ENCOUNTER — Emergency Department: Payer: BC Managed Care – PPO

## 2020-09-17 ENCOUNTER — Observation Stay
Admission: EM | Admit: 2020-09-17 | Discharge: 2020-09-20 | Disposition: A | Discharge status: Home / Self Care | Payer: BC Managed Care – PPO | Attending: Hospitalist | Admitting: Hospitalist

## 2020-09-17 ENCOUNTER — Other Ambulatory Visit: Payer: Self-pay

## 2020-09-17 ENCOUNTER — Observation Stay: Payer: BC Managed Care – PPO

## 2020-09-17 ENCOUNTER — Encounter: Payer: Self-pay | Admitting: Emergency Medicine

## 2020-09-17 DIAGNOSIS — R0602 Shortness of breath: Secondary | ICD-10-CM | POA: Diagnosis present

## 2020-09-17 DIAGNOSIS — R339 Retention of urine, unspecified: Secondary | ICD-10-CM | POA: Diagnosis not present

## 2020-09-17 DIAGNOSIS — A419 Sepsis, unspecified organism: Secondary | ICD-10-CM | POA: Diagnosis not present

## 2020-09-17 DIAGNOSIS — R350 Frequency of micturition: Secondary | ICD-10-CM | POA: Diagnosis not present

## 2020-09-17 DIAGNOSIS — B9689 Other specified bacterial agents as the cause of diseases classified elsewhere: Secondary | ICD-10-CM | POA: Insufficient documentation

## 2020-09-17 DIAGNOSIS — E119 Type 2 diabetes mellitus without complications: Secondary | ICD-10-CM | POA: Diagnosis not present

## 2020-09-17 DIAGNOSIS — I110 Hypertensive heart disease with heart failure: Secondary | ICD-10-CM | POA: Diagnosis not present

## 2020-09-17 DIAGNOSIS — D72829 Elevated white blood cell count, unspecified: Secondary | ICD-10-CM

## 2020-09-17 DIAGNOSIS — Z794 Long term (current) use of insulin: Secondary | ICD-10-CM | POA: Insufficient documentation

## 2020-09-17 DIAGNOSIS — Z7982 Long term (current) use of aspirin: Secondary | ICD-10-CM | POA: Insufficient documentation

## 2020-09-17 DIAGNOSIS — N390 Urinary tract infection, site not specified: Secondary | ICD-10-CM | POA: Diagnosis not present

## 2020-09-17 DIAGNOSIS — I5033 Acute on chronic diastolic (congestive) heart failure: Secondary | ICD-10-CM | POA: Diagnosis not present

## 2020-09-17 DIAGNOSIS — I159 Secondary hypertension, unspecified: Secondary | ICD-10-CM

## 2020-09-17 DIAGNOSIS — Z79899 Other long term (current) drug therapy: Secondary | ICD-10-CM | POA: Insufficient documentation

## 2020-09-17 DIAGNOSIS — I2510 Atherosclerotic heart disease of native coronary artery without angina pectoris: Secondary | ICD-10-CM | POA: Insufficient documentation

## 2020-09-17 DIAGNOSIS — I10 Essential (primary) hypertension: Secondary | ICD-10-CM | POA: Diagnosis present

## 2020-09-17 DIAGNOSIS — Z955 Presence of coronary angioplasty implant and graft: Secondary | ICD-10-CM | POA: Insufficient documentation

## 2020-09-17 DIAGNOSIS — Z20822 Contact with and (suspected) exposure to covid-19: Secondary | ICD-10-CM | POA: Insufficient documentation

## 2020-09-17 DIAGNOSIS — I509 Heart failure, unspecified: Secondary | ICD-10-CM

## 2020-09-17 DIAGNOSIS — N401 Enlarged prostate with lower urinary tract symptoms: Secondary | ICD-10-CM | POA: Diagnosis not present

## 2020-09-17 LAB — COMPREHENSIVE METABOLIC PANEL
ALT: 25 U/L (ref 0–44)
AST: 30 U/L (ref 15–41)
Albumin: 4 g/dL (ref 3.5–5.0)
Alkaline Phosphatase: 86 U/L (ref 38–126)
Anion gap: 11 (ref 5–15)
BUN: 15 mg/dL (ref 6–20)
CO2: 27 mmol/L (ref 22–32)
Calcium: 9.4 mg/dL (ref 8.9–10.3)
Chloride: 101 mmol/L (ref 98–111)
Creatinine, Ser: 0.94 mg/dL (ref 0.61–1.24)
GFR, Estimated: >60 mL/min (ref >60)
Glucose, Bld: 160 mg/dL — ABNORMAL HIGH (ref 70–99)
Potassium: 3.9 mmol/L (ref 3.5–5.1)
Sodium: 139 mmol/L (ref 135–145)
Total Bilirubin: 1 mg/dL (ref 0.3–1.2)
Total Protein: 7.9 g/dL (ref 6.5–8.1)

## 2020-09-17 LAB — URINALYSIS, COMPLETE (UACMP) WITH MICROSCOPIC
Bacteria, UA: NONE SEEN
Bilirubin Urine: NEGATIVE
Glucose, UA: NEGATIVE mg/dL
Ketones, ur: NEGATIVE mg/dL
Nitrite: NEGATIVE
Protein, ur: 100 mg/dL — AB
RBC / HPF: >50 RBC/hpf — ABNORMAL HIGH (ref 0–5)
Specific Gravity, Urine: 1.009 (ref 1.005–1.030)
Squamous Epithelial / HPF: NONE SEEN (ref 0–5)
WBC, UA: >50 WBC/hpf — ABNORMAL HIGH (ref 0–5)
pH: 6 (ref 5.0–8.0)

## 2020-09-17 LAB — CBC WITH DIFFERENTIAL/PLATELET
Abs Immature Granulocytes: 0.08 K/uL — ABNORMAL HIGH (ref 0.00–0.07)
Basophils Absolute: 0.1 K/uL (ref 0.0–0.1)
Basophils Relative: 1 %
Eosinophils Absolute: 0.1 K/uL (ref 0.0–0.5)
Eosinophils Relative: 0 %
HCT: 44 % (ref 39.0–52.0)
Hemoglobin: 14.7 g/dL (ref 13.0–17.0)
Immature Granulocytes: 1 %
Lymphocytes Relative: 4 %
Lymphs Abs: 0.5 K/uL — ABNORMAL LOW (ref 0.7–4.0)
MCH: 31.3 pg (ref 26.0–34.0)
MCHC: 33.4 g/dL (ref 30.0–36.0)
MCV: 93.6 fL (ref 80.0–100.0)
Monocytes Absolute: 0.1 K/uL (ref 0.1–1.0)
Monocytes Relative: 1 %
Neutro Abs: 11.7 K/uL — ABNORMAL HIGH (ref 1.7–7.7)
Neutrophils Relative %: 93 %
Platelets: 153 K/uL (ref 150–400)
RBC: 4.7 MIL/uL (ref 4.22–5.81)
RDW: 12.7 % (ref 11.5–15.5)
WBC: 12.5 K/uL — ABNORMAL HIGH (ref 4.0–10.5)
nRBC: 0 % (ref 0.0–0.2)

## 2020-09-17 LAB — GLUCOSE, CAPILLARY: Glucose-Capillary: 291 mg/dL — ABNORMAL HIGH (ref 70–99)

## 2020-09-17 LAB — CBG MONITORING, ED: Glucose-Capillary: 276 mg/dL — ABNORMAL HIGH (ref 70–99)

## 2020-09-17 LAB — RESP PANEL BY RT-PCR (FLU A&B, COVID) ARPGX2
Influenza A by PCR: NEGATIVE
Influenza B by PCR: NEGATIVE
SARS Coronavirus 2 by RT PCR: NEGATIVE

## 2020-09-17 LAB — HIV ANTIBODY (ROUTINE TESTING W REFLEX): HIV Screen 4th Generation wRfx: NONREACTIVE

## 2020-09-17 LAB — BRAIN NATRIURETIC PEPTIDE: B Natriuretic Peptide: 57.2 pg/mL (ref 0.0–100.0)

## 2020-09-17 LAB — TROPONIN I (HIGH SENSITIVITY): Troponin I (High Sensitivity): 18 ng/L — ABNORMAL HIGH

## 2020-09-17 MED ORDER — SODIUM CHLORIDE 0.9 % IV SOLN: 250.0000 mL | INTRAVENOUS | Status: DC | PRN

## 2020-09-17 MED ORDER — POTASSIUM CHLORIDE CRYS ER 10 MEQ PO TBCR
10.0000 meq | EXTENDED_RELEASE_TABLET | Freq: Two times a day (BID) | ORAL | Status: DC
  Administered 2020-09-17 – 2020-09-20 (×7): 10 meq via ORAL
  Filled 2020-09-17 (×7): qty 1

## 2020-09-17 MED ORDER — TAMSULOSIN HCL 0.4 MG PO CAPS
0.4000 mg | ORAL_CAPSULE | Freq: Every day | ORAL | Status: DC
  Administered 2020-09-17 – 2020-09-19 (×3): 0.4 mg via ORAL
  Filled 2020-09-17 (×3): qty 1

## 2020-09-17 MED ORDER — ACETAMINOPHEN 325 MG PO TABS
650.0000 mg | ORAL_TABLET | ORAL | Status: DC | PRN
  Administered 2020-09-18 – 2020-09-19 (×5): 650 mg via ORAL
  Filled 2020-09-17 (×4): qty 2

## 2020-09-17 MED ORDER — ONDANSETRON HCL 4 MG/2ML IJ SOLN
4.0000 mg | Freq: Four times a day (QID) | INTRAMUSCULAR | Status: DC | PRN
  Administered 2020-09-17: 20:00:00 4 mg via INTRAVENOUS
  Filled 2020-09-17: qty 2

## 2020-09-17 MED ORDER — SODIUM CHLORIDE 0.9 % IV SOLN
1.0000 g | INTRAVENOUS | Status: DC
  Administered 2020-09-17 – 2020-09-19 (×3): 1 g via INTRAVENOUS
  Filled 2020-09-17 (×4): qty 10
  Filled 2020-09-17: qty 1

## 2020-09-17 MED ORDER — FUROSEMIDE 10 MG/ML IJ SOLN
40.0000 mg | Freq: Once | INTRAMUSCULAR | Status: AC
  Administered 2020-09-17: 10:00:00 40 mg via INTRAVENOUS
  Filled 2020-09-17: qty 4

## 2020-09-17 MED ORDER — NEBIVOLOL HCL 10 MG PO TABS
10.0000 mg | ORAL_TABLET | Freq: Every day | ORAL | Status: DC
  Administered 2020-09-17 – 2020-09-20 (×4): 10 mg via ORAL
  Filled 2020-09-17 (×4): qty 1

## 2020-09-17 MED ORDER — ROSUVASTATIN CALCIUM 10 MG PO TABS
10.0000 mg | ORAL_TABLET | Freq: Every evening | ORAL | Status: DC
  Administered 2020-09-17 – 2020-09-19 (×3): 10 mg via ORAL
  Filled 2020-09-17 (×3): qty 1

## 2020-09-17 MED ORDER — FUROSEMIDE 10 MG/ML IJ SOLN
40.0000 mg | Freq: Three times a day (TID) | INTRAMUSCULAR | Status: AC
  Administered 2020-09-17 – 2020-09-18 (×3): 40 mg via INTRAVENOUS
  Filled 2020-09-17 (×3): qty 4

## 2020-09-17 MED ORDER — LOSARTAN POTASSIUM 25 MG PO TABS
25.0000 mg | ORAL_TABLET | Freq: Every day | ORAL | Status: DC
  Administered 2020-09-17 – 2020-09-18 (×2): 25 mg via ORAL
  Filled 2020-09-17 (×2): qty 1

## 2020-09-17 MED ORDER — CHLORHEXIDINE GLUCONATE CLOTH 2 % EX PADS
6.0000 | MEDICATED_PAD | Freq: Every day | CUTANEOUS | Status: DC
  Administered 2020-09-18 – 2020-09-19 (×2): 6 via TOPICAL

## 2020-09-17 MED ORDER — SODIUM CHLORIDE 0.9% FLUSH
3.0000 mL | Freq: Two times a day (BID) | INTRAVENOUS | Status: DC
  Administered 2020-09-17 – 2020-09-19 (×6): 3 mL via INTRAVENOUS

## 2020-09-17 MED ORDER — ASPIRIN EC 325 MG PO TBEC
325.0000 mg | DELAYED_RELEASE_TABLET | Freq: Every day | ORAL | Status: DC
  Administered 2020-09-17 – 2020-09-20 (×4): 325 mg via ORAL
  Filled 2020-09-17 (×4): qty 1

## 2020-09-17 MED ORDER — ISOSORBIDE MONONITRATE ER 60 MG PO TB24
60.0000 mg | ORAL_TABLET | Freq: Every day | ORAL | Status: DC
  Administered 2020-09-17: 15:00:00 60 mg via ORAL
  Filled 2020-09-17: qty 1

## 2020-09-17 MED ORDER — ENOXAPARIN SODIUM 40 MG/0.4ML ~~LOC~~ SOLN
40.0000 mg | SUBCUTANEOUS | Status: DC
  Administered 2020-09-17 – 2020-09-18 (×2): 40 mg via SUBCUTANEOUS
  Filled 2020-09-17 (×2): qty 0.4

## 2020-09-17 MED ORDER — PRASUGREL HCL 10 MG PO TABS
10.0000 mg | ORAL_TABLET | Freq: Every day | ORAL | Status: DC
  Administered 2020-09-17 – 2020-09-20 (×4): 10 mg via ORAL
  Filled 2020-09-17 (×4): qty 1

## 2020-09-17 MED ORDER — INSULIN ASPART 100 UNIT/ML ~~LOC~~ SOLN
0.0000 [IU] | Freq: Three times a day (TID) | SUBCUTANEOUS | Status: DC
  Administered 2020-09-17: 17:00:00 11 [IU] via SUBCUTANEOUS
  Administered 2020-09-18: 13:00:00 20 [IU] via SUBCUTANEOUS
  Administered 2020-09-18: 09:00:00 15 [IU] via SUBCUTANEOUS
  Administered 2020-09-18 – 2020-09-19 (×2): 11 [IU] via SUBCUTANEOUS
  Administered 2020-09-19: 09:00:00 20 [IU] via SUBCUTANEOUS
  Administered 2020-09-19: 13:00:00 15 [IU] via SUBCUTANEOUS
  Administered 2020-09-20: 09:00:00 7 [IU] via SUBCUTANEOUS
  Filled 2020-09-17 (×8): qty 1

## 2020-09-17 MED ORDER — SODIUM CHLORIDE 0.9% FLUSH: 3.0000 mL | INTRAVENOUS | Status: DC | PRN

## 2020-09-17 MED ORDER — INSULIN ASPART 100 UNIT/ML ~~LOC~~ SOLN
5.0000 [IU] | Freq: Once | SUBCUTANEOUS | Status: AC
  Administered 2020-09-17: 22:00:00 5 [IU] via SUBCUTANEOUS
  Filled 2020-09-17: qty 1

## 2020-09-17 MED ORDER — ISOSORBIDE MONONITRATE ER 30 MG PO TB24
90.0000 mg | ORAL_TABLET | Freq: Every day | ORAL | Status: DC
  Administered 2020-09-18 – 2020-09-20 (×3): 90 mg via ORAL
  Filled 2020-09-17: qty 3
  Filled 2020-09-17: qty 1
  Filled 2020-09-17: qty 3

## 2020-09-17 MED ORDER — OXYBUTYNIN CHLORIDE ER 10 MG PO TB24
10.0000 mg | ORAL_TABLET | Freq: Every day | ORAL | Status: DC | PRN
Start: 2020-09-17 — End: 2020-09-20
  Filled 2020-09-17: qty 1

## 2020-09-17 MED ORDER — IOHEXOL 350 MG/ML SOLN
100.0000 mL | Freq: Once | INTRAVENOUS | Status: AC | PRN
  Administered 2020-09-17: 16:00:00 100 mL via INTRAVENOUS
  Filled 2020-09-17: qty 100

## 2020-09-17 MED ORDER — ACETAMINOPHEN 500 MG PO TABS: 1000.0000 mg | ORAL_TABLET | Freq: Four times a day (QID) | ORAL | Status: DC | PRN

## 2020-09-17 MED ORDER — MELOXICAM 7.5 MG PO TABS
15.0000 mg | ORAL_TABLET | Freq: Every day | ORAL | Status: DC
  Administered 2020-09-17 – 2020-09-19 (×3): 15 mg via ORAL
  Filled 2020-09-17 (×5): qty 2

## 2020-09-17 NOTE — ED Triage Notes (Signed)
Pt in from home via [facility] w/sob that woke him up in the middle of the night. Denies any cp, is having some bladder pain. Recent kidney stone w/lithotripsy, reporting frequent urine and no recent weight gain. Does have CHF and takes Lasix. sats 96% on RA, tachypneic on arrival. HR 120's.

## 2020-09-17 NOTE — ED Notes (Signed)
Patient states that he is in a rush to be seen. Advised patient that we have a full ED.

## 2020-09-17 NOTE — ED Provider Notes (Addendum)
[facility] Emergency Department Provider Note   ____________________________________________    I have reviewed the triage vital signs and the nursing notes.   HISTORY  Chief Complaint Shortness of Breath     HPI Philip Cook is a 59 y.o. male with history of CHF, CAD, diabetes who presents with complaints of shortness of breath.  Patient reports he woke up in the overnight with significant shortness of breath, yesterday he overall felt well.  He is having frequent urination related to a ureteral stent that he had placed after lithotripsy, this is due to be removed within the next couple of days.  Denies fevers chills or cough.  Reports positive weight gain positive compliance with meds.  Has had Covid vaccines plus booster  Past Medical History:  Diagnosis Date  . CHF (congestive heart failure) (HCC)   . Coronary artery disease   . Diabetes mellitus without complication (HCC)   . History of kidney stones   . Hx MRSA infection   . Hyperlipidemia   . Hypertension   . Kidney stone   . Sleep apnea     Patient Active Problem List   Diagnosis Date Noted  . CAD (coronary artery disease) 07/28/2017  . Chronic diastolic heart failure (HCC) 04/19/2017  . Diabetes (HCC) 04/19/2017  . HTN (hypertension) 04/19/2017  . Obstructive sleep apnea 04/19/2017  . S/P cardiac catheterization 04/19/2017  . Chest pain 04/05/2017    Past Surgical History:  Procedure Laterality Date  . CARPAL TUNNEL RELEASE Bilateral   . COLONOSCOPY WITH PROPOFOL N/A 02/08/2020   Procedure: COLONOSCOPY WITH PROPOFOL;  Surgeon: Toledo, Boykin Nearing, MD;  Location: [facility];  Service: Gastroenterology;  Laterality: N/A;  . CORONARY ANGIOPLASTY WITH STENT PLACEMENT    . CYSTOSCOPY W/ RETROGRADES  08/30/2020   Procedure: CYSTOSCOPY WITH RETROGRADE PYELOGRAM;  Surgeon: Sondra Come, MD;  Location: [facility];  Service: Urology;;  . CYSTOSCOPY/URETEROSCOPY/HOLMIUM  LASER/STENT PLACEMENT Right 08/30/2020   Procedure: CYSTOSCOPY/URETEROSCOPY/STENT PLACEMENT;  Surgeon: Sondra Come, MD;  Location: [facility];  Service: Urology;  Laterality: Right;  . CYSTOSCOPY/URETEROSCOPY/HOLMIUM LASER/STENT PLACEMENT Right 09/09/2020   Procedure: CYSTOSCOPY/URETEROSCOPY/HOLMIUM LASER/STENT EXCHANGE;  Surgeon: Sondra Come, MD;  Location: [facility];  Service: Urology;  Laterality: Right;  . LEFT HEART CATH AND CORONARY ANGIOGRAPHY N/A 04/07/2017   Procedure: Left Heart Cath and Coronary Angiography and possible PCI;  Surgeon: Alwyn Pea, MD;  Location: [facility];  Service: Cardiovascular;  Laterality: N/A;  . ROTATOR CUFF REPAIR Left     Prior to Admission medications   Medication Sig Start Date End Date Taking? Authorizing Provider  acetaminophen (TYLENOL) 500 MG tablet Take 1,000 mg by mouth every 6 (six) hours as needed for moderate pain.    [provider]  aspirin 325 MG tablet Take 325 mg by mouth daily.     [provider]  cephALEXin (KEFLEX) 500 MG capsule Take 1 capsule (500 mg total) by mouth daily for 14 days. 09/09/20 09/23/20  Sondra Come, MD  furosemide (LASIX) 20 MG tablet Take 1 tablet (20 mg total) by mouth 2 (two) times daily. Patient taking differently: Take 20 mg by mouth 2 (two) times daily. 04/07/17 02/08/20  Auburn Bilberry, MD  HUMULIN R 500 UNIT/ML injection Inject into the skin. Use in insulin pump 08/26/20   [provider]  Insulin Human (INSULIN PUMP) SOLN Inject into the skin.     [provider]  isosorbide mononitrate (IMDUR) 30 MG  24 hr tablet Take 2 tablets (60 mg total) daily by mouth. 07/29/17   Enedina FinnerPatel, Sona, MD  meloxicam (MOBIC) 15 MG tablet Take 15 mg by mouth at bedtime.    [provider]  Multiple Vitamin (MULTIVITAMIN WITH MINERALS) TABS tablet Take 1 tablet by mouth daily.    [provider]  nebivolol (BYSTOLIC) 10 MG tablet Take 10 mg by mouth daily.     [provider]  oxybutynin (DITROPAN XL) 10 MG 24 hr tablet Take 1 tablet (10 mg total) by mouth daily as needed (bladder spasm/stent pain). 08/30/20 08/30/21  Sondra ComeSninsky, Brian C, MD  prasugrel (EFFIENT) 10 MG TABS tablet Take 10 mg by mouth daily. 01/04/17   [provider]  rosuvastatin (CRESTOR) 10 MG tablet Take 1 tablet (10 mg total) by mouth daily. 04/08/17   Auburn BilberryPatel, Shreyang, MD  tamsulosin (FLOMAX) 0.4 MG CAPS capsule Take 1 capsule (0.4 mg total) by mouth daily after supper. 09/11/20   Sondra ComeSninsky, Brian C, MD     Allergies Trulicity [dulaglutide] and Sulfa antibiotics  Family History  Problem Relation Age of Onset  . CAD Father   . Heart failure Brother     Social History Social History   Tobacco Use  . Smoking status: Never Smoker  . Smokeless tobacco: Never Used  Vaping Use  . Vaping Use: Never used  Substance Use Topics  . Alcohol use: No  . Drug use: No    Review of Systems  Constitutional: No fever/chills Eyes: No visual changes.  ENT: No sore throat. Cardiovascular: Denies chest pain. Respiratory: As above Gastrointestinal: No abdominal pain.   Genitourinary: No dysuria, frequent urination Musculoskeletal: Negative for back pain. Skin: Negative for rash. Neurological: Negative for headaches or weakness   ____________________________________________   PHYSICAL EXAM:  VITAL SIGNS: ED Triage Vitals [09/17/20 0857]  Enc Vitals Group     BP (!) 153/79     Pulse Rate (!) 122     Resp (!) 27     Temp      Temp src      SpO2 95 %     Weight      Height      Head Circumference      Peak Flow      Pain Score      Pain Loc      Pain Edu?      Excl. in GC?     Constitutional: Alert and oriented. Eyes: Conjunctivae are normal.  Head: Atraumatic. Nose: No congestion/rhinnorhea.  Cardiovascular: Tachycardia, regular rhythm. Grossly normal heart sounds.  Good peripheral circulation. Respiratory: Increased respiratory effort with  tachypnea no retractions.  Bibasilar Rales gastrointestinal: Soft and nontender. No distention.  No CVA tenderness. Genitourinary: deferred Musculoskeletal: 2+ edema bilaterally, warm and well perfused Neurologic:  Normal speech and language. No gross focal neurologic deficits are appreciated.  Skin:  Skin is warm, dry and intact. No rash noted. Psychiatric: Mood and affect are normal. Speech and behavior are normal.  ____________________________________________   LABS (all labs ordered are listed, but only abnormal results are displayed)  Labs Reviewed  COMPREHENSIVE METABOLIC PANEL - Abnormal; Notable for the following components:      Result Value   Glucose, Bld 160 (*)    All other components within normal limits  URINALYSIS, COMPLETE (UACMP) WITH MICROSCOPIC - Abnormal; Notable for the following components:   Color, Urine AMBER (*)    APPearance CLOUDY (*)    Hgb urine dipstick LARGE (*)  Protein, ur 100 (*)    Leukocytes,Ua MODERATE (*)    RBC / HPF >50 (*)    WBC, UA >50 (*)    All other components within normal limits  TROPONIN I (HIGH SENSITIVITY) - Abnormal; Notable for the following components:   Troponin I (High Sensitivity) 18 (*)    All other components within normal limits  RESP PANEL BY RT-PCR (FLU A&B, COVID) ARPGX2  BRAIN NATRIURETIC PEPTIDE  CBC WITH DIFFERENTIAL/PLATELET  CBC WITH DIFFERENTIAL/PLATELET   ____________________________________________  EKG  ED ECG REPORT I, Jene Every, the attending physician, personally viewed and interpreted this ECG.  Date: 09/17/2020  Rhythm: Sinus tachycardia QRS Axis: normal Intervals: normal ST/T Wave abnormalities: normal Narrative Interpretation: no evidence of acute ischemia  ____________________________________________  RADIOLOGY  Chest x-ray viewed by me, consistent with edema ____________________________________________   PROCEDURES  Procedure(s) performed: No  Procedures   Critical  Care performed: No ____________________________________________   INITIAL IMPRESSION / ASSESSMENT AND PLAN / ED COURSE  Pertinent labs & imaging results that were available during my care of the patient were reviewed by me and considered in my medical decision making (see chart for details).  Patient presents with shortness of breath in the setting of history of CHF CAD, diabetes.  He is afebrile in the emergency department per RN  Development of shortness of breath while sleeping with positive weight gain, positive edema on chest x-ray suspicious for edema most consistent with CHF exacerbation.  He is tachycardic and tachypneic although not hypoxic.  He has had vaccines and booster, suspect CHF exacerbation as opposed to Covid given rapid onset of CHF and no other viral symptoms recently.  We will treat with IV Lasix, he will require admission to the hospital service  Discussed with Dr. Lonna Cobb of urology, patient will be seen in hospital for possible removal of ureteral stent, no contraindication to placing Foley catheter with ureteral stent    ____________________________________________   FINAL CLINICAL IMPRESSION(S) / ED DIAGNOSES  Final diagnoses:  Acute on chronic congestive heart failure, unspecified heart failure type [facility])        Note:  This document was prepared using Dragon voice recognition software and may include unintentional dictation errors.   Jene Every, MD 09/17/20 1005    Jene Every, MD 09/17/20 1010

## 2020-09-17 NOTE — H&P (Signed)
History and Physical    Philip Cook:[id number] DOB: Feb 23, 1961 DOA: 09/17/2020  PCP: Marguarite Arbour, MD (Confirm with patient/family/NH records and if not entered, this has to be entered at [facility] point of entry) Patient coming from: home  I have personally briefly reviewed patient's old medical records in [facility]  Chief Complaint: increased SOB  HPI: Philip Cook is a 59 y.o. male with medical history significant of  HFpEF, CAD, diabetes who presents with complaints of shortness of breath.  Patient reports he woke up in the night with hard shaking chills, racing heart and increased  shortness of breath. Yesterday he felt well.  He is having frequent urination related to a ureteral stent that he had placed after lithotripsy, this is due to be removed within the next couple of days, as well as BPH having missed several doses of Flomax. .  Reports weight gain positive and claims compliance with meds.  Has had Covid vaccines plus booster and a negative covid test 08/30/20. [facility] is called to admit for evaluation of symptoms and for treatment management.  ED Course: T 98.4,  104/73  HR 110 RR 13. Cmet with glucose of 160, Cr 0.94, BNP 57. CBC with WBC 12.5 with 93/4/1. U/A cloudy, moderate LE, > 50 RBC/hpf, > 50 WBC/hpf. CXR with mild interstial opacities bibasilar  Review of Systems: As per HPI otherwise 10 point review of systems negative.    Past Medical History:  Diagnosis Date  . CHF (congestive heart failure) (HCC)   . Coronary artery disease   . Diabetes mellitus without complication (HCC)   . History of kidney stones   . Hx MRSA infection   . Hyperlipidemia   . Hypertension   . Kidney stone   . Sleep apnea     Past Surgical History:  Procedure Laterality Date  . CARPAL TUNNEL RELEASE Bilateral   . COLONOSCOPY WITH PROPOFOL N/A 02/08/2020   Procedure: COLONOSCOPY WITH PROPOFOL;  Surgeon: Toledo, Boykin Nearing, MD;  Location: [facility] ENDOSCOPY;  Service:  Gastroenterology;  Laterality: N/A;  . CORONARY ANGIOPLASTY WITH STENT PLACEMENT    . CYSTOSCOPY W/ RETROGRADES  08/30/2020   Procedure: CYSTOSCOPY WITH RETROGRADE PYELOGRAM;  Surgeon: Sondra Come, MD;  Location: [facility] ORS;  Service: Urology;;  . CYSTOSCOPY/URETEROSCOPY/HOLMIUM LASER/STENT PLACEMENT Right 08/30/2020   Procedure: CYSTOSCOPY/URETEROSCOPY/STENT PLACEMENT;  Surgeon: Sondra Come, MD;  Location: [facility] ORS;  Service: Urology;  Laterality: Right;  . CYSTOSCOPY/URETEROSCOPY/HOLMIUM LASER/STENT PLACEMENT Right 09/09/2020   Procedure: CYSTOSCOPY/URETEROSCOPY/HOLMIUM LASER/STENT EXCHANGE;  Surgeon: Sondra Come, MD;  Location: [facility] ORS;  Service: Urology;  Laterality: Right;  . LEFT HEART CATH AND CORONARY ANGIOGRAPHY N/A 04/07/2017   Procedure: Left Heart Cath and Coronary Angiography and possible PCI;  Surgeon: Alwyn Pea, MD;  Location: [facility] INVASIVE CV LAB;  Service: Cardiovascular;  Laterality: N/A;  . ROTATOR CUFF REPAIR Left     Soc Hx - married 24 years. He has two daughters aged 68, 24. He just retired as Development worker, community at [employer] He enjoys woodworking   reports that he has never smoked. He has never used smokeless tobacco. He reports that he does not drink alcohol and does not use drugs.  Allergies  Allergen Reactions  . Trulicity [Dulaglutide] Nausea And Vomiting  . Sulfa Antibiotics Rash    Family History  Problem Relation Age of Onset  . CAD Father   . Heart failure Brother      Prior to Admission medications  Medication Sig Start Date End Date Taking? Authorizing Provider  aspirin 325 MG tablet Take 325 mg by mouth daily.    Yes [provider]  cephALEXin (KEFLEX) 500 MG capsule Take 1 capsule (500 mg total) by mouth daily for 14 days. 09/09/20 09/23/20 Yes Sondra Come, MD  furosemide (LASIX) 20 MG tablet Take 1 tablet (20 mg total) by mouth 2 (two) times daily. Patient taking differently: Take 20 mg by mouth 2  (two) times daily. 04/07/17 02/08/20 Yes Auburn Bilberry, MD  HUMULIN R 500 UNIT/ML injection Inject into the skin. Use in insulin pump 08/26/20  Yes [provider]  isosorbide mononitrate (IMDUR) 30 MG 24 hr tablet Take 2 tablets (60 mg total) daily by mouth. 07/29/17  Yes Enedina Finner, MD  meloxicam (MOBIC) 15 MG tablet Take 15 mg by mouth at bedtime.   Yes [provider]  Multiple Vitamin (MULTIVITAMIN WITH MINERALS) TABS tablet Take 1 tablet by mouth daily.   Yes [provider]  nebivolol (BYSTOLIC) 10 MG tablet Take 10 mg by mouth daily.   Yes [provider]  oxybutynin (DITROPAN XL) 10 MG 24 hr tablet Take 1 tablet (10 mg total) by mouth daily as needed (bladder spasm/stent pain). 08/30/20 08/30/21 Yes Sondra Come, MD  prasugrel (EFFIENT) 10 MG TABS tablet Take 10 mg by mouth daily. 01/04/17  Yes [provider]  rosuvastatin (CRESTOR) 10 MG tablet Take 1 tablet (10 mg total) by mouth daily. 04/08/17  Yes Auburn Bilberry, MD  tamsulosin (FLOMAX) 0.4 MG CAPS capsule Take 1 capsule (0.4 mg total) by mouth daily after supper. 09/11/20  Yes Sondra Come, MD  acetaminophen (TYLENOL) 500 MG tablet Take 1,000 mg by mouth every 6 (six) hours as needed for moderate pain.    [provider]  Insulin Human (INSULIN PUMP) SOLN Inject into the skin.     [provider]    Physical Exam: Vitals:   09/17/20 0930 09/17/20 1004 09/17/20 1030 09/17/20 1215  BP: (!) 143/58  (!) 142/66 104/73  Pulse: (!) 110  (!) 105 (!) 110  Resp: (!) 25  11 13   Temp:  98.4 F (36.9 C)    TempSrc:  Oral    SpO2: 95%  96% 95%     Vitals:   09/17/20 0930 09/17/20 1004 09/17/20 1030 09/17/20 1215  BP: (!) 143/58  (!) 142/66 104/73  Pulse: (!) 110  (!) 105 (!) 110  Resp: (!) 25  11 13   Temp:  98.4 F (36.9 C)    TempSrc:  Oral    SpO2: 95%  96% 95%   General:  Obese man in no acute distress, but worried. Eyes: PERRL, lids and conjunctivae  normal ENMT: Mucous membranes are moist. Posterior pharynx clear of any exudate or lesions.Normal dentition.  Neck: obese, supple, no masses, no thyromegaly Respiratory: clear to auscultation bilaterally, no wheezing, no crackles. Normal respiratory effort. No accessory muscle use.  Cardiovascular: Regular rate and rhythm, no murmurs / rubs / gallops. 2+ lower extremity edema. 1+ pedal pulses. No carotid bruits.  Abdomen: obese, no tenderness, no masses palpated. No hepatosplenomegaly. Bowel sounds positive.  Musculoskeletal: no clubbing / cyanosis. No joint deformity upper and lower extremities. Good ROM, no contractures. Normal muscle tone.  Skin: no rashes, lesions, ulcers. No induration Neurologic: CN 2-12 grossly intact. Strength 5/5 in all 4.  Psychiatric: Normal judgment and insight. Alert and oriented x 3. Normal mood.     Labs on Admission: I  have personally reviewed following labs and imaging studies  CBC: Recent Labs  Lab 09/17/20 1012  WBC 12.5*  NEUTROABS 11.7*  HGB 14.7  HCT 44.0  MCV 93.6  PLT 0000000   Basic Metabolic Panel: Recent Labs  Lab 09/17/20 0848  NA 139  K 3.9  CL 101  CO2 27  GLUCOSE 160*  BUN 15  CREATININE 0.94  CALCIUM 9.4   GFR: Estimated Creatinine Clearance: 128.3 mL/min (by C-G formula based on SCr of 0.94 mg/dL). Liver Function Tests: Recent Labs  Lab 09/17/20 0848  AST 30  ALT 25  ALKPHOS 86  BILITOT 1.0  PROT 7.9  ALBUMIN 4.0   No results for input(s): LIPASE, AMYLASE in the last 168 hours. No results for input(s): AMMONIA in the last 168 hours. Coagulation Profile: No results for input(s): INR, PROTIME in the last 168 hours. Cardiac Enzymes: No results for input(s): CKTOTAL, CKMB, CKMBINDEX, TROPONINI in the last 168 hours. BNP (last 3 results) No results for input(s): PROBNP in the last 8760 hours. HbA1C: No results for input(s): HGBA1C in the last 72 hours. CBG: No results for input(s): GLUCAP in the last 168  hours. Lipid Profile: No results for input(s): CHOL, HDL, LDLCALC, TRIG, CHOLHDL, LDLDIRECT in the last 72 hours. Thyroid Function Tests: No results for input(s): TSH, T4TOTAL, FREET4, T3FREE, THYROIDAB in the last 72 hours. Anemia Panel: No results for input(s): VITAMINB12, FOLATE, FERRITIN, TIBC, IRON, RETICCTPCT in the last 72 hours. Urine analysis:    Component Value Date/Time   COLORURINE AMBER (A) 09/17/2020 0848   APPEARANCEUR CLOUDY (A) 09/17/2020 0848   LABSPEC 1.009 09/17/2020 0848   PHURINE 6.0 09/17/2020 0848   GLUCOSEU NEGATIVE 09/17/2020 0848   HGBUR LARGE (A) 09/17/2020 0848   BILIRUBINUR NEGATIVE 09/17/2020 0848   KETONESUR NEGATIVE 09/17/2020 0848   PROTEINUR 100 (A) 09/17/2020 0848   NITRITE NEGATIVE 09/17/2020 0848   LEUKOCYTESUR MODERATE (A) 09/17/2020 0848    Radiological Exams on Admission: DG Chest Portable 1 View  Result Date: 09/17/2020 CLINICAL DATA:  Shortness of breath. EXAM: PORTABLE CHEST 1 VIEW COMPARISON:  Prior chest radiographs 07/28/2017 and earlier. FINDINGS: Heart size at the upper limits of normal. Mild interstitial opacities within the bilateral lung bases. No evidence of pleural effusion or pneumothorax. No acute bony abnormality identified. IMPRESSION: Mild interstitial opacities within the bilateral lung bases may reflect edema. Atypical/viral pneumonia cannot be excluded and clinical correlation is recommended. Electronically Signed   By: Kellie Simmering DO   On: 09/17/2020 08:59    EKG: Independently reviewed. Sinus tachycardia w/o acute changes  Assessment/Plan Active Problems:   Acute on chronic diastolic CHF (congestive heart failure) (HCC)   Leukocytosis   Diabetes (HCC)   HTN (hypertension)    1. Shortness of breath/acute on chronic diastolic heart failure - patient w/ h/o HFpEF that was reasonably well managed. His PCP increased isosorbide to 90 mg daily but this was reduce to 60 mg daily several weeks ago. His CXR and BNP do not  explain the degree of shortness of breath he experiences.  Plan Progressive unit admit  Oxygen support to keep O2 sat >90%  Continue home cardiac meds  Add losartan 25 mg daily  Return isosorbide dose to 90 mg daily  CTA to r/o PE - risk include post-urologic procedure, decreased activity, obesity  2D echo  2. Leukocytosis - patient with rigors overnight, leukocytosis to 12.6 with strong left shift, pyuria on U/A. Suspect complex UTI.  Plan Rocephin 1g IV q  24  F/u CBC  3. DM  Hold insulin pump in acute setting.  Plan Sliding scale coverage  4. HTN - continue home meds with addition of losartan as above  5. BPH - may contribute to slow stream, decreased UOP obs Plan Resume home meds  6. Urology - s/p intraureteral lithostripsy with residual stent  Plan Per urology.    DVT prophylaxis: lovenox Code Status: full code  Family Communication: Spoke with Tacy Learn, spouse. Explained Dx and TX plan. Answered all questions.  Disposition Plan: home when medically stable  Consults called: none Admission status: observation,cardiac progressive    Adella Hare MD [facility] Pager [phone]  If 7PM-7AM, please contact night-coverage [email or web address] Password [email or web address]  09/17/2020, 1:24 PM

## 2020-09-17 NOTE — BH Assessment (Signed)
TTS made contact with Philip Cook [phone]) who reports to be unsure if pt's referral was received and agreed to make contact with TTS later today to confirm or deny referral.

## 2020-09-18 ENCOUNTER — Encounter: Payer: BC Managed Care – PPO | Admitting: Urology

## 2020-09-18 ENCOUNTER — Observation Stay
Admit: 2020-09-18 | Discharge: 2020-09-18 | Disposition: A | Discharge status: Home / Self Care | Payer: BC Managed Care – PPO | Attending: Internal Medicine | Admitting: Internal Medicine

## 2020-09-18 DIAGNOSIS — N390 Urinary tract infection, site not specified: Secondary | ICD-10-CM

## 2020-09-18 DIAGNOSIS — A419 Sepsis, unspecified organism: Secondary | ICD-10-CM | POA: Diagnosis not present

## 2020-09-18 LAB — BASIC METABOLIC PANEL
Anion gap: 12 (ref 5–15)
BUN: 29 mg/dL — ABNORMAL HIGH (ref 6–20)
CO2: 28 mmol/L (ref 22–32)
Calcium: 8.7 mg/dL — ABNORMAL LOW (ref 8.9–10.3)
Chloride: 95 mmol/L — ABNORMAL LOW (ref 98–111)
Creatinine, Ser: 1.36 mg/dL — ABNORMAL HIGH (ref 0.61–1.24)
GFR, Estimated: >60 mL/min (ref >60)
Glucose, Bld: 299 mg/dL — ABNORMAL HIGH (ref 70–99)
Potassium: 4.2 mmol/L (ref 3.5–5.1)
Sodium: 135 mmol/L (ref 135–145)

## 2020-09-18 LAB — GLUCOSE, CAPILLARY
Glucose-Capillary: 315 mg/dL — ABNORMAL HIGH (ref 70–99)
Glucose-Capillary: 382 mg/dL — ABNORMAL HIGH (ref 70–99)
Glucose-Capillary: 287 mg/dL — ABNORMAL HIGH (ref 70–99)
Glucose-Capillary: 333 mg/dL — ABNORMAL HIGH (ref 70–99)

## 2020-09-18 LAB — ECHOCARDIOGRAM COMPLETE
Height: 71 in
S' Lateral: 3.24 cm
Weight: 5400.39 oz

## 2020-09-18 MED ORDER — INSULIN ASPART 100 UNIT/ML ~~LOC~~ SOLN
5.0000 [IU] | Freq: Three times a day (TID) | SUBCUTANEOUS | Status: DC
  Administered 2020-09-19 – 2020-09-20 (×4): 5 [IU] via SUBCUTANEOUS
  Filled 2020-09-18 (×5): qty 1

## 2020-09-18 MED ORDER — ENOXAPARIN SODIUM 80 MG/0.8ML ~~LOC~~ SOLN
0.5000 mg/kg | SUBCUTANEOUS | Status: DC
  Administered 2020-09-19: 13:00:00 77.5 mg via SUBCUTANEOUS
  Filled 2020-09-18 (×2): qty 0.8

## 2020-09-18 MED ORDER — INSULIN GLARGINE 100 UNIT/ML ~~LOC~~ SOLN
25.0000 [IU] | Freq: Two times a day (BID) | SUBCUTANEOUS | Status: DC
  Administered 2020-09-18 – 2020-09-20 (×4): 25 [IU] via SUBCUTANEOUS
  Filled 2020-09-18 (×5): qty 0.25

## 2020-09-18 NOTE — Progress Notes (Signed)
*  PRELIMINARY RESULTS* Echocardiogram 2D Echocardiogram has been performed.  Philip Cook 09/18/2020, 11:22 AM

## 2020-09-18 NOTE — Evaluation (Signed)
Occupational Therapy Evaluation Patient Details Name: Philip Cook MRN: [id number] DOB: April 28, 1961 Today's Date: 09/18/2020    History of Present Illness presented to ER secondary to SOB, shaking, palpitations and frequent urination; admitted for management of acute/chronic diastolic heart failure, complex UTI.   Clinical Impression   Philip Cook states he has a slight headache (2-3/10) this morning, but other than that is feeling much better than in previous few days. He performed bed mobility and transfers with increased time although no physical assistance or AD required. BUE strength and ROM WFL and symmetrical. Philip Cook is recently retired and expresses interest in beginning a diet and exercise routine, together with his wife, in the new year. Provided educ re: how to implement and continue with such a behavioral modification plan. Pt voiced understanding. Philip Cook reports no falls in previous 12 months, no challenges in navigating in his home or community. No further OT required at this time.     Follow Up Recommendations  No OT follow up    Equipment Recommendations  None recommended by OT    Recommendations for Other Services       Precautions / Restrictions Precautions Precautions: Fall Restrictions Weight Bearing Restrictions: No      Mobility Bed Mobility Overal bed mobility: Modified Independent             General bed mobility comments: requires increased time for bed mobility    Transfers Overall transfer level: Modified independent Equipment used: None             General transfer comment: requires increased time. Good control and balance    Balance Overall balance assessment: Modified Independent                                         ADL either performed or assessed with clinical judgement   ADL Overall ADL's : Modified independent                                       General ADL Comments:  requires sock aid for donning socks     Vision Baseline Vision/History: Wears glasses Wears Glasses: At all times Patient Visual Report: No change from baseline       Perception     Praxis      Pertinent Vitals/Pain Pain Assessment: No/denies pain     Hand Dominance     Extremity/Trunk Assessment Upper Extremity Assessment Upper Extremity Assessment: Overall WFL for tasks assessed   Lower Extremity Assessment Lower Extremity Assessment: Overall WFL for tasks assessed       Communication Communication Communication: No difficulties   Cognition Arousal/Alertness: Awake/alert Behavior During Therapy: WFL for tasks assessed/performed Overall Cognitive Status: Within Functional Limits for tasks assessed                                     General Comments  +2 edema, BLE    Exercises Other Exercises Other Exercises: Introduced/educated on importance of progressive mobility throughout remaining hospital stay (goal, 3x/day) and role of activity pacing.  Patient voiced understanding of all information.  Will enroll with mobility specialist to ensure continued mobility. Other Exercises: Provided educ re: AE for LB dressing/bathing. Educ on impt of mobility,  energy conservation, activity pacing, diet.   Shoulder Instructions      Home Living Family/patient expects to be discharged to:: Private residence Living Arrangements: Spouse/significant other;Children Available Help at Discharge: Family Type of Home: House Home Access: Stairs to enter Technical brewer of Steps: 4 Entrance Stairs-Rails: Left Home Layout: One level     Bathroom Shower/Tub: Occupational psychologist: Handicapped height Bathroom Accessibility: No   Home Equipment: None          Prior Functioning/Environment Level of Independence: Independent        Comments: Indep with ADLs, household and community mobilization without assist device; no home O2; no fall  history.  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        OT Problem List: Decreased strength;Impaired balance (sitting and/or standing);Decreased activity tolerance;Cardiopulmonary status limiting activity      OT Treatment/Interventions:      OT Goals(Current goals can be found in the care plan section) Acute Rehab OT Goals Patient Stated Goal: to return home OT Goal Formulation: With patient Time For Goal Achievement: 10/02/20 Potential to Achieve Goals: Good  OT Frequency:     Barriers to D/C:            Co-evaluation              AM-PAC OT "6 Clicks" Daily Activity     Outcome Measure Help from another person eating meals?: None Help from another person taking care of personal grooming?: None Help from another person toileting, which includes using toliet, bedpan, or urinal?: None Help from another person bathing (including washing, rinsing, drying)?: None Help from another person to put on and taking off regular upper body clothing?: None Help from another person to put on and taking off regular lower body clothing?: A Little 6 Click Score: 23   End of Session    Activity Tolerance: Patient tolerated treatment well Patient left: in chair;with call bell/phone within reach;with nursing/sitter in room  OT Visit Diagnosis: Unsteadiness on feet (R26.81);Muscle weakness (generalized) (M62.81);Other abnormalities of gait and mobility (R26.89)                Time: [id number] OT Time Calculation (min): 31 min Charges:  OT General Charges $OT Visit: 1 Visit OT Evaluation $OT Eval Low Complexity: 1 Low OT Treatments $Self Care/Home Management : 23-37 mins  Josiah Lobo, PhD, MS, OTR/L ascom [phone] 09/18/20, 10:36 AM

## 2020-09-18 NOTE — Evaluation (Signed)
Physical Therapy Evaluation Patient Details Name: Philip Cook MRN: [id number] DOB: 1961-03-28 Today's Date: 09/18/2020   History of Present Illness  presented to ER secondary to SOB, shaking, palpitations and frequent urination; admitted for management of acute/chronic diastolic heart failure, complex UTI.  Clinical Impression  Upon evaluation, patient alert and oriented; follows commands and eager for OOB activities.  Denies pain and demonstrates bilat UE/LE strength and ROM grossly symmetrical and WFL.  LEs generally edematous (+3 pitting), but no focal weakness appreciated.  Able to complete sit/stand, basic transfers and gait (200') without assist device, mod indep/indep.  Demonstrates reciprocal stepping pattern with good step height/length; good cadence (10' walk time, 7 seconds) and overall stability.  No buckling or LOB; mod SOB with exertion, sats 92-93% on RA Appears to be at/near baseline level of functional ability; no skilled PT needs identified at this time.  Will complete initial order, but will enroll with mobility specialists to ensure progressive mobility throughout remaining stay.  Please re-consult should rehab needs change.    Follow Up Recommendations No PT follow up    Equipment Recommendations       Recommendations for Other Services       Precautions / Restrictions Precautions Precautions: Fall;None Restrictions Weight Bearing Restrictions: No      Mobility  Bed Mobility               General bed mobility comments: seated in recliner beginning/end of treatment session    Transfers Overall transfer level: Independent Equipment used: None             General transfer comment: good LE strenght/control  Ambulation/Gait Ambulation/Gait assistance: Modified independent (Device/Increase time) Gait Distance (Feet): 200 Feet Assistive device: None   Gait velocity: 10' walk time, 7 seconds   General Gait Details: reciprocal stepping  pattern with good step height/length; good cadence and overall stability.  No buckling or LOB; mod SOB with exertion, sats 92-93% on RA  Stairs            Wheelchair Mobility    Modified Rankin (Stroke Patients Only)       Balance Overall balance assessment: Modified Independent                                           Pertinent Vitals/Pain Pain Assessment: No/denies pain    Home Living Family/patient expects to be discharged to:: Private residence Living Arrangements: Spouse/significant other;Children Available Help at Discharge: Family Type of Home: House Home Access: Stairs to enter Entrance Stairs-Rails: Left Entrance Stairs-Number of Steps: 4 Home Layout: One level Home Equipment: None      Prior Function Level of Independence: Independent         Comments: Indep with ADLs, household and community mobilization without assist device; no home O2; no fall history.  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     Hand Dominance        Extremity/Trunk Assessment   Upper Extremity Assessment Upper Extremity Assessment: Overall WFL for tasks assessed    Lower Extremity Assessment Lower Extremity Assessment: Overall WFL for tasks assessed (generally edematous throughout bilat LEs)       Communication   Communication: No difficulties  Cognition Arousal/Alertness: Awake/alert Behavior During Therapy: WFL for tasks assessed/performed Overall Cognitive Status: Within Functional Limits for tasks assessed  General Comments      Exercises Other Exercises Other Exercises: Introduced/educated on importance of progressive mobility throughout remaining hospital stay (goal, 3x/day) and role of activity pacing.  Patient voiced understanding of all information.  Will enroll with mobility specialist to ensure continued mobility.    Assessment/Plan    PT Assessment Patent does not need any further PT services  PT Problem List         PT Treatment Interventions      PT Goals (Current goals can be found in the Care Plan section)  Acute Rehab PT Goals Patient Stated Goal: to return home PT Goal Formulation: All assessment and education complete, DC therapy Time For Goal Achievement: 09/18/20 Potential to Achieve Goals: Good    Frequency     Barriers to discharge        Co-evaluation               AM-PAC PT "6 Clicks" Mobility  Outcome Measure Help needed turning from your back to your side while in a flat bed without using bedrails?: None Help needed moving from lying on your back to sitting on the side of a flat bed without using bedrails?: None Help needed moving to and from a bed to a chair (including a wheelchair)?: None Help needed standing up from a chair using your arms (e.g., wheelchair or bedside chair)?: None Help needed to walk in hospital room?: None Help needed climbing 3-5 steps with a railing? : None 6 Click Score: 24    End of Session   Activity Tolerance: Patient tolerated treatment well Patient left: in chair;with call bell/phone within reach Nurse Communication: Mobility status PT Visit Diagnosis: Difficulty in walking, not elsewhere classified (R26.2)    Time: [id number] PT Time Calculation (min) (ACUTE ONLY): 12 min   Charges:   PT Evaluation $PT Eval Moderate Complexity: 1 Mod         Narek Kniss H. Owens Shark, PT, DPT, NCS 09/18/20, 10:28 AM [phone]

## 2020-09-18 NOTE — Progress Notes (Signed)
Mobility Specialist - Progress Note   09/18/20 1500  Mobility  Activity Ambulated in hall  Level of Assistance Modified independent, requires aide device or extra time  Assistive Device None  Distance Ambulated (ft) 300 ft  Mobility Response Tolerated well  Mobility performed by Mobility specialist  $Mobility charge 1 Mobility    Pre-mobility: 76 HR, 95% SpO2 Post-mobility: 86 HR, 96% SpO2   Mobility was cleared by nursing to ambulate with pt this date. Pt was sitting in recliner upon arrival utilizing room air. Pt agreed to session. Pt denied pain, nausea, or fatigue. Pt was able to stand to RW with increased time. No dizziness upon standing. Pt ambulated 300' in room and hallway with no LOB noted. No buckling. Pt denied SOB or weakness. Upon return to recliner, pt c/o feeling a little tired with mild pain in BLE "3/10". Overall, pt tolerated session well. Pt was left in bed with all needs in reach. Nurse notified.    Filiberto Pinks Mobility Specialist 09/18/20, 3:53 PM

## 2020-09-18 NOTE — Progress Notes (Signed)
PROGRESS NOTE    Philip Cook  [id number] DOB: 09-08-1961 DOA: 09/17/2020 PCP: [name], MD    Assessment & Plan:   Active Problems:   Diabetes (HCC)   HTN (hypertension)   Acute on chronic diastolic CHF (congestive heart failure) (HCC)   Leukocytosis   Acute on chronic diastolic (congestive) heart failure (HCC)   Philip Cook is a 59 y.o. male with medical history significant of HFpEF, CAD, diabetes who presented with complaints of shortness of breath. Patient reported he woke up in the night with hard shaking chills, racing heart and increased  shortness of breath.   He was having frequent urination related to a ureteral stent that he had placed after lithotripsy.Reported weight gain positive and claimed compliance with meds.   Sepsis 2/2 UTI --presented with tachycardia, leukocytosis, source of infection urine. --started on ceftriaxone on presentation.  Urine cx not obtained on presentation. Plan: --cont ceftriaxone --add-on urine cx to initial urine collection  Shortness of breath --likely due to sepsis.  Does not appear to be in CHF exacerbation.  BNP 59.  No hypoxia.  CTA neg for PE or acute process.  Chronic diastolic CHF --cont home Imdur and nebivolol --d/c losartan 25 mg daily (new added on admission) due to low BP  3. DM2, poorly controlled --recent A1c 8.3.    --insulin pump removed on presentation Plan: --start Lantus 25u BID --mealtime 5u TID --SSI TID  4. HTN  --BP varied widely. --cont home Imdur and nebivolol --d/c losartan 25 mg daily (new added on admission) due to low BP  5. BPH Urinary retention, POA --Pt had difficulty voiding on presentation, so Foley was inserted.  Pt contributed it to missing a few days of Flomax Plan: --cont Foley for now --cont home Flomax  6. Urology - s/p intraureteral lithostripsy with residual stent --Pt and wife explained that stent removal will not be done while inpatient due to  acute urinary infection.  AKI, not POA --Cr 0.94 on presentation, went up to 1.36 next day, likely due to aggressive diuresis ordered on admission. --Hold further diuresis   DVT prophylaxis: Lovenox SQ Code Status: Full code  Family Communication: wife updated at bedside today Status is: observation Dispo:   The patient is from: home Anticipated d/c is to: home Anticipated d/c date is: 2-3 days Patient currently is not medically stable to d/c due to: complicated UTI on IV abx waiting on urine cx results.   Subjective and Interval History:  Pt reported feeling much better, no more chills and dyspnea at rest.  Still had more dyspnea on exertion and weakness.  Normal oral intake.  Has been having hematuria since stent was placed.   Objective: Vitals:   09/18/20 1245 09/18/20 1250 09/18/20 1608 09/18/20 1750  BP: (!) 109/48 (!) 106/44 (!) 89/43 (!) 106/41  Pulse: 81 83 75 82  Resp: 18  18   Temp: 100.1 F (37.8 C)  98.6 F (37 C)   TempSrc:   Oral   SpO2: 95%  97% 96%  Weight:      Height:        Intake/Output Summary (Last 24 hours) at 09/18/2020 1836 Last data filed at 09/18/2020 1036 Gross per 24 hour  Intake 240 ml  Output 1200 ml  Net -960 ml   Filed Weights   09/17/20 2013 09/18/20 0500  Weight: (!) 153.1 kg (!) 153.1 kg    Examination:   Constitutional: NAD, AAOx3, obese HEENT: conjunctivae and lids normal,  EOMI CV: No cyanosis.   RESP: normal respiratory effort, on RA Extremities: No effusions, edema in BLE SKIN: warm, dry and intact Neuro: II - XII grossly intact.   Psych: Normal mood and affect.  Appropriate judgement and reason Foley present, pink urine.   Data Reviewed: I have personally reviewed following labs and imaging studies  CBC: Recent Labs  Lab 09/17/20 1012  WBC 12.5*  NEUTROABS 11.7*  HGB 14.7  HCT 44.0  MCV 93.6  PLT 0000000   Basic Metabolic Panel: Recent Labs  Lab 09/17/20 0848 09/18/20 0550  NA 139 135  K 3.9 4.2  CL  101 95*  CO2 27 28  GLUCOSE 160* 299*  BUN 15 29*  CREATININE 0.94 1.36*  CALCIUM 9.4 8.7*   GFR: Estimated Creatinine Clearance: 90.2 mL/min (A) (by C-G formula based on SCr of 1.36 mg/dL (H)). Liver Function Tests: Recent Labs  Lab 09/17/20 0848  AST 30  ALT 25  ALKPHOS 86  BILITOT 1.0  PROT 7.9  ALBUMIN 4.0   No results for input(s): LIPASE, AMYLASE in the last 168 hours. No results for input(s): AMMONIA in the last 168 hours. Coagulation Profile: No results for input(s): INR, PROTIME in the last 168 hours. Cardiac Enzymes: No results for input(s): CKTOTAL, CKMB, CKMBINDEX, TROPONINI in the last 168 hours. BNP (last 3 results) No results for input(s): PROBNP in the last 8760 hours. HbA1C: No results for input(s): HGBA1C in the last 72 hours. CBG: Recent Labs  Lab 09/17/20 1659 09/17/20 2101 09/18/20 0820 09/18/20 1140 09/18/20 1611  GLUCAP 276* 291* 315* 382* 287*   Lipid Profile: No results for input(s): CHOL, HDL, LDLCALC, TRIG, CHOLHDL, LDLDIRECT in the last 72 hours. Thyroid Function Tests: No results for input(s): TSH, T4TOTAL, FREET4, T3FREE, THYROIDAB in the last 72 hours. Anemia Panel: No results for input(s): VITAMINB12, FOLATE, FERRITIN, TIBC, IRON, RETICCTPCT in the last 72 hours. Sepsis Labs: No results for input(s): PROCALCITON, LATICACIDVEN in the last 168 hours.  Recent Results (from the past 240 hour(s))  Resp Panel by RT-PCR (Flu A&B, Covid) Nasopharyngeal Swab     Status: None   Collection Time: 09/17/20 12:14 PM   Specimen: Nasopharyngeal Swab; Nasopharyngeal(NP) swabs in vial transport medium  Result Value Ref Range Status   SARS Coronavirus 2 by RT PCR NEGATIVE NEGATIVE Final    Comment: (NOTE) SARS-CoV-2 target nucleic acids are NOT DETECTED.  The SARS-CoV-2 RNA is generally detectable in upper respiratory specimens during the acute phase of infection. The lowest concentration of SARS-CoV-2 viral copies this assay can detect is 138  copies/mL. A negative result does not preclude SARS-Cov-2 infection and should not be used as the sole basis for treatment or other patient management decisions. A negative result may occur with  improper specimen collection/handling, submission of specimen other than nasopharyngeal swab, presence of viral mutation(s) within the areas targeted by this assay, and inadequate number of viral copies(<138 copies/mL). A negative result must be combined with clinical observations, patient history, and epidemiological information. The expected result is Negative.  Fact Sheet for Patients:  EntrepreneurPulse.com.au  Fact Sheet for Healthcare Providers:  IncredibleEmployment.be  This test is no t yet approved or cleared by the [place] FDA and  has been authorized for detection and/or diagnosis of SARS-CoV-2 by FDA under an Emergency Use Authorization (EUA). This EUA will remain  in effect (meaning this test can be used) for the duration of the COVID-19 declaration under Section 564(b)(1) of the Act, 21 U.S.C.section 360bbb-3(b)(1), unless  the authorization is terminated  or revoked sooner.       Influenza A by PCR NEGATIVE NEGATIVE Final   Influenza B by PCR NEGATIVE NEGATIVE Final    Comment: (NOTE) The Xpert Xpress SARS-CoV-2/FLU/RSV plus assay is intended as an aid in the diagnosis of influenza from Nasopharyngeal swab specimens and should not be used as a sole basis for treatment. Nasal washings and aspirates are unacceptable for Xpert Xpress SARS-CoV-2/FLU/RSV testing.  Fact Sheet for Patients: BloggerCourse.com  Fact Sheet for Healthcare Providers: SeriousBroker.it  This test is not yet approved or cleared by the [place] FDA and has been authorized for detection and/or diagnosis of SARS-CoV-2 by FDA under an Emergency Use Authorization (EUA). This EUA will remain in effect (meaning  this test can be used) for the duration of the COVID-19 declaration under Section 564(b)(1) of the Act, 21 U.S.C. section 360bbb-3(b)(1), unless the authorization is terminated or revoked.  Performed at [facility], [address] [place], [place] [address]       Radiology Studies: CT ANGIO CHEST PE W OR WO CONTRAST  Result Date: 09/17/2020 CLINICAL DATA:  Shortness of breath, polyuria, recent lithotripsy EXAM: CT ANGIOGRAPHY CHEST WITH CONTRAST TECHNIQUE: Multidetector CT imaging of the chest was performed using the standard protocol during bolus administration of intravenous contrast. Multiplanar CT image reconstructions and MIPs were obtained to evaluate the vascular anatomy. CONTRAST:  OMNIPAQUE IOHEXOL 350 MG/ML SOLN COMPARISON:  09/17/2020 FINDINGS: Cardiovascular: This is a technically adequate evaluation of the pulmonary vasculature. There are no filling defects or pulmonary emboli. The heart is mildly enlarged without pericardial effusion. Significant atherosclerosis within the LAD distribution of the coronary vasculature. The aorta is normal in caliber with minimal atherosclerosis of the aortic arch. No dissection. Mediastinum/Nodes: No enlarged mediastinal, hilar, or axillary lymph nodes. Thyroid gland, trachea, and esophagus demonstrate no significant findings. Lungs/Pleura: No acute airspace disease, effusion, or pneumothorax. Minimal hypoventilatory changes at the lung bases. Central airways are patent. Upper Abdomen: No acute abnormality. Musculoskeletal: No acute or destructive bony lesions. Reconstructed images demonstrate no additional findings. Review of the MIP images confirms the above findings. IMPRESSION: 1. No evidence of pulmonary embolus. 2. No acute intrathoracic process. 3. Mild cardiomegaly. 4. Aortic Atherosclerosis (ICD10-I70.0). Coronary artery atherosclerosis. Electronically Signed   By: Sharlet Salina M.D.   On: 09/17/2020 16:22   DG Chest Portable  1 View  Result Date: 09/17/2020 CLINICAL DATA:  Shortness of breath. EXAM: PORTABLE CHEST 1 VIEW COMPARISON:  Prior chest radiographs 07/28/2017 and earlier. FINDINGS: Heart size at the upper limits of normal. Mild interstitial opacities within the bilateral lung bases. No evidence of pleural effusion or pneumothorax. No acute bony abnormality identified. IMPRESSION: Mild interstitial opacities within the bilateral lung bases may reflect edema. Atypical/viral pneumonia cannot be excluded and clinical correlation is recommended. Electronically Signed   By: Jackey Loge DO   On: 09/17/2020 08:59   ECHOCARDIOGRAM COMPLETE  Result Date: 09/18/2020    ECHOCARDIOGRAM REPORT   Patient Name:   Philip Cook Date of Exam: 09/18/2020 Medical Rec #:  [id number]        Height:       71.0 in Accession #:    [id number]       Weight:       337.5 lb Date of Birth:  February 27, 1961        BSA:          2.634 m Patient Age:    35 years  BP:           141/65 mmHg Patient Gender: M                HR:           83 bpm. Exam Location:  [facility] Procedure: 2D Echo, Color Doppler and Cardiac Doppler Indications:     CHF-acute diastolic [id number]  History:         Patient has no prior history of Echocardiogram examinations.                  CHF; Risk Factors:Hypertension and Diabetes.  Sonographer:     Sherrie Sport RDCS (AE) Referring Phys:  [place] Diagnosing Phys: Bartholome Bill MD  Sonographer Comments: No apical window, no subcostal window and Technically challenging study due to limited acoustic windows. IMPRESSIONS  1. Left ventricular ejection fraction, by estimation, is 65 to 70%. The left ventricle has normal function. Left ventricular endocardial border not optimally defined to evaluate regional wall motion. There is mild left ventricular hypertrophy. Left ventricular diastolic function could not be evaluated.  2. Right ventricular systolic function is normal. The right ventricular size is mildly enlarged.  3. Left  atrial size was mildly dilated.  4. The mitral valve was not well visualized. No evidence of mitral valve regurgitation.  5. The aortic valve was not well visualized. Aortic valve regurgitation is not visualized. FINDINGS  Left Ventricle: Left ventricular ejection fraction, by estimation, is 65 to 70%. The left ventricle has normal function. Left ventricular endocardial border not optimally defined to evaluate regional wall motion. The left ventricular internal cavity size was normal in size. There is mild left ventricular hypertrophy. Left ventricular diastolic function could not be evaluated. Right Ventricle: The right ventricular size is mildly enlarged. No increase in right ventricular wall thickness. Right ventricular systolic function is normal. Left Atrium: Left atrial size was mildly dilated. Right Atrium: Right atrial size was normal in size. Pericardium: There is no evidence of pericardial effusion. Mitral Valve: The mitral valve was not well visualized. No evidence of mitral valve regurgitation. Tricuspid Valve: The tricuspid valve is not well visualized. Tricuspid valve regurgitation is trivial. Aortic Valve: The aortic valve was not well visualized. Aortic valve regurgitation is not visualized. Pulmonic Valve: The pulmonic valve was not assessed. Pulmonic valve regurgitation is not visualized. Aorta: The aortic root was not well visualized and the aortic root is normal in size and structure. IAS/Shunts: The interatrial septum was not assessed.  LEFT VENTRICLE PLAX 2D LVIDd:         5.08 cm LVIDs:         3.24 cm LV PW:         1.61 cm LV IVS:        1.46 cm LVOT diam:     2.30 cm LVOT Area:     4.15 cm  LEFT ATRIUM         Index LA diam:    4.50 cm 1.71 cm/m                        PULMONIC VALVE AORTA                 PV Vmax:        0.90 m/s Ao Root diam: 3.60 cm PV Peak grad:   3.2 mmHg  RVOT Peak grad: 4 mmHg   SHUNTS Systemic Diam: 2.30 cm Bartholome Bill MD Electronically signed  by Bartholome Bill MD Signature Date/Time: 09/18/2020/12:26:11 PM    Final      Scheduled Meds: . aspirin EC  325 mg Oral Daily  . Chlorhexidine Gluconate Cloth  6 each Topical Daily  . [START ON 09/19/2020] enoxaparin (LOVENOX) injection  0.5 mg/kg Subcutaneous Q24H  . insulin aspart  0-20 Units Subcutaneous TID WC  . insulin aspart  5 Units Subcutaneous TID WC  . insulin glargine  25 Units Subcutaneous BID  . isosorbide mononitrate  90 mg Oral Daily  . losartan  25 mg Oral Daily  . meloxicam  15 mg Oral QHS  . nebivolol  10 mg Oral Daily  . potassium chloride  10 mEq Oral BID  . prasugrel  10 mg Oral Daily  . rosuvastatin  10 mg Oral QPM  . sodium chloride flush  3 mL Intravenous Q12H  . tamsulosin  0.4 mg Oral QPC supper   Continuous Infusions: . sodium chloride    . cefTRIAXone (ROCEPHIN)  IV 1 g (09/18/20 1450)     LOS: 0 days     Enzo Bi, MD [facility] If 7PM-7AM, please contact night-coverage 09/18/2020, 6:36 PM

## 2020-09-18 NOTE — Progress Notes (Signed)
PHARMACIST - PHYSICIAN COMMUNICATION  CONCERNING:  Enoxaparin (Lovenox) for DVT Prophylaxis    RECOMMENDATION: Patient was prescribed enoxaprin 40mg  q24 hours for VTE prophylaxis.   Filed Weights   09/17/20 2013 09/18/20 0500  Weight: (!) 153.1 kg (337 lb 8.4 oz) (!) 153.1 kg (337 lb 8.4 oz)    Body mass index is 47.08 kg/m.  Estimated Creatinine Clearance: 90.2 mL/min (A) (by C-G formula based on SCr of 1.36 mg/dL (H)).   Based on [facility] policy patient is candidate for enoxaparin 0.5mg /kg TBW SQ every 24 hours based on BMI being >30.  DESCRIPTION: Pharmacy has adjusted enoxaparin dose per [facility] policy.  Patient is now receiving enoxaparin 77.5 mg every 24 hours    [facility], PharmD, BCPS Clinical Pharmacist  09/18/2020 3:16 PM

## 2020-09-18 NOTE — Progress Notes (Signed)
Pt arrived to floor from PCU. Stable on room air.

## 2020-09-18 NOTE — Progress Notes (Signed)
Inpatient Diabetes Program Recommendations  AACE/ADA: New Consensus Statement on Inpatient Glycemic Control  Target Ranges:  Prepandial:   less than 140 mg/dL      Peak postprandial:   less than 180 mg/dL (1-2 hours)      Critically ill patients:  140 - 180 mg/dL  Results for ROGERS, DITTER (MRN [id number]) as of 09/18/2020 08:43  Ref. Range 09/17/2020 16:59 09/17/2020 21:01 09/18/2020 08:20  Glucose-Capillary Latest Ref Range: 70 - 99 mg/dL 979 (H) 892 (H) 119 (H)    Review of Glycemic Control  Diabetes history: DM2 Outpatient Diabetes medications: Medtronic insulin pump with Humulin R U500 insulin Current orders for Inpatient glycemic control: Novolog 0-20 units TID with meals  Inpatient Diabetes Program Recommendations:    Insulin: Please consider ordering Lantus 25 units BID, Novolog 0-5 units QHS for bedtime correction, and Novolog 5 units TID with meals for meal coverage if patient eats at least 50% of meals. Marland Kitchen   NOTE: Spoke with patient over the phone regarding insulin pump and DM control. Patient uses Medtronic insulin pump with Humulin R U500 insulin outpatient. Patient reports that he removed his insulin pump yesterday in the Emergency Department prior to getting CT scan. Patient states confirms that he is followed by Dr. Tedd Sias and last seen Dr. Tedd Sias 06/14/20 and no insulin pump setting adjustments have been made since his last office visit with Dr. Tedd Sias. Per office visit note on 06/14/20 by Dr. Tedd Sias the following should be insulin pump settings:  Basal settings 12 am 0.575 units/hr 5 am 1.6 units/hr  9 am 1.05 units/hr  4 pm 1.55 units/hr  5 pm 2.5 units/hr 24-hr basal = 36.55 "syringe units" of U-500 insulin  Bolus settings Insulin to Carb ratio  12am 1:7.8 grams (1 unit covers 7.8 grams of carbs) 6pm 1:3.5 grams (1 unit covers 3.5 grams of carbs) Sensitivity 1:28 (1 unit drops glucose 28 mg/dl) Target 417-408 mg/dl  Patient states that over the past week his  glucose has ranged from 50-240 mg/dl and notes average glucose 100-180 mg/dl usually. Patient reports that he has had 2 episodes of hypoglycemia over the past month and he was able to recognize symptoms of hypoglycemia and treat them himself.  Informed patient that it would be requested that attending provider order basal insulin since insulin pump is off. Patient notes that he has an appointment with Dr. Tedd Sias for September 24, 2020.  Patient verbalized understanding of information and states he has no questions at this time related to DM.  Thanks, Orlando Penner, RN, MSN, CDE Diabetes Coordinator Inpatient Diabetes Program [phone] (Team Pager from 8am to 5pm)

## 2020-09-19 DIAGNOSIS — N390 Urinary tract infection, site not specified: Secondary | ICD-10-CM | POA: Diagnosis not present

## 2020-09-19 DIAGNOSIS — A419 Sepsis, unspecified organism: Secondary | ICD-10-CM | POA: Diagnosis not present

## 2020-09-19 LAB — MAGNESIUM: Magnesium: 2.1 mg/dL (ref 1.7–2.4)

## 2020-09-19 LAB — CBC
HCT: 37.1 % — ABNORMAL LOW (ref 39.0–52.0)
Hemoglobin: 12.2 g/dL — ABNORMAL LOW (ref 13.0–17.0)
MCH: 31 pg (ref 26.0–34.0)
MCHC: 32.9 g/dL (ref 30.0–36.0)
MCV: 94.2 fL (ref 80.0–100.0)
Platelets: 106 10*3/uL — ABNORMAL LOW (ref 150–400)
RBC: 3.94 MIL/uL — ABNORMAL LOW (ref 4.22–5.81)
RDW: 13.1 % (ref 11.5–15.5)
WBC: 9.2 10*3/uL (ref 4.0–10.5)
nRBC: 0 % (ref 0.0–0.2)

## 2020-09-19 LAB — BASIC METABOLIC PANEL
Anion gap: 10 (ref 5–15)
BUN: 26 mg/dL — ABNORMAL HIGH (ref 6–20)
CO2: 26 mmol/L (ref 22–32)
Calcium: 8.5 mg/dL — ABNORMAL LOW (ref 8.9–10.3)
Chloride: 97 mmol/L — ABNORMAL LOW (ref 98–111)
Creatinine, Ser: 1.1 mg/dL (ref 0.61–1.24)
GFR, Estimated: >60 mL/min (ref >60)
Glucose, Bld: 354 mg/dL — ABNORMAL HIGH (ref 70–99)
Potassium: 3.9 mmol/L (ref 3.5–5.1)
Sodium: 133 mmol/L — ABNORMAL LOW (ref 135–145)

## 2020-09-19 LAB — GLUCOSE, CAPILLARY
Glucose-Capillary: 375 mg/dL — ABNORMAL HIGH (ref 70–99)
Glucose-Capillary: 318 mg/dL — ABNORMAL HIGH (ref 70–99)
Glucose-Capillary: 295 mg/dL — ABNORMAL HIGH (ref 70–99)
Glucose-Capillary: 372 mg/dL — ABNORMAL HIGH (ref 70–99)

## 2020-09-19 NOTE — Progress Notes (Signed)
PROGRESS NOTE    KHRISTOPHER GONNERMAN  [id number] DOB: 07-06-1961 DOA: 09/17/2020 PCP: [name], MD    Assessment & Plan:   Active Problems:   Diabetes (HCC)   HTN (hypertension)   Acute on chronic diastolic CHF (congestive heart failure) (HCC)   Leukocytosis   Acute on chronic diastolic (congestive) heart failure (HCC)   RAHMEL HASBROUCK is a 59 y.o. male with medical history significant of HFpEF, CAD, diabetes who presented with complaints of shortness of breath. Patient reported he woke up in the night with hard shaking chills, racing heart and increased  shortness of breath.   He was having frequent urination related to a ureteral stent that he had placed after lithotripsy.Reported weight gain positive and claimed compliance with meds.   Sepsis 2/2 UTI --presented with tachycardia, leukocytosis, source of infection urine. --started on ceftriaxone on presentation.  Urine cx not obtained on presentation, but added on later to original collection. Plan: --cont ceftriaxone pending urine cx results  Shortness of breath, resolved CHF exacerbation ruled out --likely due to sepsis.  Does not appear to be in CHF exacerbation.  BNP 59.  No hypoxia.  CTA neg for PE or acute process.  Pt was ordered IV lasix 40 mg x4 on admission which caused AKI, suggesting pt was not fluid overloaded.  Chronic diastolic CHF --cont home Imdur and nebivolol  DM2, poorly controlled --recent A1c 8.3.    --insulin pump removed on presentation Plan: --cont Lantus 25u BID --mealtime 5u TID --SSI TID  4. HTN  --BP varied widely. --cont home Imdur and nebivolol  5. BPH Urinary retention, POA --Pt had difficulty voiding on presentation, so Foley was inserted.  Pt contributed it to missing a few days of Flomax Plan: --cont Foley for now --cont home Flomax --will remove Foley tomorrow  6. Urology - s/p intraureteral lithostripsy with residual stent --Explained to Pt and wife that  stent removal will not be done while inpatient due to acute urinary infection. --outpatient followup with urology for stent removal  AKI, not POA --Cr 0.94 on presentation, went up to 1.36 next day, likely due to aggressive diuresis ordered on admission. --Hold further diuresis   DVT prophylaxis: Lovenox SQ Code Status: Full code  Family Communication: wife updated at bedside today Status is: observation Dispo:   The patient is from: home Anticipated d/c is to: home Anticipated d/c date is: 1-2 days Patient currently is not medically stable to d/c due to: complicated UTI on IV abx waiting on urine cx results.   Subjective and Interval History:  Pt reported feeling better.  Urine less bloody.  Having BM's.     Objective: Vitals:   09/19/20 0434 09/19/20 0837 09/19/20 1216 09/19/20 1500  BP: 127/66 124/78 (!) 107/55 (!) 107/55  Pulse: 74 67 66 92  Resp: 16 16 16    Temp: 98.1 F (36.7 C) 97.8 F (36.6 C) 98.1 F (36.7 C) 99.4 F (37.4 C)  TempSrc: Oral Oral  Oral  SpO2: 95% 96% 95%   Weight:      Height:        Intake/Output Summary (Last 24 hours) at 09/19/2020 1707 Last data filed at 09/19/2020 1300 Gross per 24 hour  Intake 340 ml  Output 1945 ml  Net -1605 ml   Filed Weights   09/17/20 2013 09/18/20 0500  Weight: (!) 153.1 kg (!) 153.1 kg    Examination:   Constitutional: NAD, AAOx3 HEENT: conjunctivae and lids normal, EOMI CV: No cyanosis.  RESP: normal respiratory effort, on RA Extremities: No effusions, edema in BLE SKIN: warm, dry and intact Neuro: II - XII grossly intact.   Psych: Normal mood and affect.  Appropriate judgement and reason Foley present, urine less pink   Data Reviewed: I have personally reviewed following labs and imaging studies  CBC: Recent Labs  Lab 09/17/20 1012 09/19/20 0526  WBC 12.5* 9.2  NEUTROABS 11.7*  --   HGB 14.7 12.2*  HCT 44.0 37.1*  MCV 93.6 94.2  PLT 153 106*   Basic Metabolic Panel: Recent Labs   Lab 09/17/20 0848 09/18/20 0550 09/19/20 0526  NA 139 135 133*  K 3.9 4.2 3.9  CL 101 95* 97*  CO2 27 28 26   GLUCOSE 160* 299* 354*  BUN 15 29* 26*  CREATININE 0.94 1.36* 1.10  CALCIUM 9.4 8.7* 8.5*  MG  --   --  2.1   GFR: Estimated Creatinine Clearance: 111.5 mL/min (by C-G formula based on SCr of 1.1 mg/dL). Liver Function Tests: Recent Labs  Lab 09/17/20 0848  AST 30  ALT 25  ALKPHOS 86  BILITOT 1.0  PROT 7.9  ALBUMIN 4.0   No results for input(s): LIPASE, AMYLASE in the last 168 hours. No results for input(s): AMMONIA in the last 168 hours. Coagulation Profile: No results for input(s): INR, PROTIME in the last 168 hours. Cardiac Enzymes: No results for input(s): CKTOTAL, CKMB, CKMBINDEX, TROPONINI in the last 168 hours. BNP (last 3 results) No results for input(s): PROBNP in the last 8760 hours. HbA1C: No results for input(s): HGBA1C in the last 72 hours. CBG: Recent Labs  Lab 09/18/20 1611 09/18/20 2022 09/19/20 0839 09/19/20 1143 09/19/20 1620  GLUCAP 287* 333* 375* 318* 295*   Lipid Profile: No results for input(s): CHOL, HDL, LDLCALC, TRIG, CHOLHDL, LDLDIRECT in the last 72 hours. Thyroid Function Tests: No results for input(s): TSH, T4TOTAL, FREET4, T3FREE, THYROIDAB in the last 72 hours. Anemia Panel: No results for input(s): VITAMINB12, FOLATE, FERRITIN, TIBC, IRON, RETICCTPCT in the last 72 hours. Sepsis Labs: No results for input(s): PROCALCITON, LATICACIDVEN in the last 168 hours.  Recent Results (from the past 240 hour(s))  Urine Culture     Status: Abnormal (Preliminary result)   Collection Time: 09/17/20  8:48 AM   Specimen: Urine, Random  Result Value Ref Range Status   Specimen Description   Final    URINE, RANDOM Performed at [facility], [address], [place], [place]    Special Requests   Final    NONE Performed at [facility], [address], [place], [place]    Culture >=100,000  COLONIES/mL SERRATIA MARCESCENS (A)  Final   Report Status PENDING  Incomplete  Resp Panel by RT-PCR (Flu A&B, Covid) Nasopharyngeal Swab     Status: None   Collection Time: 09/17/20 12:14 PM   Specimen: Nasopharyngeal Swab; Nasopharyngeal(NP) swabs in vial transport medium  Result Value Ref Range Status   SARS Coronavirus 2 by RT PCR NEGATIVE NEGATIVE Final    Comment: (NOTE) SARS-CoV-2 target nucleic acids are NOT DETECTED.  The SARS-CoV-2 RNA is generally detectable in upper respiratory specimens during the acute phase of infection. The lowest concentration of SARS-CoV-2 viral copies this assay can detect is 138 copies/mL. A negative result does not preclude SARS-Cov-2 infection and should not be used as the sole basis for treatment or other patient management decisions. A negative result may occur with  improper specimen collection/handling, submission of specimen other than  nasopharyngeal swab, presence of viral mutation(s) within the areas targeted by this assay, and inadequate number of viral copies(<138 copies/mL). A negative result must be combined with clinical observations, patient history, and epidemiological information. The expected result is Negative.  Fact Sheet for Patients:  EntrepreneurPulse.com.au  Fact Sheet for Healthcare Providers:  IncredibleEmployment.be  This test is no t yet approved or cleared by the [place] FDA and  has been authorized for detection and/or diagnosis of SARS-CoV-2 by FDA under an Emergency Use Authorization (EUA). This EUA will remain  in effect (meaning this test can be used) for the duration of the COVID-19 declaration under Section 564(b)(1) of the Act, 21 U.S.C.section 360bbb-3(b)(1), unless the authorization is terminated  or revoked sooner.       Influenza A by PCR NEGATIVE NEGATIVE Final   Influenza B by PCR NEGATIVE NEGATIVE Final    Comment: (NOTE) The Xpert Xpress  SARS-CoV-2/FLU/RSV plus assay is intended as an aid in the diagnosis of influenza from Nasopharyngeal swab specimens and should not be used as a sole basis for treatment. Nasal washings and aspirates are unacceptable for Xpert Xpress SARS-CoV-2/FLU/RSV testing.  Fact Sheet for Patients: EntrepreneurPulse.com.au  Fact Sheet for Healthcare Providers: IncredibleEmployment.be  This test is not yet approved or cleared by the [place] FDA and has been authorized for detection and/or diagnosis of SARS-CoV-2 by FDA under an Emergency Use Authorization (EUA). This EUA will remain in effect (meaning this test can be used) for the duration of the COVID-19 declaration under Section 564(b)(1) of the Act, 21 U.S.C. section 360bbb-3(b)(1), unless the authorization is terminated or revoked.  Performed at [facility], [address] [place], [place] [address]       Radiology Studies: ECHOCARDIOGRAM COMPLETE  Result Date: 09/18/2020    ECHOCARDIOGRAM REPORT   Patient Name:   MIKI HARTNER Date of Exam: 09/18/2020 Medical Rec #:  [id number]        Height:       71.0 in Accession #:    [id number]       Weight:       337.5 lb Date of Birth:  Oct 23, 1960        BSA:          2.634 m Patient Age:    89 years         BP:           141/65 mmHg Patient Gender: M                HR:           83 bpm. Exam Location:  [facility] Procedure: 2D Echo, Color Doppler and Cardiac Doppler Indications:     CHF-acute diastolic [id number]  History:         Patient has no prior history of Echocardiogram examinations.                  CHF; Risk Factors:Hypertension and Diabetes.  Sonographer:     Sherrie Sport RDCS (AE) Referring Phys:  [place] Diagnosing Phys: Bartholome Bill MD  Sonographer Comments: No apical window, no subcostal window and Technically challenging study due to limited acoustic windows. IMPRESSIONS  1. Left ventricular ejection fraction, by estimation, is 65  to 70%. The left ventricle has normal function. Left ventricular endocardial border not optimally defined to evaluate regional wall motion. There is mild left ventricular hypertrophy. Left ventricular diastolic function could not be evaluated.  2. Right ventricular systolic function is  normal. The right ventricular size is mildly enlarged.  3. Left atrial size was mildly dilated.  4. The mitral valve was not well visualized. No evidence of mitral valve regurgitation.  5. The aortic valve was not well visualized. Aortic valve regurgitation is not visualized. FINDINGS  Left Ventricle: Left ventricular ejection fraction, by estimation, is 65 to 70%. The left ventricle has normal function. Left ventricular endocardial border not optimally defined to evaluate regional wall motion. The left ventricular internal cavity size was normal in size. There is mild left ventricular hypertrophy. Left ventricular diastolic function could not be evaluated. Right Ventricle: The right ventricular size is mildly enlarged. No increase in right ventricular wall thickness. Right ventricular systolic function is normal. Left Atrium: Left atrial size was mildly dilated. Right Atrium: Right atrial size was normal in size. Pericardium: There is no evidence of pericardial effusion. Mitral Valve: The mitral valve was not well visualized. No evidence of mitral valve regurgitation. Tricuspid Valve: The tricuspid valve is not well visualized. Tricuspid valve regurgitation is trivial. Aortic Valve: The aortic valve was not well visualized. Aortic valve regurgitation is not visualized. Pulmonic Valve: The pulmonic valve was not assessed. Pulmonic valve regurgitation is not visualized. Aorta: The aortic root was not well visualized and the aortic root is normal in size and structure. IAS/Shunts: The interatrial septum was not assessed.  LEFT VENTRICLE PLAX 2D LVIDd:         5.08 cm LVIDs:         3.24 cm LV PW:         1.61 cm LV IVS:        1.46 cm  LVOT diam:     2.30 cm LVOT Area:     4.15 cm  LEFT ATRIUM         Index LA diam:    4.50 cm 1.71 cm/m                        PULMONIC VALVE AORTA                 PV Vmax:        0.90 m/s Ao Root diam: 3.60 cm PV Peak grad:   3.2 mmHg                       RVOT Peak grad: 4 mmHg   SHUNTS Systemic Diam: 2.30 cm Bartholome Bill MD Electronically signed by Bartholome Bill MD Signature Date/Time: 09/18/2020/12:26:11 PM    Final      Scheduled Meds: . aspirin EC  325 mg Oral Daily  . Chlorhexidine Gluconate Cloth  6 each Topical Daily  . enoxaparin (LOVENOX) injection  0.5 mg/kg Subcutaneous Q24H  . insulin aspart  0-20 Units Subcutaneous TID WC  . insulin aspart  5 Units Subcutaneous TID WC  . insulin glargine  25 Units Subcutaneous BID  . isosorbide mononitrate  90 mg Oral Daily  . meloxicam  15 mg Oral QHS  . nebivolol  10 mg Oral Daily  . potassium chloride  10 mEq Oral BID  . prasugrel  10 mg Oral Daily  . rosuvastatin  10 mg Oral QPM  . sodium chloride flush  3 mL Intravenous Q12H  . tamsulosin  0.4 mg Oral QPC supper   Continuous Infusions: . sodium chloride    . cefTRIAXone (ROCEPHIN)  IV 1 g (09/19/20 1238)     LOS: 0 days     Otila Kluver  Billie Ruddy, MD [facility] If 7PM-7AM, please contact night-coverage 09/19/2020, 5:07 PM

## 2020-09-20 DIAGNOSIS — A419 Sepsis, unspecified organism: Secondary | ICD-10-CM | POA: Diagnosis not present

## 2020-09-20 DIAGNOSIS — N390 Urinary tract infection, site not specified: Secondary | ICD-10-CM | POA: Diagnosis not present

## 2020-09-20 LAB — BASIC METABOLIC PANEL
Anion gap: 9 (ref 5–15)
BUN: 19 mg/dL (ref 6–20)
CO2: 28 mmol/L (ref 22–32)
Calcium: 9 mg/dL (ref 8.9–10.3)
Chloride: 100 mmol/L (ref 98–111)
Creatinine, Ser: 1.1 mg/dL (ref 0.61–1.24)
GFR, Estimated: >60 mL/min (ref >60)
Glucose, Bld: 245 mg/dL — ABNORMAL HIGH (ref 70–99)
Potassium: 4.2 mmol/L (ref 3.5–5.1)
Sodium: 137 mmol/L (ref 135–145)

## 2020-09-20 LAB — CBC
HCT: 36.6 % — ABNORMAL LOW (ref 39.0–52.0)
Hemoglobin: 12.3 g/dL — ABNORMAL LOW (ref 13.0–17.0)
MCH: 31.4 pg (ref 26.0–34.0)
MCHC: 33.6 g/dL (ref 30.0–36.0)
MCV: 93.4 fL (ref 80.0–100.0)
Platelets: 114 10*3/uL — ABNORMAL LOW (ref 150–400)
RBC: 3.92 MIL/uL — ABNORMAL LOW (ref 4.22–5.81)
RDW: 12.6 % (ref 11.5–15.5)
WBC: 8.9 10*3/uL (ref 4.0–10.5)
nRBC: 0 % (ref 0.0–0.2)

## 2020-09-20 LAB — URINE CULTURE: Culture: >=100000 — AB

## 2020-09-20 LAB — GLUCOSE, CAPILLARY: Glucose-Capillary: 225 mg/dL — ABNORMAL HIGH (ref 70–99)

## 2020-09-20 LAB — MAGNESIUM: Magnesium: 2 mg/dL (ref 1.7–2.4)

## 2020-09-20 MED ORDER — MELOXICAM 15 MG PO TABS: 15.0000 mg | ORAL_TABLET | Freq: Every evening | ORAL | Status: DC | PRN

## 2020-09-20 MED ORDER — CIPROFLOXACIN HCL 500 MG PO TABS: 500.0000 mg | ORAL_TABLET | Freq: Two times a day (BID) | ORAL | Status: DC

## 2020-09-20 MED ORDER — SODIUM CHLORIDE 0.9 % IV SOLN
1.0000 g | INTRAVENOUS | Status: DC
  Filled 2020-09-20: qty 10

## 2020-09-20 MED ORDER — CIPROFLOXACIN HCL 500 MG PO TABS: 500.0000 mg | ORAL_TABLET | Freq: Two times a day (BID) | ORAL | 0 refills | Status: AC

## 2020-09-20 MED ORDER — ASPIRIN 325 MG PO TABS: ORAL_TABLET | ORAL | Status: DC

## 2020-09-20 NOTE — Discharge Summary (Signed)
Physician Discharge Summary   Philip Cook  male DOB: 1961/05/08  [id number]  PCP: [name], MD  Admit date: 09/17/2020 Discharge date: 09/20/2020  Admitted From: home Disposition:  home CODE STATUS: Full code  Discharge Instructions    Discharge instructions   Complete by: As directed    You have received 4 days of IV antibiotic for your urinary track infection.  Please continue 6 more days of Cipro as directed after discharge.  You are on aspirin 325 and Effient which are 2 similar blood thinners.  Please discuss with your outpatient provider about possibly stopping or reducing the aspirin.  Please follow up with your outpatient urology for stent removal.   Dr. Enzo Bi [facility] Course:  For full details, please see H&P, progress notes, consult notes and ancillary notes.  Briefly,  Dev Bilderback Hunteris a 59 y.o.malewith medical history significant ofHFpEF, CAD, diabetes who presented with complaints of shortness of breath. Patient reported he woke up in the night withhard shaking chills, racing heart and increasedshortness of breath.   He was having frequent urination related to a ureteral stent that he had placed after lithotripsy.Reported weight gain positiveand claimedcompliance with meds.   Sepsis 2/2 UTI presented with tachycardia, leukocytosis, source of infection urine.  Pt was started on ceftriaxone on presentation.  Urine cx grew Serratia marcescens.  Based on cx sensitivities, pt was discharged on 6 more days of Cipro.  Shortness of breath likely due to sepsis, resolved CHF exacerbation ruled out Did not appear to be in CHF exacerbation.  BNP 59.  No hypoxia.  CTA neg for PE or acute process.  Pt was ordered IV lasix 40 mg x4 on admission which caused AKI, suggesting pt was not fluid overloaded.  Chronic diastolic CHF Continued home Imdur and nebivolol  DM2, poorly controlled recent A1c 8.3.  Pt's insulin pump was  removed on presentation.  Pt received Lantus 25u BID, mealtime 5u TID, and SSI while inpatient.  Pt was discharged on home insulin pump and regimen.  HTN  BP varied widely.  Continued home Imdur and nebivolol  BPH Urinary retention, POA Pt had difficulty voiding on presentation, so Foley was inserted.  Pt contributed it to missing a few days of Flomax.  Flomax resumed and Foley was removed prior to discharge.  Recent intraureteral lithostripsy with residual stent outpatient followup with urology for stent removal after pt has been properly treated for his UTI.  AKI, not POA Cr 0.94 on presentation, went up to 1.36 next day, likely due to aggressive diuresis ordered on admission.  Further diuresis held.  Cr improved to 1.1 prior to discharge.   Discharge Diagnoses:  Active Problems:   Diabetes ([place])   HTN (hypertension)   Acute on chronic diastolic CHF (congestive heart failure) (HCC)   Leukocytosis   Acute on chronic diastolic (congestive) heart failure [facility])    Discharge Instructions:  Allergies as of 09/20/2020      Reactions   Trulicity [dulaglutide] Nausea And Vomiting   Sulfa Antibiotics Rash      Medication List    STOP taking these medications   cephALEXin 500 MG capsule Commonly known as: KEFLEX     TAKE these medications   acetaminophen 500 MG tablet Commonly known as: TYLENOL Take 1,000 mg by mouth every 6 (six) hours as needed for moderate pain.   aspirin 325 MG tablet Discuss with your outpatient provider about reducing or stopping this,  since you are already on Effient which is a similar blood thinner. What changed:   how much to take  how to take this  when to take this  additional instructions   ciprofloxacin 500 MG tablet Commonly known as: CIPRO Take 1 tablet (500 mg total) by mouth 2 (two) times daily for 6 days. Antibiotic.   furosemide 20 MG tablet Commonly known as: Lasix Take 1 tablet (20 mg total) by mouth 2 (two) times  daily.   HUMULIN R 500 UNIT/ML injection Generic drug: insulin regular human CONCENTRATED Inject into the skin. Use in insulin pump   insulin pump Soln Inject into the skin.   isosorbide mononitrate 30 MG 24 hr tablet Commonly known as: IMDUR Take 2 tablets (60 mg total) daily by mouth.   meloxicam 15 MG tablet Commonly known as: MOBIC Take 1 tablet (15 mg total) by mouth at bedtime as needed for pain. What changed:   when to take this  reasons to take this   multivitamin with minerals Tabs tablet Take 1 tablet by mouth daily.   nebivolol 10 MG tablet Commonly known as: BYSTOLIC Take 10 mg by mouth daily.   oxybutynin 10 MG 24 hr tablet Commonly known as: Ditropan XL Take 1 tablet (10 mg total) by mouth daily as needed (bladder spasm/stent pain).   prasugrel 10 MG Tabs tablet Commonly known as: EFFIENT Take 10 mg by mouth daily.   rosuvastatin 10 MG tablet Commonly known as: CRESTOR Take 1 tablet (10 mg total) by mouth daily.   tamsulosin 0.4 MG Caps capsule Commonly known as: FLOMAX Take 1 capsule (0.4 mg total) by mouth daily after supper.        Follow-up Information    [facility] Follow up on 09/27/2020.   Specialty: Cardiology Why: at 11:30am. Enter through the [place] entrance Contact information: [address] [place]       [name], MD. Schedule an appointment as soon as possible for a visit in 1 week(s).   Specialty: Internal Medicine Contact information: [address] [place] [address] [phone]        Billey Co, MD. Schedule an appointment as soon as possible for a visit in 1 week(s).   Specialty: Urology Contact information: [place] [address] [phone]               Allergies  Allergen Reactions  . Trulicity [Dulaglutide] Nausea And Vomiting  .  Sulfa Antibiotics Rash     The results of significant diagnostics from this hospitalization (including imaging, microbiology, ancillary and laboratory) are listed below for reference.   Consultations:   Procedures/Studies: CT ANGIO CHEST PE W OR WO CONTRAST  Result Date: 09/17/2020 CLINICAL DATA:  Shortness of breath, polyuria, recent lithotripsy EXAM: CT ANGIOGRAPHY CHEST WITH CONTRAST TECHNIQUE: Multidetector CT imaging of the chest was performed using the standard protocol during bolus administration of intravenous contrast. Multiplanar CT image reconstructions and MIPs were obtained to evaluate the vascular anatomy. CONTRAST:  141mL OMNIPAQUE IOHEXOL 350 MG/ML SOLN COMPARISON:  09/17/2020 FINDINGS: Cardiovascular: This is a technically adequate evaluation of the pulmonary vasculature. There are no filling defects or pulmonary emboli. The heart is mildly enlarged without pericardial effusion. Significant atherosclerosis within the LAD distribution of the coronary vasculature. The aorta is normal in caliber with minimal atherosclerosis of the aortic arch. No dissection. Mediastinum/Nodes: No enlarged mediastinal,  hilar, or axillary lymph nodes. Thyroid gland, trachea, and esophagus demonstrate no significant findings. Lungs/Pleura: No acute airspace disease, effusion, or pneumothorax. Minimal hypoventilatory changes at the lung bases. Central airways are patent. Upper Abdomen: No acute abnormality. Musculoskeletal: No acute or destructive bony lesions. Reconstructed images demonstrate no additional findings. Review of the MIP images confirms the above findings. IMPRESSION: 1. No evidence of pulmonary embolus. 2. No acute intrathoracic process. 3. Mild cardiomegaly. 4. Aortic Atherosclerosis (ICD10-I70.0). Coronary artery atherosclerosis. Electronically Signed   By: Sharlet Salina M.D.   On: 09/17/2020 16:22   DG Chest Portable 1 View  Result Date: 09/17/2020 CLINICAL DATA:  Shortness of breath.  EXAM: PORTABLE CHEST 1 VIEW COMPARISON:  Prior chest radiographs 07/28/2017 and earlier. FINDINGS: Heart size at the upper limits of normal. Mild interstitial opacities within the bilateral lung bases. No evidence of pleural effusion or pneumothorax. No acute bony abnormality identified. IMPRESSION: Mild interstitial opacities within the bilateral lung bases may reflect edema. Atypical/viral pneumonia cannot be excluded and clinical correlation is recommended. Electronically Signed   By: Jackey Loge DO   On: 09/17/2020 08:59   DG OR UROLOGY CYSTO IMAGE ([facility] ONLY)  Result Date: 09/09/2020 There is no interpretation for this exam.  This order is for images obtained during a surgical procedure.  Please See "Surgeries" Tab for more information regarding the procedure.   DG OR UROLOGY CYSTO IMAGE ([facility] ONLY)  Result Date: 08/30/2020 There is no interpretation for this exam.  This order is for images obtained during a surgical procedure.  Please See "Surgeries" Tab for more information regarding the procedure.   ECHOCARDIOGRAM COMPLETE  Result Date: 09/18/2020    ECHOCARDIOGRAM REPORT   Patient Name:   Philip Cook Date of Exam: 09/18/2020 Medical Rec #:  [id number]        Height:       71.0 in Accession #:    [id number]       Weight:       337.5 lb Date of Birth:  09-26-1960        BSA:          2.634 m Patient Age:    59 years         BP:           141/65 mmHg Patient Gender: M                HR:           83 bpm. Exam Location:  [facility] Procedure: 2D Echo, Color Doppler and Cardiac Doppler Indications:     CHF-acute diastolic 150.31  History:         Patient has no prior history of Echocardiogram examinations.                  CHF; Risk Factors:Hypertension and Diabetes.  Sonographer:     Cristela Blue RDCS (AE) Referring Phys:  [id number] Rosalyn Gess NORINS Diagnosing Phys: Harold Hedge MD  Sonographer Comments: No apical window, no subcostal window and Technically challenging study due to limited acoustic  windows. IMPRESSIONS  1. Left ventricular ejection fraction, by estimation, is 65 to 70%. The left ventricle has normal function. Left ventricular endocardial border not optimally defined to evaluate regional wall motion. There is mild left ventricular hypertrophy. Left ventricular diastolic function could not be evaluated.  2. Right ventricular systolic function is normal. The right ventricular size is mildly enlarged.  3. Left atrial size was mildly dilated.  4. The mitral  valve was not well visualized. No evidence of mitral valve regurgitation.  5. The aortic valve was not well visualized. Aortic valve regurgitation is not visualized. FINDINGS  Left Ventricle: Left ventricular ejection fraction, by estimation, is 65 to 70%. The left ventricle has normal function. Left ventricular endocardial border not optimally defined to evaluate regional wall motion. The left ventricular internal cavity size was normal in size. There is mild left ventricular hypertrophy. Left ventricular diastolic function could not be evaluated. Right Ventricle: The right ventricular size is mildly enlarged. No increase in right ventricular wall thickness. Right ventricular systolic function is normal. Left Atrium: Left atrial size was mildly dilated. Right Atrium: Right atrial size was normal in size. Pericardium: There is no evidence of pericardial effusion. Mitral Valve: The mitral valve was not well visualized. No evidence of mitral valve regurgitation. Tricuspid Valve: The tricuspid valve is not well visualized. Tricuspid valve regurgitation is trivial. Aortic Valve: The aortic valve was not well visualized. Aortic valve regurgitation is not visualized. Pulmonic Valve: The pulmonic valve was not assessed. Pulmonic valve regurgitation is not visualized. Aorta: The aortic root was not well visualized and the aortic root is normal in size and structure. IAS/Shunts: The interatrial septum was not assessed.  LEFT VENTRICLE PLAX 2D LVIDd:          5.08 cm LVIDs:         3.24 cm LV PW:         1.61 cm LV IVS:        1.46 cm LVOT diam:     2.30 cm LVOT Area:     4.15 cm  LEFT ATRIUM         Index LA diam:    4.50 cm 1.71 cm/m                        PULMONIC VALVE AORTA                 PV Vmax:        0.90 m/s Ao Root diam: 3.60 cm PV Peak grad:   3.2 mmHg                       RVOT Peak grad: 4 mmHg   SHUNTS Systemic Diam: 2.30 cm Bartholome Bill MD Electronically signed by Bartholome Bill MD Signature Date/Time: 09/18/2020/12:26:11 PM    Final    CT Renal Stone Study  Result Date: 08/30/2020 CLINICAL DATA:  Right flank pain. EXAM: CT ABDOMEN AND PELVIS WITHOUT CONTRAST TECHNIQUE: Multidetector CT imaging of the abdomen and pelvis was performed following the standard protocol without IV contrast. COMPARISON:  CT scan 12/13/2019 FINDINGS: Lower chest: The lung bases are clear of an acute process. No pulmonary nodules or pleural effusions. Heart is upper limits of normal in size. Age advanced coronary artery calcifications are noted. The distal esophagus is grossly normal. Hepatobiliary: No hepatic lesions are identified without contrast. No intrahepatic biliary dilatation. The gallbladder appears normal. No common bile duct dilatation. Pancreas: No mass, inflammation or ductal dilatation. A few small scattered calcifications are stable and could be related to prior inflammation. Spleen: Normal size. No focal lesions. Adrenals/Urinary Tract: The adrenal glands are normal and stable Small left renal calculi are stable. No obstructing left ureteral calculi. No right-sided renal calculi but there is moderate right-sided hydronephrosis and upper hydroureter due to an obstructing 8 x 6 mm calculus in the upper right ureter located at the  L3 level. No distal right ureteral calculi. No bladder calculi. No worrisome renal or bladder lesions are identified without contrast. Stomach/Bowel: The stomach, duodenum, small bowel and colon are grossly normal without oral  contrast. No acute inflammatory changes, mass lesions or obstructive findings. High anterior cecum. The terminal ileum is normal. The appendix is normal. Vascular/Lymphatic: Stable age advanced aortic and iliac artery calcifications. No aneurysm. No mesenteric or retroperitoneal mass or adenopathy. Reproductive: The prostate gland and seminal vesicles are unremarkable. Other: No pelvic mass or adenopathy. No free pelvic fluid collections. No inguinal mass or adenopathy. No abdominal wall hernia or subcutaneous lesions. Musculoskeletal: No significant or acute bony findings. Moderate degenerative changes involving the lumbar spine. IMPRESSION: 1. 8 x 6 mm upper right ureteral calculus causing moderate right-sided hydroureteronephrosis. 2. Stable small left renal calculi. 3. No worrisome renal or bladder lesions without contrast. 4. Stable age advanced aortic and iliac artery calcifications. 5. Age advanced coronary artery calcifications. 6. Aortic atherosclerosis. Aortic Atherosclerosis (ICD10-I70.0). Electronically Signed   By: Marijo Sanes M.D.   On: 08/30/2020 07:54      Labs: BNP (last 3 results) Recent Labs    09/17/20 0848  BNP [id number]   Basic Metabolic Panel: Recent Labs  Lab 09/17/20 0848 09/18/20 0550 09/19/20 0526 09/20/20 0322  NA 139 135 133* 137  K 3.9 4.2 3.9 4.2  CL 101 95* 97* 100  CO2 27 28 26 28   GLUCOSE 160* 299* 354* 245*  BUN 15 29* 26* 19  CREATININE 0.94 1.36* 1.10 1.10  CALCIUM 9.4 8.7* 8.5* 9.0  MG  --   --  2.1 2.0   Liver Function Tests: Recent Labs  Lab 09/17/20 0848  AST 30  ALT 25  ALKPHOS 86  BILITOT 1.0  PROT 7.9  ALBUMIN 4.0   No results for input(s): LIPASE, AMYLASE in the last 168 hours. No results for input(s): AMMONIA in the last 168 hours. CBC: Recent Labs  Lab 09/17/20 1012 09/19/20 0526 09/20/20 0322  WBC 12.5* 9.2 8.9  NEUTROABS 11.7*  --   --   HGB 14.7 12.2* 12.3*  HCT 44.0 37.1* 36.6*  MCV 93.6 94.2 93.4  PLT 153 106* 114*    Cardiac Enzymes: No results for input(s): CKTOTAL, CKMB, CKMBINDEX, TROPONINI in the last 168 hours. BNP: Invalid input(s): POCBNP CBG: Recent Labs  Lab 09/19/20 0839 09/19/20 1143 09/19/20 1620 09/19/20 1917 09/20/20 0808  GLUCAP 375* 318* 295* 372* 225*   D-Dimer No results for input(s): DDIMER in the last 72 hours. Hgb A1c No results for input(s): HGBA1C in the last 72 hours. Lipid Profile No results for input(s): CHOL, HDL, LDLCALC, TRIG, CHOLHDL, LDLDIRECT in the last 72 hours. Thyroid function studies No results for input(s): TSH, T4TOTAL, T3FREE, THYROIDAB in the last 72 hours.  Invalid input(s): FREET3 Anemia work up No results for input(s): VITAMINB12, FOLATE, FERRITIN, TIBC, IRON, RETICCTPCT in the last 72 hours. Urinalysis    Component Value Date/Time   COLORURINE AMBER (A) 09/17/2020 0848   APPEARANCEUR CLOUDY (A) 09/17/2020 0848   LABSPEC 1.009 09/17/2020 0848   PHURINE 6.0 09/17/2020 0848   GLUCOSEU NEGATIVE 09/17/2020 0848   HGBUR LARGE (A) 09/17/2020 0848   BILIRUBINUR NEGATIVE 09/17/2020 0848   KETONESUR NEGATIVE 09/17/2020 0848   PROTEINUR 100 (A) 09/17/2020 0848   NITRITE NEGATIVE 09/17/2020 0848   LEUKOCYTESUR MODERATE (A) 09/17/2020 0848   Sepsis Labs Invalid input(s): PROCALCITONIN,  WBC,  LACTICIDVEN Microbiology Recent Results (from the past 240 hour(s))  Urine Culture  Status: Abnormal   Collection Time: 09/17/20  8:48 AM   Specimen: Urine, Random  Result Value Ref Range Status   Specimen Description   Final    URINE, RANDOM Performed at [facility], [address] [place], [place] [address]    Special Requests   Final    NONE Performed at [facility], [address] [place], [place] [address]    Culture >=100,000 COLONIES/mL SERRATIA MARCESCENS (A)  Final   Report Status 09/20/2020 FINAL  Final   Organism ID, Bacteria SERRATIA MARCESCENS (A)  Final      Susceptibility   Serratia marcescens - MIC*     CEFAZOLIN >=64 RESISTANT Resistant     CEFEPIME <=0.12 SENSITIVE Sensitive     CEFTRIAXONE <=0.25 SENSITIVE Sensitive     CIPROFLOXACIN <=0.25 SENSITIVE Sensitive     GENTAMICIN <=1 SENSITIVE Sensitive     NITROFURANTOIN 256 RESISTANT Resistant     TRIMETH/SULFA <=20 SENSITIVE Sensitive     * >=100,000 COLONIES/mL SERRATIA MARCESCENS  Resp Panel by RT-PCR (Flu A&B, Covid) Nasopharyngeal Swab     Status: None   Collection Time: 09/17/20 12:14 PM   Specimen: Nasopharyngeal Swab; Nasopharyngeal(NP) swabs in vial transport medium  Result Value Ref Range Status   SARS Coronavirus 2 by RT PCR NEGATIVE NEGATIVE Final    Comment: (NOTE) SARS-CoV-2 target nucleic acids are NOT DETECTED.  The SARS-CoV-2 RNA is generally detectable in upper respiratory specimens during the acute phase of infection. The lowest concentration of SARS-CoV-2 viral copies this assay can detect is 138 copies/mL. A negative result does not preclude SARS-Cov-2 infection and should not be used as the sole basis for treatment or other patient management decisions. A negative result may occur with  improper specimen collection/handling, submission of specimen other than nasopharyngeal swab, presence of viral mutation(s) within the areas targeted by this assay, and inadequate number of viral copies(<138 copies/mL). A negative result must be combined with clinical observations, patient history, and epidemiological information. The expected result is Negative.  Fact Sheet for Patients:  BloggerCourse.com  Fact Sheet for Healthcare Providers:  SeriousBroker.it  This test is no t yet approved or cleared by the [place] FDA and  has been authorized for detection and/or diagnosis of SARS-CoV-2 by FDA under an Emergency Use Authorization (EUA). This EUA will remain  in effect (meaning this test can be used) for the duration of the COVID-19 declaration under Section  564(b)(1) of the Act, 21 U.S.C.section 360bbb-3(b)(1), unless the authorization is terminated  or revoked sooner.       Influenza A by PCR NEGATIVE NEGATIVE Final   Influenza B by PCR NEGATIVE NEGATIVE Final    Comment: (NOTE) The Xpert Xpress SARS-CoV-2/FLU/RSV plus assay is intended as an aid in the diagnosis of influenza from Nasopharyngeal swab specimens and should not be used as a sole basis for treatment. Nasal washings and aspirates are unacceptable for Xpert Xpress SARS-CoV-2/FLU/RSV testing.  Fact Sheet for Patients: BloggerCourse.com  Fact Sheet for Healthcare Providers: SeriousBroker.it  This test is not yet approved or cleared by the [place] FDA and has been authorized for detection and/or diagnosis of SARS-CoV-2 by FDA under an Emergency Use Authorization (EUA). This EUA will remain in effect (meaning this test can be used) for the duration of the COVID-19 declaration under Section 564(b)(1) of the Act, 21 U.S.C. section 360bbb-3(b)(1), unless the authorization is terminated or revoked.  Performed at [facility], [address], [place], [place] [address]  Total time spend on discharging this patient, including the last patient exam, discussing the hospital stay, instructions for ongoing care as it relates to all pertinent caregivers, as well as preparing the medical discharge records, prescriptions, and/or referrals as applicable, is 30 minutes.    Enzo Bi, MD  [facility] 09/20/2020, 10:43 AM

## 2020-09-20 NOTE — Progress Notes (Signed)
Inpatient Diabetes Program Recommendations  AACE/ADA: New Consensus Statement on Inpatient Glycemic Control (2015)  Target Ranges:  Prepandial:   less than 140 mg/dL      Peak postprandial:   less than 180 mg/dL (1-2 hours)      Critically ill patients:  140 - 180 mg/dL   Results for Philip Cook, Philip Cook (MRN [id number]) as of 09/20/2020 10:10  Ref. Range 09/19/2020 08:39 09/19/2020 11:43 09/19/2020 16:20 09/19/2020 19:17  Glucose-Capillary Latest Ref Range: 70 - 99 mg/dL 062 (H)  25 units NOVOLOG  25 units LANTUS @9am   318 (H)  20 units NOVOLOG  295 (H)  16 units NOVOLOG  372 (H)     25 units LANTUS @10pm    Results for Philip Cook, Philip Cook (MRN ) as of 09/20/2020 10:10  Ref. Range 09/20/2020 08:08  Glucose-Capillary Latest Ref Range: 70 - 99 mg/dL 09/22/2020 (H)  12 units NOVOLOG  25 units LANTUS   Home DM Meds: Medtronic insulin pump with Humulin R U500 insulin   Current Orders: Lantus 25 units BID       Novolog 0-20 units TID      Novolog 5 units TID with meals    Novolog Meal Coverage started yest AM    MD- Note AM CBG 225 this AM.  CBGs significantly elevated yesterday afternoon as well.  Please consider:  1. Increase Lantus to 30 units BID  2. Increase Novolog Meal Coverage to 10 units TID with meals    --Will follow patient during hospitalization--  09/22/2020 RN, MSN, CDE Diabetes Coordinator Inpatient Glycemic Control Team Team Pager: [phone] (8a-5p)

## 2020-09-25 ENCOUNTER — Other Ambulatory Visit: Payer: Self-pay

## 2020-09-25 ENCOUNTER — Ambulatory Visit: Payer: BC Managed Care – PPO | Admitting: Urology

## 2020-09-25 ENCOUNTER — Encounter: Payer: Self-pay | Admitting: Urology

## 2020-09-25 VITALS — BP 132/71 | HR 77 | Ht 70.0 in | Wt 340.0 lb

## 2020-09-25 DIAGNOSIS — N201 Calculus of ureter: Secondary | ICD-10-CM

## 2020-09-25 NOTE — Patient Instructions (Signed)
Dietary Guidelines to Help Prevent Kidney Stones Kidney stones are deposits of minerals and salts that form inside your kidneys. Your risk of developing kidney stones may be greater depending on your diet, your lifestyle, the medicines you take, and whether you have certain medical conditions. Most people can reduce their chances of developing kidney stones by following the instructions below. Depending on your overall health and the type of kidney stones you tend to develop, your dietitian may give you more specific instructions. What are tips for following this plan? Reading food labels  Choose foods with "no salt added" or "low-salt" labels. Limit your sodium intake to less than 1500 mg per day.  Choose foods with calcium for each meal and snack. Try to eat about 300 mg of calcium at each meal. Foods that contain 200-500 mg of calcium per serving include: ? 8 oz (237 ml) of milk, fortified nondairy milk, and fortified fruit juice. ? 8 oz (237 ml) of kefir, yogurt, and soy yogurt. ? 4 oz (118 ml) of tofu. ? 1 oz of cheese. ? 1 cup (300 g) of dried figs. ? 1 cup (91 g) of cooked broccoli. ? 1-3 oz can of sardines or mackerel.  Most people need 1000 to 1500 mg of calcium each day. Talk to your dietitian about how much calcium is recommended for you. Shopping  Buy plenty of fresh fruits and vegetables. Most people do not need to avoid fruits and vegetables, even if they contain nutrients that may contribute to kidney stones.  When shopping for convenience foods, choose: ? Whole pieces of fruit. ? Premade salads with dressing on the side. ? Low-fat fruit and yogurt smoothies.  Avoid buying frozen meals or prepared deli foods.  Look for foods with live cultures, such as yogurt and kefir. Cooking  Do not add salt to food when cooking. Place a salt shaker on the table and allow each person to add his or her own salt to taste.  Use vegetable protein, such as beans, textured vegetable  protein (TVP), or tofu instead of meat in pasta, casseroles, and soups. Meal planning   Eat less salt, if told by your dietitian. To do this: ? Avoid eating processed or premade food. ? Avoid eating fast food.  Eat less animal protein, including cheese, meat, poultry, or fish, if told by your dietitian. To do this: ? Limit the number of times you have meat, poultry, fish, or cheese each week. Eat a diet free of meat at least 2 days a week. ? Eat only one serving each day of meat, poultry, fish, or seafood. ? When you prepare animal protein, cut pieces into small portion sizes. For most meat and fish, one serving is about the size of one deck of cards.  Eat at least 5 servings of fresh fruits and vegetables each day. To do this: ? Keep fruits and vegetables on hand for snacks. ? Eat 1 piece of fruit or a handful of berries with breakfast. ? Have a salad and fruit at lunch. ? Have two kinds of vegetables at dinner.  Limit foods that are high in a substance called oxalate. These include: ? Spinach. ? Rhubarb. ? Beets. ? Potato chips and french fries. ? Nuts.  If you regularly take a diuretic medicine, make sure to eat at least 1-2 fruits or vegetables high in potassium each day. These include: ? Avocado. ? Banana. ? Orange, prune, carrot, or tomato juice. ? Baked potato. ? Cabbage. ? Beans and split   peas. General instructions   Drink enough fluid to keep your urine clear or pale yellow. This is the most important thing you can do.  Talk to your health care provider and dietitian about taking daily supplements. Depending on your health and the cause of your kidney stones, you may be advised: ? Not to take supplements with vitamin C. ? To take a calcium supplement. ? To take a daily probiotic supplement. ? To take other supplements such as magnesium, fish oil, or vitamin B6.  Take all medicines and supplements as told by your health care provider.  Limit alcohol intake to no  more than 1 drink a day for nonpregnant women and 2 drinks a day for men. One drink equals 12 oz of beer, 5 oz of wine, or 1 oz of hard liquor.  Lose weight if told by your health care provider. Work with your dietitian to find strategies and an eating plan that works best for you. What foods are not recommended? Limit your intake of the following foods, or as told by your dietitian. Talk to your dietitian about specific foods you should avoid based on the type of kidney stones and your overall health. Grains Breads. Bagels. Rolls. Baked goods. Salted crackers. Cereal. Pasta. Vegetables Spinach. Rhubarb. Beets. Canned vegetables. Pickles. Olives. Meats and other protein foods Nuts. Nut butters. Large portions of meat, poultry, or fish. Salted or cured meats. Deli meats. Hot dogs. Sausages. Dairy Cheese. Beverages Regular soft drinks. Regular vegetable juice. Seasonings and other foods Seasoning blends with salt. Salad dressings. Canned soups. Soy sauce. Ketchup. Barbecue sauce. Canned pasta sauce. Casseroles. Pizza. Lasagna. Frozen meals. Potato chips. French fries. Summary  You can reduce your risk of kidney stones by making changes to your diet.  The most important thing you can do is drink enough fluid. You should drink enough fluid to keep your urine clear or pale yellow.  Ask your health care provider or dietitian how much protein from animal sources you should eat each day, and also how much salt and calcium you should have each day. This information is not intended to replace advice given to you by your health care provider. Make sure you discuss any questions you have with your health care provider. Document Revised: 12/28/2018 Document Reviewed: 08/18/2016 Elsevier Patient Education  2020 [employer]  

## 2020-09-25 NOTE — Progress Notes (Signed)
Cystoscopy Procedure Note:  Indication: Stent removal s/p right ureteroscopy, laser lithotripsy, stent placement.    Recently hospitalized for suspected UTI, and urine culture grew Serratia and was treated with culture appropriate Cipro.  After informed consent and discussion of the procedure and its risks, Philip Cook was positioned and prepped in the standard fashion. Cystoscopy was performed with a flexible cystoscope. The stent was grasped with flexible graspers and removed in its entirety. The patient tolerated the procedure well.  Findings: Uncomplicated stent removal  Assessment and Plan: Follow up as needed  Sondra Come, MD 09/25/2020

## 2020-09-26 LAB — MICROSCOPIC EXAMINATION
Bacteria, UA: NONE SEEN
RBC, Urine: >30 /hpf — AB (ref 0–2)

## 2020-09-26 LAB — URINALYSIS, COMPLETE
Bilirubin, UA: NEGATIVE
Glucose, UA: NEGATIVE
Ketones, UA: NEGATIVE
Nitrite, UA: NEGATIVE
Specific Gravity, UA: 1.02 (ref 1.005–1.030)
Urobilinogen, Ur: 0.2 mg/dL (ref 0.2–1.0)
pH, UA: 6.5 (ref 5.0–7.5)

## 2020-09-27 ENCOUNTER — Ambulatory Visit: Payer: BC Managed Care – PPO | Attending: Family | Admitting: Family

## 2020-09-27 ENCOUNTER — Other Ambulatory Visit: Payer: Self-pay

## 2020-09-27 ENCOUNTER — Encounter: Payer: Self-pay | Admitting: Family

## 2020-09-27 VITALS — BP 125/60 | HR 72 | Resp 18 | Ht 70.0 in | Wt 334.4 lb

## 2020-09-27 DIAGNOSIS — I110 Hypertensive heart disease with heart failure: Secondary | ICD-10-CM | POA: Insufficient documentation

## 2020-09-27 DIAGNOSIS — Z79899 Other long term (current) drug therapy: Secondary | ICD-10-CM | POA: Diagnosis not present

## 2020-09-27 DIAGNOSIS — Z8249 Family history of ischemic heart disease and other diseases of the circulatory system: Secondary | ICD-10-CM | POA: Insufficient documentation

## 2020-09-27 DIAGNOSIS — Z794 Long term (current) use of insulin: Secondary | ICD-10-CM | POA: Diagnosis not present

## 2020-09-27 DIAGNOSIS — Z882 Allergy status to sulfonamides status: Secondary | ICD-10-CM | POA: Diagnosis not present

## 2020-09-27 DIAGNOSIS — G4733 Obstructive sleep apnea (adult) (pediatric): Secondary | ICD-10-CM | POA: Diagnosis not present

## 2020-09-27 DIAGNOSIS — I5032 Chronic diastolic (congestive) heart failure: Secondary | ICD-10-CM | POA: Insufficient documentation

## 2020-09-27 DIAGNOSIS — E785 Hyperlipidemia, unspecified: Secondary | ICD-10-CM | POA: Insufficient documentation

## 2020-09-27 DIAGNOSIS — Z7902 Long term (current) use of antithrombotics/antiplatelets: Secondary | ICD-10-CM | POA: Insufficient documentation

## 2020-09-27 DIAGNOSIS — Z87440 Personal history of urinary (tract) infections: Secondary | ICD-10-CM | POA: Diagnosis not present

## 2020-09-27 DIAGNOSIS — Z955 Presence of coronary angioplasty implant and graft: Secondary | ICD-10-CM | POA: Insufficient documentation

## 2020-09-27 DIAGNOSIS — I2510 Atherosclerotic heart disease of native coronary artery without angina pectoris: Secondary | ICD-10-CM | POA: Insufficient documentation

## 2020-09-27 DIAGNOSIS — I10 Essential (primary) hypertension: Secondary | ICD-10-CM

## 2020-09-27 DIAGNOSIS — E119 Type 2 diabetes mellitus without complications: Secondary | ICD-10-CM | POA: Diagnosis not present

## 2020-09-27 DIAGNOSIS — Z7982 Long term (current) use of aspirin: Secondary | ICD-10-CM | POA: Diagnosis not present

## 2020-09-27 NOTE — Progress Notes (Signed)
Patient ID: Philip Cook, male    DOB: 1961/04/08, 60 y.o.   MRN: [id number]  HPI  Philip Cook is a 60 y/o male with a history of HTN, hyperlipidemia, diabetes, CAD, obstructive sleep apnea and chronic heart failure.   Echo report from 09/18/20 reviewed and showed an EF of 65-70% along with mild LVH/ LAE.   Had a cardiac catheterization dene 04/07/17 which showed normal left ventricular function of 60%, patent proximal LAD stent, diagonal 1 and circumflex with moderate disease and RCA relatively free of disease.   Admitted 09/17/20 due to shortness of breath related to sepsis from UTI. Given IV antibiotics with transition to oral medication. Given a dose of IV lasix which caused AKI. Discharged after 3 days.   He presents today for his follow-up visit although hasn't been seen since 2019. He presents with a chief complaint of minimal shortness of breath upon moderate exertion. He describes this as chronic in nature having been present for several years. He has associated fatigue and pedal edema (improving) along with this. He denies any difficulty sleeping, dizziness, abdominal distention, palpitations, chest pain, cough or weight gain.   Says that overall he feels quite well today. Is interested in getting back to pulmonary rehab if possible. Last went through it ~ 3-4 years ago he thinks.   Past Medical History:  Diagnosis Date  . CHF (congestive heart failure) ([place])   . Coronary artery disease   . Diabetes mellitus without complication ([place])   . History of kidney stones   . Hx MRSA infection   . Hyperlipidemia   . Hypertension   . Kidney stone   . Sleep apnea    Past Surgical History:  Procedure Laterality Date  . CARPAL TUNNEL RELEASE Bilateral   . COLONOSCOPY WITH PROPOFOL N/A 02/08/2020   Procedure: COLONOSCOPY WITH PROPOFOL;  Surgeon: Toledo, Benay Pike, MD;  Location: [facility];  Service: Gastroenterology;  Laterality: N/A;  . CORONARY ANGIOPLASTY WITH STENT PLACEMENT     . CYSTOSCOPY W/ RETROGRADES  08/30/2020   Procedure: CYSTOSCOPY WITH RETROGRADE PYELOGRAM;  Surgeon: Billey Co, MD;  Location: [facility] ORS;  Service: Urology;;  . CYSTOSCOPY/URETEROSCOPY/HOLMIUM LASER/STENT PLACEMENT Right 08/30/2020   Procedure: CYSTOSCOPY/URETEROSCOPY/STENT PLACEMENT;  Surgeon: Billey Co, MD;  Location: [facility] ORS;  Service: Urology;  Laterality: Right;  . CYSTOSCOPY/URETEROSCOPY/HOLMIUM LASER/STENT PLACEMENT Right 09/09/2020   Procedure: CYSTOSCOPY/URETEROSCOPY/HOLMIUM LASER/STENT EXCHANGE;  Surgeon: Billey Co, MD;  Location: [facility] OR[facility];  Service: Urology;  Laterality: Right;  . LEFT HEART CATH AND CORONARY ANGIOGRAPHY N/A 04/07/2017   Procedure: Left Heart Cath and Coronary Angiography and possible PCI;  Surgeon: Yolonda Kida, MD;  Location: [facility];  Service: Cardiovascular;  Laterality: N/A;  . ROTATOR CUFF REPAIR Left    Family History  Problem Relation Age of Onset  . CAD Father   . Heart failure Brother    Social History   Tobacco Use  . Smoking status: Never Smoker  . Smokeless tobacco: Never Used  Substance Use Topics  . Alcohol use: No   Allergies  Allergen Reactions  . Trulicity [Dulaglutide] Nausea And Vomiting  . Sulfa Antibiotics Rash   Prior to Admission medications   Medication Sig Start Date End Date Taking? Authorizing Provider  acetaminophen (TYLENOL) 500 MG tablet Take 1,000 mg by mouth every 6 (six) hours as needed for moderate pain.   Yes [provider]  aspirin 325 MG tablet Discuss with your outpatient provider about reducing or stopping this,  since you are already on Effient which is a similar blood thinner. 09/20/20  Yes Enzo Bi, MD  furosemide (LASIX) 20 MG tablet Take 1 tablet (20 mg total) by mouth 2 (two) times daily. Patient taking differently: Take 20 mg by mouth 2 (two) times daily. 04/07/17 02/08/20 Yes Dustin Flock, MD  HUMULIN R 500 UNIT/ML injection Inject into the skin. Use in  insulin pump 08/26/20  Yes [provider]  Insulin Human (INSULIN PUMP) SOLN Inject into the skin.    Yes [provider]  isosorbide mononitrate (IMDUR) 30 MG 24 hr tablet Take 2 tablets (60 mg total) daily by mouth. 07/29/17  Yes Fritzi Mandes, MD  meloxicam (MOBIC) 15 MG tablet Take 1 tablet (15 mg total) by mouth at bedtime as needed for pain. 09/20/20  Yes Enzo Bi, MD  Multiple Vitamin (MULTIVITAMIN WITH MINERALS) TABS tablet Take 1 tablet by mouth daily.   Yes [provider]  nebivolol (BYSTOLIC) 10 MG tablet Take 10 mg by mouth daily.   Yes [provider]  oxybutynin (DITROPAN XL) 10 MG 24 hr tablet Take 1 tablet (10 mg total) by mouth daily as needed (bladder spasm/stent pain). 08/30/20 08/30/21 Yes Billey Co, MD  prasugrel (EFFIENT) 10 MG TABS tablet Take 10 mg by mouth daily. 01/04/17  Yes [provider]  tamsulosin (FLOMAX) 0.4 MG CAPS capsule Take 1 capsule (0.4 mg total) by mouth daily after supper. 09/11/20  Yes Billey Co, MD  rosuvastatin (CRESTOR) 10 MG tablet Take 1 tablet (10 mg total) by mouth daily. 04/08/17   Dustin Flock, MD    Review of Systems  Constitutional: Positive for fatigue. Negative for appetite change.  HENT: Negative for congestion, postnasal drip and sore throat.   Eyes: Negative.   Respiratory: Positive for shortness of breath (when climbing stairs). Negative for cough and chest tightness.   Cardiovascular: Positive for leg swelling (improving). Negative for chest pain and palpitations.  Gastrointestinal: Negative for abdominal distention and abdominal pain.  Endocrine: Negative.   Genitourinary: Negative.   Musculoskeletal: Negative for arthralgias and back pain.  Skin: Negative.   Allergic/Immunologic: Negative.   Neurological: Negative for dizziness and light-headedness.  Hematological: Negative for adenopathy. Bruises/bleeds easily.  Psychiatric/Behavioral: Negative for dysphoric mood and  sleep disturbance (wearing CPAP nightly). The patient is not nervous/anxious.    Vitals:   09/27/20 1129  BP: 125/60  Pulse: 72  Resp: 18  SpO2: 99%  Weight: (!) 334 lb 6 oz (151.7 kg)  Height: 5\' 10"  (1.778 m)   Wt Readings from Last 3 Encounters:  09/27/20 (!) 334 lb 6 oz (151.7 kg)  09/25/20 (!) 340 lb (154.2 kg)  09/18/20 (!) 337 lb 8.4 oz (153.1 kg)   Lab Results  Component Value Date   CREATININE 1.10 09/20/2020   CREATININE 1.10 09/19/2020   CREATININE 1.36 (H) 09/18/2020    Physical Exam Vitals and nursing note reviewed.  Constitutional:      Appearance: He is well-developed.  HENT:     Head: Normocephalic and atraumatic.  Neck:     Vascular: No JVD.  Cardiovascular:     Rate and Rhythm: Normal rate and regular rhythm.  Pulmonary:     Effort: Pulmonary effort is normal.     Breath sounds: No wheezing or rales.  Abdominal:     General: There is no distension.     Palpations: Abdomen is soft.     Tenderness: There is no abdominal tenderness.  Musculoskeletal:  General: No tenderness.     Cervical back: Normal range of motion and neck supple.  Skin:    General: Skin is warm and dry.     Findings: Ecchymosis (LLQ) present.  Neurological:     Mental Status: He is alert and oriented to person, place, and time.  Psychiatric:        Behavior: Behavior normal.        Thought Content: Thought content normal.    Assessment & Plan:  1: Chronic heart failure with preserved ejection fraction with minimal structural changes (LVH/LAE)- - NYHA class II - euvolemic today - continues to weigh daily and says that his weight has declined. Reminded to call for an overnight weight gain of >2 pounds or a weekly weight gain of >5 pounds - not adding salt to his food and has been reading food labels. - has ordered some compression sleeves for his leg swelling as he says that the compression socks hurt his toes - saw cardiologist ([place]) 07/19/20 - he would like  to return to pulmonary rehab as he feels like it really helped him the last time he went through it; referral placed today - will decrease his aspirin to 81mg  daily until he sees Dr. Saralyn Pilar (currently taking 325mg  along with effient 10mg ) - BNP 09/17/20 was 57.2 - has received 3 covid vaccines - has received his flu vaccine for this season  2: Diabetes- - fasting glucose at home this morning was 162  - saw endocrinologist ([place]) 06/14/20 - A1c on 08/30/20 was 8.3%  3: HTN- - BP looks good today - saw PCP ([place]) 07/05/20 - BMP on 09/20/20 reviewed and showed sodium 137, potassium 4.2, creatinine 1.1 and GFR >60   Patient did not bring his medications nor a list. Each medication was verbally reviewed with the patient and he was encouraged to bring the bottles to every visit to confirm accuracy of list.  Due to HF stability, will not make a return appointment for patient at this time. Advised him that he could call back at anytime to schedule another appointment and he was comfortable with this plan.

## 2020-09-27 NOTE — Patient Instructions (Addendum)
Continue weighing daily and call for an overnight weight gain of > 2 pounds or a weekly weight gain of >5 pounds.   Decrease aspirin to 81mg  daily until you see Dr. Saralyn Pilar.   Order placed for rehab   Call us in the future if you'd like to schedule another appointment.

## 2020-10-17 ENCOUNTER — Encounter: Payer: BC Managed Care – PPO | Attending: Cardiology | Admitting: *Deleted

## 2020-10-17 ENCOUNTER — Other Ambulatory Visit: Payer: Self-pay

## 2020-10-17 DIAGNOSIS — I5032 Chronic diastolic (congestive) heart failure: Secondary | ICD-10-CM

## 2020-10-17 NOTE — Progress Notes (Signed)
Virtual orientation call completed today. he has an appointment on Date: 10/24/2020 for EP eval and gym Orientation.  Documentation of diagnosis can be found in [facility]  Date: 09/27/20 and 07/19/20.

## 2020-10-24 ENCOUNTER — Encounter: Payer: BC Managed Care – PPO | Attending: Cardiology

## 2020-10-24 ENCOUNTER — Other Ambulatory Visit: Payer: Self-pay

## 2020-10-24 VITALS — Ht 70.0 in | Wt 343.1 lb

## 2020-10-24 DIAGNOSIS — I5032 Chronic diastolic (congestive) heart failure: Secondary | ICD-10-CM | POA: Insufficient documentation

## 2020-10-24 NOTE — Patient Instructions (Signed)
Patient Instructions  Patient Details  Name: Philip Cook MRN: [id number] Date of Birth: 06/19/1961 Referring Provider:  Isaias Cowman, MD  Below are your personal goals for exercise, nutrition, and risk factors. Our goal is to help you stay on track towards obtaining and maintaining these goals. We will be discussing your progress on these goals with you throughout the program.  Initial Exercise Prescription:  Initial Exercise Prescription - 10/24/20 1300      Date of Initial Exercise RX and Referring Provider   Date 10/24/20    Referring Provider Paraschos, Alexander MD      Treadmill   MPH 1.7    Grade 0    Minutes 15    METs 2.3      Recumbant Bike   Level 2    RPM 60    Watts 15    Minutes 15    METs 2.3      NuStep   Level 2    SPM 80    Minutes 15    METs 2.3      T5 Nustep   Level 1    SPM 80    Minutes 15    METs 2.3      Biostep-RELP   Level 1    SPM 50    Minutes 15    METs 2.3      Prescription Details   Frequency (times per week) 2    Duration Progress to 30 minutes of continuous aerobic without signs/symptoms of physical distress      Intensity   THRR 40-80% of Max Heartrate 106-144    Ratings of Perceived Exertion 11-13    Perceived Dyspnea 0-4      Progression   Progression Continue to progress workloads to maintain intensity without signs/symptoms of physical distress.      Resistance Training   Training Prescription Yes    Weight 3 lb    Reps 10-15           Exercise Goals: Frequency: Be able to perform aerobic exercise two to three times per week in program working toward 2-5 days per week of home exercise.  Intensity: Work with a perceived exertion of 11 (fairly light) - 15 (hard) while following your exercise prescription.  We will make changes to your prescription with you as you progress through the program.   Duration: Be able to do 30 to 45 minutes of continuous aerobic exercise in addition to a 5 minute  warm-up and a 5 minute cool-down routine.   Nutrition Goals: Your personal nutrition goals will be established when you do your nutrition analysis with the dietician.  The following are general nutrition guidelines to follow: Cholesterol < 200mg /day Sodium < 1500mg /day Fiber: Men over 50 yrs - 30 grams per day  Personal Goals:  Personal Goals and Risk Factors at Admission - 10/24/20 1352      Core Components/Risk Factors/Patient Goals on Admission    Weight Management Yes;Obesity;Weight Loss    Intervention Weight Management: Develop a combined nutrition and exercise program designed to reach desired caloric intake, while maintaining appropriate intake of nutrient and fiber, sodium and fats, and appropriate energy expenditure required for the weight goal.;Weight Management: Provide education and appropriate resources to help participant work on and attain dietary goals.;Weight Management/Obesity: Establish reasonable short term and long term weight goals.;Obesity: Provide education and appropriate resources to help participant work on and attain dietary goals.    Admit Weight 343 lb (155.6 kg)  Goal Weight: Short Term 338 lb (153.3 kg)    Goal Weight: Long Term 323 lb (146.5 kg)    Expected Outcomes Short Term: Continue to assess and modify interventions until short term weight is achieved;Long Term: Adherence to nutrition and physical activity/exercise program aimed toward attainment of established weight goal;Weight Loss: Understanding of general recommendations for a balanced deficit meal plan, which promotes 1-2 lb weight loss per week and includes a negative energy balance of [phone] kcal/d    Diabetes Yes    Intervention Provide education about signs/symptoms and action to take for hypo/hyperglycemia.;Provide education about proper nutrition, including hydration, and aerobic/resistive exercise prescription along with prescribed medications to achieve blood glucose in normal ranges:  Fasting glucose 65-99 mg/dL    Expected Outcomes Short Term: Participant verbalizes understanding of the signs/symptoms and immediate care of hyper/hypoglycemia, proper foot care and importance of medication, aerobic/resistive exercise and nutrition plan for blood glucose control.;Long Term: Attainment of HbA1C < 7%.    Heart Failure Yes    Intervention Provide a combined exercise and nutrition program that is supplemented with education, support and counseling about heart failure. Directed toward relieving symptoms such as shortness of breath, decreased exercise tolerance, and extremity edema.    Expected Outcomes Improve functional capacity of life;Short term: Attendance in program 2-3 days a week with increased exercise capacity. Reported lower sodium intake. Reported increased fruit and vegetable intake. Reports medication compliance.;Short term: Daily weights obtained and reported for increase. Utilizing diuretic protocols set by physician.;Long term: Adoption of self-care skills and reduction of barriers for early signs and symptoms recognition and intervention leading to self-care maintenance.    Hypertension Yes    Intervention Provide education on lifestyle modifcations including regular physical activity/exercise, weight management, moderate sodium restriction and increased consumption of fresh fruit, vegetables, and low fat dairy, alcohol moderation, and smoking cessation.;Monitor prescription use compliance.    Expected Outcomes Short Term: Continued assessment and intervention until BP is < 140/33mm HG in hypertensive participants. < 130/38mm HG in hypertensive participants with diabetes, heart failure or chronic kidney disease.;Long Term: Maintenance of blood pressure at goal levels.    Lipids Yes    Intervention Provide education and support for participant on nutrition & aerobic/resistive exercise along with prescribed medications to achieve LDL 70mg , HDL >40mg .    Expected Outcomes Short  Term: Participant states understanding of desired cholesterol values and is compliant with medications prescribed. Participant is following exercise prescription and nutrition guidelines.;Long Term: Cholesterol controlled with medications as prescribed, with individualized exercise RX and with personalized nutrition plan. Value goals: LDL < 70mg , HDL > 40 mg.           Tobacco Use Initial Evaluation: Social History   Tobacco Use  Smoking Status Never Smoker  Smokeless Tobacco Never Used    Exercise Goals and Review:  Exercise Goals    Row Name 10/24/20 1351             Exercise Goals   Increase Physical Activity Yes       Intervention Provide advice, education, support and counseling about physical activity/exercise needs.;Develop an individualized exercise prescription for aerobic and resistive training based on initial evaluation findings, risk stratification, comorbidities and participant's personal goals.       Expected Outcomes Short Term: Attend rehab on a regular basis to increase amount of physical activity.;Long Term: Add in home exercise to make exercise part of routine and to increase amount of physical activity.;Long Term: Exercising regularly at least 3-5 days a  week.       Increase Strength and Stamina Yes       Intervention Provide advice, education, support and counseling about physical activity/exercise needs.;Develop an individualized exercise prescription for aerobic and resistive training based on initial evaluation findings, risk stratification, comorbidities and participant's personal goals.       Expected Outcomes Short Term: Increase workloads from initial exercise prescription for resistance, speed, and METs.;Short Term: Perform resistance training exercises routinely during rehab and add in resistance training at home;Long Term: Improve cardiorespiratory fitness, muscular endurance and strength as measured by increased METs and functional capacity (6MWT)        Able to understand and use rate of perceived exertion (RPE) scale Yes       Intervention Provide education and explanation on how to use RPE scale       Expected Outcomes Short Term: Able to use RPE daily in rehab to express subjective intensity level;Long Term:  Able to use RPE to guide intensity level when exercising independently       Able to understand and use Dyspnea scale Yes       Intervention Provide education and explanation on how to use Dyspnea scale       Expected Outcomes Short Term: Able to use Dyspnea scale daily in rehab to express subjective sense of shortness of breath during exertion;Long Term: Able to use Dyspnea scale to guide intensity level when exercising independently       Knowledge and understanding of Target Heart Rate Range (THRR) Yes       Intervention Provide education and explanation of THRR including how the numbers were predicted and where they are located for reference       Expected Outcomes Short Term: Able to state/look up THRR;Short Term: Able to use daily as guideline for intensity in rehab;Long Term: Able to use THRR to govern intensity when exercising independently       Able to check pulse independently Yes       Intervention Provide education and demonstration on how to check pulse in carotid and radial arteries.;Review the importance of being able to check your own pulse for safety during independent exercise       Expected Outcomes Short Term: Able to explain why pulse checking is important during independent exercise;Long Term: Able to check pulse independently and accurately       Understanding of Exercise Prescription Yes       Intervention Provide education, explanation, and written materials on patient's individual exercise prescription       Expected Outcomes Short Term: Able to explain program exercise prescription;Long Term: Able to explain home exercise prescription to exercise independently              Copy of goals given to participant.

## 2020-10-24 NOTE — Progress Notes (Signed)
Cardiac Individual Treatment Plan  Patient Details  Name: Philip Cook MRN: [id number] Date of Birth: 23-Jun-1961 Referring Provider:   Flowsheet Row Cardiac Rehab from 10/24/2020 in [facility] Cardiac and Pulmonary Rehab  Referring Provider Isaias Cowman MD      Initial Encounter Date:  Flowsheet Row Cardiac Rehab from 10/24/2020 in [facility] Cardiac and Pulmonary Rehab  Date 10/24/20      Visit Diagnosis: Chronic diastolic heart failure ([place])  Patient's Home Medications on Admission:  Current Outpatient Medications:  .  acetaminophen (TYLENOL) 500 MG tablet, Take 1,000 mg by mouth every 6 (six) hours as needed for moderate pain., Disp: , Rfl:  .  aspirin 325 MG tablet, Discuss with your outpatient provider about reducing or stopping this, since you are already on Effient which is a similar blood thinner. (Patient not taking: Reported on 10/17/2020), Disp: , Rfl:  .  aspirin EC 81 MG tablet, Take 81 mg by mouth daily. Swallow whole., Disp: , Rfl:  .  furosemide (LASIX) 20 MG tablet, Take 1 tablet (20 mg total) by mouth 2 (two) times daily. (Patient taking differently: Take 20 mg by mouth 2 (two) times daily.), Disp: 60 tablet, Rfl: 11 .  HUMULIN R 500 UNIT/ML injection, Inject into the skin. Use in insulin pump, Disp: , Rfl:  .  Insulin Human (INSULIN PUMP) SOLN, Inject into the skin. , Disp: , Rfl:  .  isosorbide mononitrate (IMDUR) 30 MG 24 hr tablet, Take 2 tablets (60 mg total) daily by mouth., Disp: 30 tablet, Rfl: 0 .  meloxicam (MOBIC) 15 MG tablet, Take 1 tablet (15 mg total) by mouth at bedtime as needed for pain., Disp: , Rfl:  .  Multiple Vitamin (MULTIVITAMIN WITH MINERALS) TABS tablet, Take 1 tablet by mouth daily., Disp: , Rfl:  .  nebivolol (BYSTOLIC) 10 MG tablet, Take 10 mg by mouth daily., Disp: , Rfl:  .  oxybutynin (DITROPAN XL) 10 MG 24 hr tablet, Take 1 tablet (10 mg total) by mouth daily as needed (bladder spasm/stent pain). (Patient not taking: Reported on  10/17/2020), Disp: 30 tablet, Rfl: 0 .  prasugrel (EFFIENT) 10 MG TABS tablet, Take 10 mg by mouth daily., Disp: , Rfl:  .  rosuvastatin (CRESTOR) 10 MG tablet, Take 1 tablet (10 mg total) by mouth daily., Disp: 30 tablet, Rfl: 0 .  tamsulosin (FLOMAX) 0.4 MG CAPS capsule, Take 1 capsule (0.4 mg total) by mouth daily after supper. (Patient not taking: Reported on 10/17/2020), Disp: 14 capsule, Rfl: 0  Past Medical History: Past Medical History:  Diagnosis Date  . CHF (congestive heart failure) ([place])   . Coronary artery disease   . Diabetes mellitus without complication ([place])   . History of kidney stones   . Hx MRSA infection   . Hyperlipidemia   . Hypertension   . Kidney stone   . Sleep apnea     Tobacco Use: Social History   Tobacco Use  Smoking Status Never Smoker  Smokeless Tobacco Never Used    Labs: Recent Review Flowsheet Data    Labs for ITP Cardiac and Pulmonary Rehab Latest Ref Rng & Units 08/30/2020   Hemoglobin A1c 4.8 - 5.6 % 8.3(H)       Exercise Target Goals: Exercise Program Goal: Individual exercise prescription set using results from initial 6 min walk test and THRR while considering  patient's activity barriers and safety.   Exercise Prescription Goal: Initial exercise prescription builds to 30-45 minutes a day of aerobic activity, 2-3 days  per week.  Home exercise guidelines will be given to patient during program as part of exercise prescription that the participant will acknowledge.   Education: Aerobic Exercise: - Group verbal and visual presentation on the components of exercise prescription. Introduces F.I.T.T principle from ACSM for exercise prescriptions.  Reviews F.I.T.T. principles of aerobic exercise including progression. Written material given at graduation. Flowsheet Row Cardiac Rehab from 09/15/2017 in [facility] Cardiac and Pulmonary Rehab  Date 05/31/17  Educator [facility]  Instruction Review Code 1- [employer] Understanding      Education:  Resistance Exercise: - Group verbal and visual presentation on the components of exercise prescription. Introduces F.I.T.T principle from ACSM for exercise prescriptions  Reviews F.I.T.T. principles of resistance exercise including progression. Written material given at graduation.    Education: Exercise & Equipment Safety: - Individual verbal instruction and demonstration of equipment use and safety with use of the equipment. Flowsheet Row Cardiac Rehab from 10/24/2020 in [facility] Cardiac and Pulmonary Rehab  Education need identified 10/24/20  Date 10/24/20  Educator [place]  Instruction Review Code 1- Verbalizes Understanding      Education: Exercise Physiology & General Exercise Guidelines: - Group verbal and written instruction with models to review the exercise physiology of the cardiovascular system and associated critical values. Provides general exercise guidelines with specific guidelines to those with heart or lung disease.  Flowsheet Row Cardiac Rehab from 09/15/2017 in [facility] Cardiac and Pulmonary Rehab  Date 09/15/17  Educator [facility]  Instruction Review Code 1- Verbalizes Understanding      Education: Flexibility, Balance, Mind/Body Relaxation: - Group verbal and visual presentation with interactive activity on the components of exercise prescription. Introduces F.I.T.T principle from ACSM for exercise prescriptions. Reviews F.I.T.T. principles of flexibility and balance exercise training including progression. Also discusses the mind body connection.  Reviews various relaxation techniques to help reduce and manage stress (i.e. Deep breathing, progressive muscle relaxation, and visualization). Balance handout provided to take home. Written material given at graduation. Flowsheet Row Cardiac Rehab from 09/15/2017 in [facility] Cardiac and Pulmonary Rehab  Date 06/02/17  Educator [facility]  Instruction Review Code 1- Verbalizes Understanding      Activity Barriers & Risk Stratification:  Activity  Barriers & Cardiac Risk Stratification - 10/24/20 1351      Activity Barriers & Cardiac Risk Stratification   Activity Barriers Arthritis;Deconditioning    Cardiac Risk Stratification High           6 Minute Walk:  6 Minute Walk    Row Name 10/24/20 1346         6 Minute Walk   Phase Initial     Distance 1250 feet     Walk Time 6 minutes     # of Rest Breaks 0     MPH 2.36     METS 2.33     RPE 13     Perceived Dyspnea  1     VO2 Peak 8.15     Symptoms Yes (comment)     Comments Hip pain 3/10     Resting HR 69 bpm     Resting BP 118/62     Resting Oxygen Saturation  97 %     Exercise Oxygen Saturation  during 6 min walk 98 %     Max Ex. HR 97 bpm     Max Ex. BP 154/64     2 Minute Post BP 128/66            Oxygen Initial Assessment:   Oxygen Re-Evaluation:  Oxygen Discharge (Final Oxygen Re-Evaluation):   Initial Exercise Prescription:  Initial Exercise Prescription - 10/24/20 1300      Date of Initial Exercise RX and Referring Provider   Date 10/24/20    Referring Provider Paraschos, Alexander MD      Treadmill   MPH 1.7    Grade 0    Minutes 15    METs 2.3      Recumbant Bike   Level 2    RPM 60    Watts 15    Minutes 15    METs 2.3      NuStep   Level 2    SPM 80    Minutes 15    METs 2.3      T5 Nustep   Level 1    SPM 80    Minutes 15    METs 2.3      Biostep-RELP   Level 1    SPM 50    Minutes 15    METs 2.3      Prescription Details   Frequency (times per week) 2    Duration Progress to 30 minutes of continuous aerobic without signs/symptoms of physical distress      Intensity   THRR 40-80% of Max Heartrate 106-144    Ratings of Perceived Exertion 11-13    Perceived Dyspnea 0-4      Progression   Progression Continue to progress workloads to maintain intensity without signs/symptoms of physical distress.      Resistance Training   Training Prescription Yes    Weight 3 lb    Reps 10-15           Perform  Capillary Blood Glucose checks as needed.  Exercise Prescription Changes:  Exercise Prescription Changes    Row Name 10/24/20 1300             Response to Exercise   Blood Pressure (Admit) 118/62       Blood Pressure (Exercise) 154/64       Blood Pressure (Exit) 128/66       Heart Rate (Admit) 69 bpm       Heart Rate (Exercise) 97 bpm       Heart Rate (Exit) 68 bpm       Oxygen Saturation (Admit) 97 %       Oxygen Saturation (Exercise) 98 %       Oxygen Saturation (Exit) 96 %       Rating of Perceived Exertion (Exercise) 13       Perceived Dyspnea (Exercise) 1       Symptoms hip pain 3/10       Comments walk test results              Exercise Comments:   Exercise Goals and Review:  Exercise Goals    Row Name 10/24/20 1351             Exercise Goals   Increase Physical Activity Yes       Intervention Provide advice, education, support and counseling about physical activity/exercise needs.;Develop an individualized exercise prescription for aerobic and resistive training based on initial evaluation findings, risk stratification, comorbidities and participant's personal goals.       Expected Outcomes Short Term: Attend rehab on a regular basis to increase amount of physical activity.;Long Term: Add in home exercise to make exercise part of routine and to increase amount of physical activity.;Long Term: Exercising regularly at least 3-5 days a week.  Increase Strength and Stamina Yes       Intervention Provide advice, education, support and counseling about physical activity/exercise needs.;Develop an individualized exercise prescription for aerobic and resistive training based on initial evaluation findings, risk stratification, comorbidities and participant's personal goals.       Expected Outcomes Short Term: Increase workloads from initial exercise prescription for resistance, speed, and METs.;Short Term: Perform resistance training exercises routinely during rehab  and add in resistance training at home;Long Term: Improve cardiorespiratory fitness, muscular endurance and strength as measured by increased METs and functional capacity (6MWT)       Able to understand and use rate of perceived exertion (RPE) scale Yes       Intervention Provide education and explanation on how to use RPE scale       Expected Outcomes Short Term: Able to use RPE daily in rehab to express subjective intensity level;Long Term:  Able to use RPE to guide intensity level when exercising independently       Able to understand and use Dyspnea scale Yes       Intervention Provide education and explanation on how to use Dyspnea scale       Expected Outcomes Short Term: Able to use Dyspnea scale daily in rehab to express subjective sense of shortness of breath during exertion;Long Term: Able to use Dyspnea scale to guide intensity level when exercising independently       Knowledge and understanding of Target Heart Rate Range (THRR) Yes       Intervention Provide education and explanation of THRR including how the numbers were predicted and where they are located for reference       Expected Outcomes Short Term: Able to state/look up THRR;Short Term: Able to use daily as guideline for intensity in rehab;Long Term: Able to use THRR to govern intensity when exercising independently       Able to check pulse independently Yes       Intervention Provide education and demonstration on how to check pulse in carotid and radial arteries.;Review the importance of being able to check your own pulse for safety during independent exercise       Expected Outcomes Short Term: Able to explain why pulse checking is important during independent exercise;Long Term: Able to check pulse independently and accurately       Understanding of Exercise Prescription Yes       Intervention Provide education, explanation, and written materials on patient's individual exercise prescription       Expected Outcomes Short  Term: Able to explain program exercise prescription;Long Term: Able to explain home exercise prescription to exercise independently              Exercise Goals Re-Evaluation :   Discharge Exercise Prescription (Final Exercise Prescription Changes):  Exercise Prescription Changes - 10/24/20 1300      Response to Exercise   Blood Pressure (Admit) 118/62    Blood Pressure (Exercise) 154/64    Blood Pressure (Exit) 128/66    Heart Rate (Admit) 69 bpm    Heart Rate (Exercise) 97 bpm    Heart Rate (Exit) 68 bpm    Oxygen Saturation (Admit) 97 %    Oxygen Saturation (Exercise) 98 %    Oxygen Saturation (Exit) 96 %    Rating of Perceived Exertion (Exercise) 13    Perceived Dyspnea (Exercise) 1    Symptoms hip pain 3/10    Comments walk test results  Nutrition:  Target Goals: Understanding of nutrition guidelines, daily intake of sodium 1500mg , cholesterol 200mg , calories 30% from fat and 7% or less from saturated fats, daily to have 5 or more servings of fruits and vegetables.  Education: All About Nutrition: -Group instruction provided by verbal, written material, interactive activities, discussions, models, and posters to present general guidelines for heart healthy nutrition including fat, fiber, MyPlate, the role of sodium in heart healthy nutrition, utilization of the nutrition label, and utilization of this knowledge for meal planning. Follow up email sent as well. Written material given at graduation. Flowsheet Row Cardiac Rehab from 09/15/2017 in [facility] Cardiac and Pulmonary Rehab  Date 09/06/17  Educator [facility]  Instruction Review Code 1- Verbalizes Understanding      Biometrics:  Pre Biometrics - 10/24/20 1351      Pre Biometrics   Height 5\' 10"  (1.778 m)    Weight 343 lb 1.6 oz (155.6 kg)    BMI (Calculated) 49.23    Single Leg Stand 18.7 seconds            Nutrition Therapy Plan and Nutrition Goals:   Nutrition Assessments:  MEDIFICTS Score  Key:  ?70 Need to make dietary changes   40-70 Heart Healthy Diet  ? 40 Therapeutic Level Cholesterol Diet   Picture Your Plate Scores:  [id number] Unhealthy dietary pattern with much room for improvement.  41-50 Dietary pattern unlikely to meet recommendations for good health and room for improvement.  51-60 More healthful dietary pattern, with some room for improvement.   >60 Healthy dietary pattern, although there may be some specific behaviors that could be improved.    Nutrition Goals Re-Evaluation:   Nutrition Goals Discharge (Final Nutrition Goals Re-Evaluation):   Psychosocial: Target Goals: Acknowledge presence or absence of significant depression and/or stress, maximize coping skills, provide positive support system. Participant is able to verbalize types and ability to use techniques and skills needed for reducing stress and depression.   Education: Stress, Anxiety, and Depression - Group verbal and visual presentation to define topics covered.  Reviews how body is impacted by stress, anxiety, and depression.  Also discusses healthy ways to reduce stress and to treat/manage anxiety and depression.  Written material given at graduation. Flowsheet Row Cardiac Rehab from 09/15/2017 in [facility] Cardiac and Pulmonary Rehab  Date 07/07/17  Educator [facility]  Instruction Review Code 1- [employer] Understanding      Education: Sleep Hygiene -Provides group verbal and written instruction about how sleep can affect your health.  Define sleep hygiene, discuss sleep cycles and impact of sleep habits. Review good sleep hygiene tips.    Initial Review & Psychosocial Screening:  Initial Psych Review & Screening - 10/17/20 0916      Initial Review   Current issues with None Identified      Family Dynamics   Good Support System? Yes   Wife and children     Barriers   Psychosocial barriers to participate in program There are no identifiable barriers or psychosocial needs.      Screening  Interventions   Interventions Encouraged to exercise    Expected Outcomes Short Term goal: Utilizing psychosocial counselor, staff and physician to assist with identification of specific Stressors or current issues interfering with healing process. Setting desired goal for each stressor or current issue identified.;Long Term Goal: Stressors or current issues are controlled or eliminated.;Short Term goal: Identification and review with participant of any Quality of Life or Depression concerns found by scoring the questionnaire.;Long Term goal: The  participant improves quality of Life and PHQ9 Scores as seen by post scores and/or verbalization of changes           Quality of Life Scores:   Quality of Life - 10/24/20 0942      Quality of Life   Select Quality of Life      Quality of Life Scores   Health/Function Pre 14.9 %    Socioeconomic Pre 22.5 %    Psych/Spiritual Pre 23.43 %    Family Pre 21.6 %    GLOBAL Pre 19.21 %          Scores of 19 and below usually indicate a poorer quality of life in these areas.  A difference of  2-3 points is a clinically meaningful difference.  A difference of 2-3 points in the total score of the Quality of Life Index has been associated with significant improvement in overall quality of life, self-image, physical symptoms, and general health in studies assessing change in quality of life.  PHQ-9: Recent Review Flowsheet Data    Depression screen [facility] 2/9 10/24/2020 12/28/2017 12/28/2017 09/28/2017 09/15/2017   Decreased Interest 1 0 0 0 1   Down, Depressed, Hopeless 0 0 0 0 0   PHQ - 2 Score 1 0 0 0 1   Altered sleeping 0 - - - 0   Tired, decreased energy 1 - - - 1   Change in appetite 2 - - - 1   Feeling bad or failure about yourself  0 - - - 1   Trouble concentrating 0 - - - 0   Moving slowly or fidgety/restless 0 - - - 0   Suicidal thoughts 0 - - - 0   PHQ-9 Score 4 - - - 4   Difficult doing work/chores Somewhat difficult - - - Not difficult at all      Interpretation of Total Score  Total Score Depression Severity:  1-4 = Minimal depression, 5-9 = Mild depression, 10-14 = Moderate depression, 15-19 = Moderately severe depression, 20-27 = Severe depression   Psychosocial Evaluation and Intervention:  Psychosocial Evaluation - 10/17/20 0937      Psychosocial Evaluation & Interventions   Comments Vernia Buff has no barriers to attending the program. He has been through the program before and has had several life changes since last attendance. Vernia Buff is retired and his wife is on disability for medical reason. He states that he does not have any more stress than the usual daily life stress. He lives with his wife and 2 teenage daughters, and male dog and cat. House full of women per Vernia Buff.  He plans to look for a part time job in the future for something to do. He has not been able to lose weight and is talking about how to manage weight loss. He is looking forward to attending the program.    Expected Outcomes STG Attend all scheduled sessions, meet with RD to learn steps toward weight loss LTG: COntinue to manage weight with the tools and resources provided during program, continue to work toward healty lifestyle and stress free life.    Continue Psychosocial Services  Follow up required by staff           Psychosocial Re-Evaluation:   Psychosocial Discharge (Final Psychosocial Re-Evaluation):   Vocational Rehabilitation: Provide vocational rehab assistance to qualifying candidates.   Vocational Rehab Evaluation & Intervention:  Vocational Rehab - 10/17/20 0919      Initial Vocational Rehab Evaluation & Intervention  Assessment shows need for Vocational Rehabilitation No           Education: Education Goals: Education classes will be provided on a variety of topics geared toward better understanding of heart health and risk factor modification. Participant will state understanding/return demonstration of topics presented as noted  by education test scores.  Learning Barriers/Preferences:  Learning Barriers/Preferences - 10/17/20 0919      Learning Barriers/Preferences   Learning Barriers None    Learning Preferences None           General Cardiac Education Topics:  AED/CPR: - Group verbal and written instruction with the use of models to demonstrate the basic use of the AED with the basic ABC's of resuscitation.   Anatomy and Cardiac Procedures: - Group verbal and visual presentation and models provide information about basic cardiac anatomy and function. Reviews the testing methods done to diagnose heart disease and the outcomes of the test results. Describes the treatment choices: Medical Management, Angioplasty, or Coronary Bypass Surgery for treating various heart conditions including Myocardial Infarction, Angina, Valve Disease, and Cardiac Arrhythmias.  Written material given at graduation. Flowsheet [facility]  Date 06/07/17  [facility]  Instruction Review Code 1- Verbalizes Understanding      Medication Safety: - Group verbal and visual instruction to review commonly prescribed medications for heart and lung disease. Reviews the medication, class of the drug, and side effects. Includes the steps to properly store meds and maintain the prescription regimen.  Written material given at graduation. Flowsheet Row [facility]  Date 06/21/17  Educator [facility]  Instruction Review Code 1- Verbalizes Understanding      Intimacy: - Group verbal instruction through game format to discuss how heart and lung disease can affect sexual intimacy. Written material given at graduation..   Know Your Numbers and Heart Failure: - Group verbal and visual instruction to discuss disease risk factors for cardiac and pulmonary disease and treatment options.  Reviews associated critical values for  Overweight/Obesity, Hypertension, Cholesterol, and Diabetes.  Discusses basics of heart failure: signs/symptoms and treatments.  Introduces Heart Failure Zone chart for action plan for heart failure.  Written material given at graduation. Flowsheet Row [facility]  Date 06/07/17  Educator [facility]  Instruction Review Code 1- Verbalizes Understanding      Infection Prevention: - Provides verbal and written material to individual with discussion of infection control including proper hand washing and proper equipment cleaning during exercise session. Flowsheet Row [facility]  Education need identified 10/24/20  Date 10/24/20  Educator [facility]  Instruction Review Code 1- Verbalizes Understanding      Falls Prevention: - Provides verbal and written material to individual with discussion of falls prevention and safety. Flowsheet [facility]  Education need identified 10/24/20  Date 10/24/20  Educator [facility]  Instruction Review Code 1- Verbalizes Understanding      Other: -Provides group and verbal instruction on various topics (see comments)   Knowledge Questionnaire Score:  Knowledge Questionnaire Score - 10/24/20 0933      Knowledge Questionnaire Score   Pre Score 22/26: Angina, Nitro, Exercise           Core Components/Risk Factors/Patient Goals at Admission:  Personal Goals and Risk Factors at Admission - 10/24/20 1352  Core Components/Risk Factors/Patient Goals on Admission    Weight Management Yes;Obesity;Weight Loss    Intervention Weight Management: Develop a combined nutrition and exercise program designed to reach desired caloric intake, while maintaining appropriate intake of nutrient and fiber, sodium and fats, and appropriate energy expenditure required for the weight goal.;Weight Management: Provide education and  appropriate resources to help participant work on and attain dietary goals.;Weight Management/Obesity: Establish reasonable short term and long term weight goals.;Obesity: Provide education and appropriate resources to help participant work on and attain dietary goals.    Admit Weight 343 lb (155.6 kg)    Goal Weight: Short Term 338 lb (153.3 kg)    Goal Weight: Long Term 323 lb (146.5 kg)    Expected Outcomes Short Term: Continue to assess and modify interventions until short term weight is achieved;Long Term: Adherence to nutrition and physical activity/exercise program aimed toward attainment of established weight goal;Weight Loss: Understanding of general recommendations for a balanced deficit meal plan, which promotes 1-2 lb weight loss per week and includes a negative energy balance of [phone] kcal/d    Diabetes Yes    Intervention Provide education about signs/symptoms and action to take for hypo/hyperglycemia.;Provide education about proper nutrition, including hydration, and aerobic/resistive exercise prescription along with prescribed medications to achieve blood glucose in normal ranges: Fasting glucose 65-99 mg/dL    Expected Outcomes Short Term: Participant verbalizes understanding of the signs/symptoms and immediate care of hyper/hypoglycemia, proper foot care and importance of medication, aerobic/resistive exercise and nutrition plan for blood glucose control.;Long Term: Attainment of HbA1C < 7%.    Heart Failure Yes    Intervention Provide a combined exercise and nutrition program that is supplemented with education, support and counseling about heart failure. Directed toward relieving symptoms such as shortness of breath, decreased exercise tolerance, and extremity edema.    Expected Outcomes Improve functional capacity of life;Short term: Attendance in program 2-3 days a week with increased exercise capacity. Reported lower sodium intake. Reported increased fruit and vegetable intake.  Reports medication compliance.;Short term: Daily weights obtained and reported for increase. Utilizing diuretic protocols set by physician.;Long term: Adoption of self-care skills and reduction of barriers for early signs and symptoms recognition and intervention leading to self-care maintenance.    Hypertension Yes    Intervention Provide education on lifestyle modifcations including regular physical activity/exercise, weight management, moderate sodium restriction and increased consumption of fresh fruit, vegetables, and low fat dairy, alcohol moderation, and smoking cessation.;Monitor prescription use compliance.    Expected Outcomes Short Term: Continued assessment and intervention until BP is < 140/8mm HG in hypertensive participants. < 130/45mm HG in hypertensive participants with diabetes, heart failure or chronic kidney disease.;Long Term: Maintenance of blood pressure at goal levels.    Lipids Yes    Intervention Provide education and support for participant on nutrition & aerobic/resistive exercise along with prescribed medications to achieve LDL 70mg , HDL >40mg .    Expected Outcomes Short Term: Participant states understanding of desired cholesterol values and is compliant with medications prescribed. Participant is following exercise prescription and nutrition guidelines.;Long Term: Cholesterol controlled with medications as prescribed, with individualized exercise RX and with personalized nutrition plan. Value goals: LDL < 70mg , HDL > 40 mg.           Education:Diabetes - Individual verbal and written instruction to review signs/symptoms of diabetes, desired ranges of glucose level fasting, after meals and with exercise. Acknowledge that pre and post exercise glucose checks will be done for 3 sessions at entry of  program. Flowsheet Row Cardiac Rehab from 10/24/2020 in [facility]  Education need identified 10/24/20  Date 10/24/20  Educator [place]  Instruction Review  Code 1- Verbalizes Understanding      Core Components/Risk Factors/Patient Goals Review:    Core Components/Risk Factors/Patient Goals at Discharge (Final Review):    ITP Comments:  ITP Comments    Row Name 10/17/20 0933 10/24/20 0932         ITP Comments Virtual orientation call completed today. he has an appointment on Date: 10/24/2020 for EP eval and gym Orientation.  Documentation of diagnosis [facility] be found in [facility]  Date: 09/27/20 and 07/19/20. Completed 6MWT and gym orientation. Initial ITP created and sent for review to Dr. Emily Filbert, Medical Director.             Comments: Initial ITP

## 2020-10-29 ENCOUNTER — Encounter: Payer: BC Managed Care – PPO | Admitting: *Deleted

## 2020-10-29 ENCOUNTER — Other Ambulatory Visit: Payer: Self-pay

## 2020-10-29 DIAGNOSIS — I5032 Chronic diastolic (congestive) heart failure: Secondary | ICD-10-CM | POA: Diagnosis not present

## 2020-10-29 LAB — GLUCOSE, CAPILLARY
Glucose-Capillary: 255 mg/dL — ABNORMAL HIGH (ref 70–99)
Glucose-Capillary: 200 mg/dL — ABNORMAL HIGH (ref 70–99)

## 2020-10-29 NOTE — Progress Notes (Signed)
Daily Session Note  Patient Details  Name: Philip Cook MRN: [id number] Date of Birth: 10/02/1960 Referring Provider:   Flowsheet Row Cardiac Rehab from 10/24/2020 in [facility] Cardiac and Pulmonary Rehab  Referring Provider Isaias Cowman MD      Encounter Date: 10/29/2020  Check In:  Session Check In - 10/29/20 1016      Check-In   Supervising physician immediately available to respond to emergencies See telemetry face sheet for immediately available ER MD    Location [facility]-Cardiac & Pulmonary Rehab    Staff Present Heath Lark, RN, BSN, Jacklynn Bue, MS Exercise Physiologist;Joseph Foy [place], [place], ACSM CEP, Exercise Physiologist    Virtual Visit No    Medication changes reported     No    Fall or balance concerns reported    No    Warm-up and Cool-down Performed on first and last piece of equipment    Resistance Training Performed Yes    VAD Patient? No    PAD/SET Patient? No      Pain Assessment   Currently in Pain? No/denies              Social History   Tobacco Use  Smoking Status Never Smoker  Smokeless Tobacco Never Used    Goals Met:  Exercise tolerated well Personal goals reviewed No report of cardiac concerns or symptoms  Goals Unmet:  Not Applicable  Comments: First full day of exercise!  Patient was oriented to gym and equipment including functions, settings, policies, and procedures.  Patient's individual exercise prescription and treatment plan were reviewed.  All starting workloads were established based on the results of the 6 minute walk test done at initial orientation visit.  The plan for exercise progression was also introduced and progression will be customized based on patient's performance and goals.    Dr. Emily Filbert is Medical Director for [place] and [facility].

## 2020-10-31 ENCOUNTER — Other Ambulatory Visit: Payer: Self-pay

## 2020-10-31 DIAGNOSIS — I5032 Chronic diastolic (congestive) heart failure: Secondary | ICD-10-CM

## 2020-10-31 LAB — GLUCOSE, CAPILLARY
Glucose-Capillary: 139 mg/dL — ABNORMAL HIGH (ref 70–99)
Glucose-Capillary: 103 mg/dL — ABNORMAL HIGH (ref 70–99)

## 2020-10-31 NOTE — Progress Notes (Signed)
Daily Session Note  Patient Details  Name: Philip Cook MRN: [id number] Date of Birth: [birth year] Referring Provider:   Flowsheet Row Cardiac Rehab from 10/24/2020 in [facility] Cardiac and Pulmonary Rehab  Referring Provider Isaias Cowman MD      Encounter Date: 10/31/2020  Check In:  Session Check In - 10/31/20 1000      Check-In   Supervising physician immediately available to respond to emergencies See telemetry face sheet for immediately available ER MD    Location [facility]-Cardiac & Pulmonary Rehab    Staff Present Birdie Sons, MPA, RN;Melissa Caiola RDN, Rowe Pavy, BA, ACSM CEP, Exercise Physiologist    Virtual Visit No    Medication changes reported     No    Fall or balance concerns reported    No    Tobacco Cessation No Change    Warm-up and Cool-down Performed on first and last piece of equipment    Resistance Training Performed Yes    VAD Patient? No    PAD/SET Patient? No      Pain Assessment   Currently in Pain? No/denies              Social History   Tobacco Use  Smoking Status Never Smoker  Smokeless Tobacco Never Used    Goals Met:  Independence with exercise equipment Exercise tolerated well No report of cardiac concerns or symptoms Strength training completed today  Goals Unmet:  Not Applicable  Comments: Pt able to follow exercise prescription today without complaint.  Will continue to monitor for progression.    Dr. Emily Filbert is Medical Director for [place] and [facility].

## 2020-11-05 ENCOUNTER — Other Ambulatory Visit: Payer: Self-pay

## 2020-11-05 ENCOUNTER — Encounter: Payer: BC Managed Care – PPO | Admitting: *Deleted

## 2020-11-05 DIAGNOSIS — I5032 Chronic diastolic (congestive) heart failure: Secondary | ICD-10-CM | POA: Diagnosis not present

## 2020-11-05 LAB — GLUCOSE, CAPILLARY
Glucose-Capillary: 257 mg/dL — ABNORMAL HIGH (ref 70–99)
Glucose-Capillary: 194 mg/dL — ABNORMAL HIGH (ref 70–99)

## 2020-11-05 NOTE — Progress Notes (Signed)
Daily Session Note  Patient Details  Name: Philip Cook MRN: [id number] Date of Birth: [birth year] Referring Provider:   Flowsheet Row Cardiac Rehab from 10/24/2020 in [facility] Cardiac and Pulmonary Rehab  Referring Provider Isaias Cowman MD      Encounter Date: 11/05/2020  Check In:  Session Check In - 11/05/20 1041      Check-In   Supervising physician immediately available to respond to emergencies See telemetry face sheet for immediately available ER MD    Location [facility]-Cardiac & Pulmonary Rehab    Staff Present Heath Lark, RN, BSN, CCRP;Amanda Sommer, BA, ACSM CEP, Exercise Physiologist;Kara Eliezer Bottom, MS Exercise Physiologist    Virtual Visit No    Medication changes reported     No    Fall or balance concerns reported    No    Tobacco Cessation Use Increase    Warm-up and Cool-down Performed on first and last piece of equipment    Resistance Training Performed Yes    VAD Patient? No    PAD/SET Patient? No      Pain Assessment   Currently in Pain? No/denies              Social History   Tobacco Use  Smoking Status Never Smoker  Smokeless Tobacco Never Used    Goals Met:  Independence with exercise equipment Exercise tolerated well No report of cardiac concerns or symptoms  Goals Unmet:  Not Applicable  Comments: Pt able to follow exercise prescription today without complaint.  Will continue to monitor for progression.    Dr. Emily Filbert is Medical Director for [place] and [facility].

## 2020-11-07 ENCOUNTER — Other Ambulatory Visit: Payer: Self-pay

## 2020-11-07 DIAGNOSIS — I5032 Chronic diastolic (congestive) heart failure: Secondary | ICD-10-CM | POA: Diagnosis not present

## 2020-11-07 NOTE — Progress Notes (Signed)
Daily Session Note  Patient Details  Name: Philip Cook MRN: [id number] Date of Birth: Aug 10, 1961 Referring Provider:   Flowsheet Row Cardiac Rehab from 10/24/2020 in [facility] Cardiac and Pulmonary Rehab  Referring Provider Isaias Cowman MD      Encounter Date: 11/07/2020  Check In:  Session Check In - 11/07/20 1016      Check-In   Supervising physician immediately available to respond to emergencies See telemetry face sheet for immediately available ER MD    Location [facility]-Cardiac & Pulmonary Rehab    Staff Present Birdie Sons, MPA, RN;Melissa Caiola RDN, Rowe Pavy, BA, ACSM CEP, Exercise Physiologist    Virtual Visit No    Medication changes reported     No    Fall or balance concerns reported    No    Tobacco Cessation No Change    Warm-up and Cool-down Performed on first and last piece of equipment    Resistance Training Performed Yes    VAD Patient? No    PAD/SET Patient? No      Pain Assessment   Currently in Pain? No/denies              Social History   Tobacco Use  Smoking Status Never Smoker  Smokeless Tobacco Never Used    Goals Met:  Independence with exercise equipment Exercise tolerated well No report of cardiac concerns or symptoms Strength training completed today  Goals Unmet:  Not Applicable  Comments: Pt able to follow exercise prescription today without complaint.  Will continue to monitor for progression.    Dr. Emily Filbert is Medical Director for [place] and [facility].

## 2020-11-13 ENCOUNTER — Encounter: Payer: Self-pay | Admitting: *Deleted

## 2020-11-13 DIAGNOSIS — I5032 Chronic diastolic (congestive) heart failure: Secondary | ICD-10-CM

## 2020-11-13 NOTE — Progress Notes (Signed)
Cardiac Individual Treatment Plan  Patient Details  Name: Philip Cook MRN: [id number] Date of Birth: 23-Jun-1961 Referring Provider:   Flowsheet Row Cardiac Rehab from 10/24/2020 in [facility] Cardiac and Pulmonary Rehab  Referring Provider Isaias Cowman MD      Initial Encounter Date:  Flowsheet Row Cardiac Rehab from 10/24/2020 in [facility] Cardiac and Pulmonary Rehab  Date 10/24/20      Visit Diagnosis: Chronic diastolic heart failure ([place])  Patient's Home Medications on Admission:  Current Outpatient Medications:  .  acetaminophen (TYLENOL) 500 MG tablet, Take 1,000 mg by mouth every 6 (six) hours as needed for moderate pain., Disp: , Rfl:  .  aspirin 325 MG tablet, Discuss with your outpatient provider about reducing or stopping this, since you are already on Effient which is a similar blood thinner. (Patient not taking: Reported on 10/17/2020), Disp: , Rfl:  .  aspirin EC 81 MG tablet, Take 81 mg by mouth daily. Swallow whole., Disp: , Rfl:  .  furosemide (LASIX) 20 MG tablet, Take 1 tablet (20 mg total) by mouth 2 (two) times daily. (Patient taking differently: Take 20 mg by mouth 2 (two) times daily.), Disp: 60 tablet, Rfl: 11 .  HUMULIN R 500 UNIT/ML injection, Inject into the skin. Use in insulin pump, Disp: , Rfl:  .  Insulin Human (INSULIN PUMP) SOLN, Inject into the skin. , Disp: , Rfl:  .  isosorbide mononitrate (IMDUR) 30 MG 24 hr tablet, Take 2 tablets (60 mg total) daily by mouth., Disp: 30 tablet, Rfl: 0 .  meloxicam (MOBIC) 15 MG tablet, Take 1 tablet (15 mg total) by mouth at bedtime as needed for pain., Disp: , Rfl:  .  Multiple Vitamin (MULTIVITAMIN WITH MINERALS) TABS tablet, Take 1 tablet by mouth daily., Disp: , Rfl:  .  nebivolol (BYSTOLIC) 10 MG tablet, Take 10 mg by mouth daily., Disp: , Rfl:  .  oxybutynin (DITROPAN XL) 10 MG 24 hr tablet, Take 1 tablet (10 mg total) by mouth daily as needed (bladder spasm/stent pain). (Patient not taking: Reported on  10/17/2020), Disp: 30 tablet, Rfl: 0 .  prasugrel (EFFIENT) 10 MG TABS tablet, Take 10 mg by mouth daily., Disp: , Rfl:  .  rosuvastatin (CRESTOR) 10 MG tablet, Take 1 tablet (10 mg total) by mouth daily., Disp: 30 tablet, Rfl: 0 .  tamsulosin (FLOMAX) 0.4 MG CAPS capsule, Take 1 capsule (0.4 mg total) by mouth daily after supper. (Patient not taking: Reported on 10/17/2020), Disp: 14 capsule, Rfl: 0  Past Medical History: Past Medical History:  Diagnosis Date  . CHF (congestive heart failure) ([place])   . Coronary artery disease   . Diabetes mellitus without complication ([place])   . History of kidney stones   . Hx MRSA infection   . Hyperlipidemia   . Hypertension   . Kidney stone   . Sleep apnea     Tobacco Use: Social History   Tobacco Use  Smoking Status Never Smoker  Smokeless Tobacco Never Used    Labs: Recent Review Flowsheet Data    Labs for ITP Cardiac and Pulmonary Rehab Latest Ref Rng & Units 08/30/2020   Hemoglobin A1c 4.8 - 5.6 % 8.3(H)       Exercise Target Goals: Exercise Program Goal: Individual exercise prescription set using results from initial 6 min walk test and THRR while considering  patient's activity barriers and safety.   Exercise Prescription Goal: Initial exercise prescription builds to 30-45 minutes a day of aerobic activity, 2-3 days  per week.  Home exercise guidelines will be given to patient during program as part of exercise prescription that the participant will acknowledge.   Education: Aerobic Exercise: - Group verbal and visual presentation on the components of exercise prescription. Introduces F.I.T.T principle from ACSM for exercise prescriptions.  Reviews F.I.T.T. principles of aerobic exercise including progression. Written material given at graduation. Flowsheet Row Cardiac Rehab from 11/07/2020 in [facility] Cardiac and Pulmonary Rehab  Date 11/07/20  Educator jh  Instruction Review Code 1- Verbalizes Understanding      Education:  Resistance Exercise: - Group verbal and visual presentation on the components of exercise prescription. Introduces F.I.T.T principle from ACSM for exercise prescriptions  Reviews F.I.T.T. principles of resistance exercise including progression. Written material given at graduation.    Education: Exercise & Equipment Safety: - Individual verbal instruction and demonstration of equipment use and safety with use of the equipment. Flowsheet Row Cardiac Rehab from 11/07/2020 in [facility] Cardiac and Pulmonary Rehab  Education need identified 10/24/20  Date 10/24/20  Educator [place]  Instruction Review Code 1- Verbalizes Understanding      Education: Exercise Physiology & General Exercise Guidelines: - Group verbal and written instruction with models to review the exercise physiology of the cardiovascular system and associated critical values. Provides general exercise guidelines with specific guidelines to those with heart or lung disease.  Flowsheet Row Cardiac Rehab from 11/07/2020 in [facility] Cardiac and Pulmonary Rehab  Date 10/31/20  Educator [facility]  Instruction Review Code 1- Verbalizes Understanding      Education: Flexibility, Balance, Mind/Body Relaxation: - Group verbal and visual presentation with interactive activity on the components of exercise prescription. Introduces F.I.T.T principle from ACSM for exercise prescriptions. Reviews F.I.T.T. principles of flexibility and balance exercise training including progression. Also discusses the mind body connection.  Reviews various relaxation techniques to help reduce and manage stress (i.e. Deep breathing, progressive muscle relaxation, and visualization). Balance handout provided to take home. Written material given at graduation. Flowsheet Row Cardiac Rehab from 09/15/2017 in [facility] Cardiac and Pulmonary Rehab  Date 06/02/17  Educator AS  Instruction Review Code 1- Verbalizes Understanding      Activity Barriers & Risk Stratification:  Activity  Barriers & Cardiac Risk Stratification - 10/24/20 1351      Activity Barriers & Cardiac Risk Stratification   Activity Barriers Arthritis;Deconditioning    Cardiac Risk Stratification High           6 Minute Walk:  6 Minute Walk    Row Name 10/24/20 1346         6 Minute Walk   Phase Initial     Distance 1250 feet     Walk Time 6 minutes     # of Rest Breaks 0     MPH 2.36     METS 2.33     RPE 13     Perceived Dyspnea  1     VO2 Peak 8.15     Symptoms Yes (comment)     Comments Hip pain 3/10     Resting HR 69 bpm     Resting BP 118/62     Resting Oxygen Saturation  97 %     Exercise Oxygen Saturation  during 6 min walk 98 %     Max Ex. HR 97 bpm     Max Ex. BP 154/64     2 Minute Post BP 128/66            Oxygen Initial Assessment:   Oxygen Re-Evaluation:  Oxygen Discharge (Final Oxygen Re-Evaluation):   Initial Exercise Prescription:  Initial Exercise Prescription - 10/24/20 1300      Date of Initial Exercise RX and Referring Provider   Date 10/24/20    Referring Provider Paraschos, Alexander MD      Treadmill   MPH 1.7    Grade 0    Minutes 15    METs 2.3      Recumbant Bike   Level 2    RPM 60    Watts 15    Minutes 15    METs 2.3      NuStep   Level 2    SPM 80    Minutes 15    METs 2.3      T5 Nustep   Level 1    SPM 80    Minutes 15    METs 2.3      Biostep-RELP   Level 1    SPM 50    Minutes 15    METs 2.3      Prescription Details   Frequency (times per week) 2    Duration Progress to 30 minutes of continuous aerobic without signs/symptoms of physical distress      Intensity   THRR 40-80% of Max Heartrate 106-144    Ratings of Perceived Exertion 11-13    Perceived Dyspnea 0-4      Progression   Progression Continue to progress workloads to maintain intensity without signs/symptoms of physical distress.      Resistance Training   Training Prescription Yes    Weight 3 lb    Reps 10-15           Perform  Capillary Blood Glucose checks as needed.  Exercise Prescription Changes:  Exercise Prescription Changes    Row Name 10/24/20 1300 11/04/20 1600           Response to Exercise   Blood Pressure (Admit) 118/62 142/70      Blood Pressure (Exercise) 154/64 178/88      Blood Pressure (Exit) 128/66 124/68      Heart Rate (Admit) 69 bpm 88 bpm      Heart Rate (Exercise) 97 bpm 102 bpm      Heart Rate (Exit) 68 bpm 82 bpm      Oxygen Saturation (Admit) 97 % --      Oxygen Saturation (Exercise) 98 % --      Oxygen Saturation (Exit) 96 % --      Rating of Perceived Exertion (Exercise) 13 12      Perceived Dyspnea (Exercise) 1 --      Symptoms hip pain 3/10 --      Comments walk test results second day exercise      Duration -- Continue with 30 min of aerobic exercise without signs/symptoms of physical distress.      Intensity -- THRR unchanged             Progression   Progression -- Continue to progress workloads to maintain intensity without signs/symptoms of physical distress.      Average METs -- 2.48             Resistance Training   Training Prescription -- Yes      Weight -- 3 lb      Reps -- 10-15             Treadmill   MPH -- 2      Grade -- 0      Minutes --  15      METs -- 2.76             T5 Nustep   Level -- 3      SPM -- 80      Minutes -- 15      METs -- 2.2             Exercise Comments:  Exercise Comments    Row Name 10/29/20 1017           Exercise Comments First full day of exercise!  Patient was oriented to gym and equipment including functions, settings, policies, and procedures.  Patient's individual exercise prescription and treatment plan were reviewed.  All starting workloads were established based on the results of the 6 minute walk test done at initial orientation visit.  The plan for exercise progression was also introduced and progression will be customized based on patient's performance and goals.              Exercise Goals and  Review:  Exercise Goals    Row Name 10/24/20 1351             Exercise Goals   Increase Physical Activity Yes       Intervention Provide advice, education, support and counseling about physical activity/exercise needs.;Develop an individualized exercise prescription for aerobic and resistive training based on initial evaluation findings, risk stratification, comorbidities and participant's personal goals.       Expected Outcomes Short Term: Attend rehab on a regular basis to increase amount of physical activity.;Long Term: Add in home exercise to make exercise part of routine and to increase amount of physical activity.;Long Term: Exercising regularly at least 3-5 days a week.       Increase Strength and Stamina Yes       Intervention Provide advice, education, support and counseling about physical activity/exercise needs.;Develop an individualized exercise prescription for aerobic and resistive training based on initial evaluation findings, risk stratification, comorbidities and participant's personal goals.       Expected Outcomes Short Term: Increase workloads from initial exercise prescription for resistance, speed, and METs.;Short Term: Perform resistance training exercises routinely during rehab and add in resistance training at home;Long Term: Improve cardiorespiratory fitness, muscular endurance and strength as measured by increased METs and functional capacity (6MWT)       Able to understand and use rate of perceived exertion (RPE) scale Yes       Intervention Provide education and explanation on how to use RPE scale       Expected Outcomes Short Term: Able to use RPE daily in rehab to express subjective intensity level;Long Term:  Able to use RPE to guide intensity level when exercising independently       Able to understand and use Dyspnea scale Yes       Intervention Provide education and explanation on how to use Dyspnea scale       Expected Outcomes Short Term: Able to use Dyspnea  scale daily in rehab to express subjective sense of shortness of breath during exertion;Long Term: Able to use Dyspnea scale to guide intensity level when exercising independently       Knowledge and understanding of Target Heart Rate Range (THRR) Yes       Intervention Provide education and explanation of THRR including how the numbers were predicted and where they are located for reference       Expected Outcomes Short Term: Able to state/look up THRR;Short Term: Able to use daily  as guideline for intensity in rehab;Long Term: Able to use THRR to govern intensity when exercising independently       Able to check pulse independently Yes       Intervention Provide education and demonstration on how to check pulse in carotid and radial arteries.;Review the importance of being able to check your own pulse for safety during independent exercise       Expected Outcomes Short Term: Able to explain why pulse checking is important during independent exercise;Long Term: Able to check pulse independently and accurately       Understanding of Exercise Prescription Yes       Intervention Provide education, explanation, and written materials on patient's individual exercise prescription       Expected Outcomes Short Term: Able to explain program exercise prescription;Long Term: Able to explain home exercise prescription to exercise independently              Exercise Goals Re-Evaluation :  Exercise Goals Re-Evaluation    Row Name 10/29/20 1017 11/04/20 1616           Exercise Goal Re-Evaluation   Exercise Goals Review Able to understand and use rate of perceived exertion (RPE) scale;Able to understand and use Dyspnea scale;Knowledge and understanding of Target Heart Rate Range (THRR);Understanding of Exercise Prescription Increase Physical Activity;Increase Strength and Stamina      Comments Reviewed RPE and dyspnea scales, THR and program prescription with pt today.  Pt voiced understanding and was  given a copy of goals to take home. [name] has tolerated exercise well in his first sessions.  He has increased TM to 2 mph. We will continue to monitor progress.      Expected Outcomes Short: Use RPE daily to regulate intensity. Long: Follow program prescription in THR. Short:  attend consistently Long:  improve overall MET level             Discharge Exercise Prescription (Final Exercise Prescription Changes):  Exercise Prescription Changes - 11/04/20 1600      Response to Exercise   Blood Pressure (Admit) 142/70    Blood Pressure (Exercise) 178/88    Blood Pressure (Exit) 124/68    Heart Rate (Admit) 88 bpm    Heart Rate (Exercise) 102 bpm    Heart Rate (Exit) 82 bpm    Rating of Perceived Exertion (Exercise) 12    Comments second day exercise    Duration Continue with 30 min of aerobic exercise without signs/symptoms of physical distress.    Intensity THRR unchanged      Progression   Progression Continue to progress workloads to maintain intensity without signs/symptoms of physical distress.    Average METs 2.48      Resistance Training   Training Prescription Yes    Weight 3 lb    Reps 10-15      Treadmill   MPH 2    Grade 0    Minutes 15    METs 2.76      T5 Nustep   Level 3    SPM 80    Minutes 15    METs 2.2           Nutrition:  Target Goals: Understanding of nutrition guidelines, daily intake of sodium '1500mg'$ , cholesterol '200mg'$ , calories 30% from fat and 7% or less from saturated fats, daily to have 5 or more servings of fruits and vegetables.  Education: All About Nutrition: -Group instruction provided by verbal, written material, interactive activities, discussions, models, and posters to  present general guidelines for heart healthy nutrition including fat, fiber, MyPlate, the role of sodium in heart healthy nutrition, utilization of the nutrition label, and utilization of this knowledge for meal planning. Follow up email sent as well. Written material  given at graduation. Flowsheet Row Cardiac Rehab from 09/15/2017 in [facility] Cardiac and Pulmonary Rehab  Date 09/06/17  Educator PI  Instruction Review Code 1- Verbalizes Understanding      Biometrics:  Pre Biometrics - 10/24/20 1351      Pre Biometrics   Height _0  (1.778 m)    Weight 343 lb 1.6 oz (155.6 kg)    BMI (Calculated) 49.23    Single Leg Stand 18.7 seconds            Nutrition Therapy Plan and Nutrition Goals:  Nutrition Therapy & Goals - 11/05/20 0848      Nutrition Therapy   Diet Heart healthy, low Na, diabetes friendly    Drug/Food Interactions Statins/Certain Fruits    Protein (specify units) 120g    Fiber 30 grams    Whole Grain Foods 3 servings    Saturated Fats 12 max. grams    Fruits and Vegetables 8 servings/day    Sodium 1.5 grams      Personal Nutrition Goals   Nutrition Goal ST: check in with feelings before, during, and after eating. create list of foods he enjoys. Try to drink more water instead of diet soda. LT: feel in control around food, prepare more meals and have snacks on hand.    Comments He is on insulin. He feels he is very picky - he doesn't like fish. he doesn't care for boiled or baked chicken - texture. He enjoys [place] food. he also likes diet mountain dew, but would like to drink more water even though he doesn't care for it. He has been trying to eat high protein. he counts his CHO. He only likes apples for fruit. He feels out of control with food. He feels that he is out of control with food and feels that he wants to make changes, but in the moment is hard. Discussed heart healthy eating and diabetes friendly eating.  Discussed mindful eating and eating more intuitively - leftovers are ok and its ok if meals aren't perfect. Asked him to provide list of foods he enjoys and is willing to try to help make a plan for him. Asked him to observe how he feels before, during, and after eating food and where he sits on the hunger scale to help  address feeling out of control around food. His wife is depressed and wants him to sit on the couch with her which causes him to midlessly eat - encouraged staying busy to avoid boredom and choosing lower calorie snacks when eating outside of hunger.      Intervention Plan   Intervention Prescribe, educate and counsel regarding individualized specific dietary modifications aiming towards targeted core components such as weight, hypertension, lipid management, diabetes, heart failure and other comorbidities.;Nutrition handout(s) given to patient.    Expected Outcomes Short Term Goal: Understand basic principles of dietary content, such as calories, fat, sodium, cholesterol and nutrients.;Short Term Goal: A plan has been developed with personal nutrition goals set during dietitian appointment.;Long Term Goal: Adherence to prescribed nutrition plan.           Nutrition Assessments:  MEDIFICTS Score Key:  ?70 Need to make dietary changes   40-70 Heart Healthy Diet  ? 40 Therapeutic Level Cholesterol Diet  Flowsheet Row Cardiac Rehab from 10/29/2020 in [facility] Cardiac and Pulmonary Rehab  Picture Your Plate Total Score on Admission 56     Picture Your Plate Scores:  <88 Unhealthy dietary pattern with much room for improvement.  41-50 Dietary pattern unlikely to meet recommendations for good health and room for improvement.  51-60 More healthful dietary pattern, with some room for improvement.   >60 Healthy dietary pattern, although there may be some specific behaviors that could be improved.    Nutrition Goals Re-Evaluation:   Nutrition Goals Discharge (Final Nutrition Goals Re-Evaluation):   Psychosocial: Target Goals: Acknowledge presence or absence of significant depression and/or stress, maximize coping skills, provide positive support system. Participant is able to verbalize types and ability to use techniques and skills needed for reducing stress and depression.   Education:  Stress, Anxiety, and Depression - Group verbal and visual presentation to define topics covered.  Reviews how body is impacted by stress, anxiety, and depression.  Also discusses healthy ways to reduce stress and to treat/manage anxiety and depression.  Written material given at graduation. Flowsheet Row Cardiac Rehab from 09/15/2017 in [facility] Cardiac and Pulmonary Rehab  Date 07/07/17  Educator [facility]  Instruction Review Code 1- [employer] Understanding      Education: Sleep Hygiene -Provides group verbal and written instruction about how sleep can affect your health.  Define sleep hygiene, discuss sleep cycles and impact of sleep habits. Review good sleep hygiene tips.    Initial Review & Psychosocial Screening:  Initial Psych Review & Screening - 10/17/20 0916      Initial Review   Current issues with None Identified      Family Dynamics   Good Support System? Yes   Wife and children     Barriers   Psychosocial barriers to participate in program There are no identifiable barriers or psychosocial needs.      Screening Interventions   Interventions Encouraged to exercise    Expected Outcomes Short Term goal: Utilizing psychosocial counselor, staff and physician to assist with identification of specific Stressors or current issues interfering with healing process. Setting desired goal for each stressor or current issue identified.;Long Term Goal: Stressors or current issues are controlled or eliminated.;Short Term goal: Identification and review with participant of any Quality of Life or Depression concerns found by scoring the questionnaire.;Long Term goal: The participant improves quality of Life and PHQ9 Scores as seen by post scores and/or verbalization of changes           Quality of Life Scores:   Quality of Life - 10/24/20 0942      Quality of Life   Select Quality of Life      Quality of Life Scores   Health/Function Pre 14.9 %    Socioeconomic Pre 22.5 %     Psych/Spiritual Pre 23.43 %    Family Pre 21.6 %    GLOBAL Pre 19.21 %          Scores of 19 and below usually indicate a poorer quality of life in these areas.  A difference of  2-3 points is a clinically meaningful difference.  A difference of 2-3 points in the total score of the Quality of Life Index has been associated with significant improvement in overall quality of life, self-image, physical symptoms, and general health in studies assessing change in quality of life.  PHQ-9: Recent Review Flowsheet Data    Depression screen [facility] 2/9 10/24/2020 12/28/2017 12/28/2017 09/28/2017 09/15/2017   Decreased Interest 1  0 0 0 1   Down, Depressed, Hopeless 0 0 0 0 0   PHQ - 2 Score 1 0 0 0 1   Altered sleeping 0 - - - 0   Tired, decreased energy 1 - - - 1   Change in appetite 2 - - - 1   Feeling bad or failure about yourself  0 - - - 1   Trouble concentrating 0 - - - 0   Moving slowly or fidgety/restless 0 - - - 0   Suicidal thoughts 0 - - - 0   PHQ-9 Score 4 - - - 4   Difficult doing work/chores Somewhat difficult - - - Not difficult at all     Interpretation of Total Score  Total Score Depression Severity:  1-4 = Minimal depression, 5-9 = Mild depression, 10-14 = Moderate depression, 15-19 = Moderately severe depression, 20-27 = Severe depression   Psychosocial Evaluation and Intervention:  Psychosocial Evaluation - 10/17/20 0937      Psychosocial Evaluation & Interventions   Comments [name] has no barriers to attending the program. He has been through the program before and has had several life changes since last attendance. [name] is retired and his wife is on disability for medical reason. He states that he does not have any more stress than the usual daily life stress. He lives with his wife and 2 teenage daughters, and male dog and cat. House full of women per [name].  He plans to look for a part time job in the future for something to do. He has not been able to lose weight and is talking  about how to manage weight loss. He is looking forward to attending the program.    Expected Outcomes STG Attend all scheduled sessions, meet with RD to learn steps toward weight loss LTG: COntinue to manage weight with the tools and resources provided during program, continue to work toward healty lifestyle and stress free life.    Continue Psychosocial Services  Follow up required by staff           Psychosocial Re-Evaluation:   Psychosocial Discharge (Final Psychosocial Re-Evaluation):   Vocational Rehabilitation: Provide vocational rehab assistance to qualifying candidates.   Vocational Rehab Evaluation & Intervention:  Vocational Rehab - 10/17/20 0919      Initial Vocational Rehab Evaluation & Intervention   Assessment shows need for Vocational Rehabilitation No           Education: Education Goals: Education classes will be provided on a variety of topics geared toward better understanding of heart health and risk factor modification. Participant will state understanding/return demonstration of topics presented as noted by education test scores.  Learning Barriers/Preferences:  Learning Barriers/Preferences - 10/17/20 0919      Learning Barriers/Preferences   Learning Barriers None    Learning Preferences None           General Cardiac Education Topics:  AED/CPR: - Group verbal and written instruction with the use of models to demonstrate the basic use of the AED with the basic ABC's of resuscitation.   Anatomy and Cardiac Procedures: - Group verbal and visual presentation and models provide information about basic cardiac anatomy and function. Reviews the testing methods done to diagnose heart disease and the outcomes of the test results. Describes the treatment choices: Medical Management, Angioplasty, or Coronary Bypass Surgery for treating various heart conditions including Myocardial Infarction, Angina, Valve Disease, and Cardiac Arrhythmias.  Written  material given at graduation. Flowsheet Row Cardiac  Rehab from 09/15/2017 in [facility] Cardiac and Pulmonary Rehab  Date 06/07/17  Educator [facility]  Instruction Review Code 1- Verbalizes Understanding      Medication Safety: - Group verbal and visual instruction to review commonly prescribed medications for heart and lung disease. Reviews the medication, class of the drug, and side effects. Includes the steps to properly store meds and maintain the prescription regimen.  Written material given at graduation. Flowsheet Row Cardiac Rehab from 09/15/2017 in [facility] Cardiac and Pulmonary Rehab  Date 06/21/17  Educator [facility]  Instruction Review Code 1- Verbalizes Understanding      Intimacy: - Group verbal instruction through game format to discuss how heart and lung disease can affect sexual intimacy. Written material given at graduation.. Flowsheet Row Cardiac Rehab from 11/07/2020 in [facility] Cardiac and Pulmonary Rehab  Date 11/07/20  Educator jh  Instruction Review Code 1- Verbalizes Understanding      Know Your Numbers and Heart Failure: - Group verbal and visual instruction to discuss disease risk factors for cardiac and pulmonary disease and treatment options.  Reviews associated critical values for Overweight/Obesity, Hypertension, Cholesterol, and Diabetes.  Discusses basics of heart failure: signs/symptoms and treatments.  Introduces Heart Failure Zone chart for action plan for heart failure.  Written material given at graduation. Flowsheet Row Cardiac Rehab from 09/15/2017 in [facility] Cardiac and Pulmonary Rehab  Date 06/07/17  Educator [facility]  Instruction Review Code 1- Verbalizes Understanding      Infection Prevention: - Provides verbal and written material to individual with discussion of infection control including proper hand washing and proper equipment cleaning during exercise session. Flowsheet Row Cardiac Rehab from 11/07/2020 in [facility] Cardiac and Pulmonary Rehab  Education need identified  10/24/20  Date 10/24/20  Educator KL  Instruction Review Code 1- Verbalizes Understanding      Falls Prevention: - Provides verbal and written material to individual with discussion of falls prevention and safety. Flowsheet Row Cardiac Rehab from 11/07/2020 in [facility] Cardiac and Pulmonary Rehab  Education need identified 10/24/20  Date 10/24/20  Educator KL  Instruction Review Code 1- Verbalizes Understanding      Other: -Provides group and verbal instruction on various topics (see comments)   Knowledge Questionnaire Score:  Knowledge Questionnaire Score - 10/24/20 0933      Knowledge Questionnaire Score   Pre Score 22/26: Angina, Nitro, Exercise           Core Components/Risk Factors/Patient Goals at Admission:  Personal Goals and Risk Factors at Admission - 10/24/20 1352      Core Components/Risk Factors/Patient Goals on Admission    Weight Management Yes;Obesity;Weight Loss    Intervention Weight Management: Develop a combined nutrition and exercise program designed to reach desired caloric intake, while maintaining appropriate intake of nutrient and fiber, sodium and fats, and appropriate energy expenditure required for the weight goal.;Weight Management: Provide education and appropriate resources to help participant work on and attain dietary goals.;Weight Management/Obesity: Establish reasonable short term and long term weight goals.;Obesity: Provide education and appropriate resources to help participant work on and attain dietary goals.    Admit Weight 343 lb (155.6 kg)    Goal Weight: Short Term 338 lb (153.3 kg)    Goal Weight: Long Term 323 lb (146.5 kg)    Expected Outcomes Short Term: Continue to assess and modify interventions until short term weight is achieved;Long Term: Adherence to nutrition and physical activity/exercise program aimed toward attainment of established weight goal;Weight Loss: Understanding of general recommendations for a balanced deficit  meal  plan, which promotes 1-2 lb weight loss per week and includes a negative energy balance of [phone] kcal/d    Diabetes Yes    Intervention Provide education about signs/symptoms and action to take for hypo/hyperglycemia.;Provide education about proper nutrition, including hydration, and aerobic/resistive exercise prescription along with prescribed medications to achieve blood glucose in normal ranges: Fasting glucose 65-99 mg/dL    Expected Outcomes Short Term: Participant verbalizes understanding of the signs/symptoms and immediate care of hyper/hypoglycemia, proper foot care and importance of medication, aerobic/resistive exercise and nutrition plan for blood glucose control.;Long Term: Attainment of HbA1C < 7%.    Heart Failure Yes    Intervention Provide a combined exercise and nutrition program that is supplemented with education, support and counseling about heart failure. Directed toward relieving symptoms such as shortness of breath, decreased exercise tolerance, and extremity edema.    Expected Outcomes Improve functional capacity of life;Short term: Attendance in program 2-3 days a week with increased exercise capacity. Reported lower sodium intake. Reported increased fruit and vegetable intake. Reports medication compliance.;Short term: Daily weights obtained and reported for increase. Utilizing diuretic protocols set by physician.;Long term: Adoption of self-care skills and reduction of barriers for early signs and symptoms recognition and intervention leading to self-care maintenance.    Hypertension Yes    Intervention Provide education on lifestyle modifcations including regular physical activity/exercise, weight management, moderate sodium restriction and increased consumption of fresh fruit, vegetables, and low fat dairy, alcohol moderation, and smoking cessation.;Monitor prescription use compliance.    Expected Outcomes Short Term: Continued assessment and intervention until BP is <  140/19m HG in hypertensive participants. < 130/81mHG in hypertensive participants with diabetes, heart failure or chronic kidney disease.;Long Term: Maintenance of blood pressure at goal levels.    Lipids Yes    Intervention Provide education and support for participant on nutrition & aerobic/resistive exercise along with prescribed medications to achieve LDL <7032mHDL >34m51m  Expected Outcomes Short Term: Participant states understanding of desired cholesterol values and is compliant with medications prescribed. Participant is following exercise prescription and nutrition guidelines.;Long Term: Cholesterol controlled with medications as prescribed, with individualized exercise RX and with personalized nutrition plan. Value goals: LDL < 70mg68mL > 40 mg.           Education:Diabetes - Individual verbal and written instruction to review signs/symptoms of diabetes, desired ranges of glucose level fasting, after meals and with exercise. Acknowledge that pre and post exercise glucose checks will be done for 3 sessions at entry of program. [place] [facility]  Education need identified 10/24/20  Date 10/24/20  Educator KL  ICassvilletruction Review Code 1- Verbalizes Understanding      Core Components/Risk Factors/Patient Goals Review:    Core Components/Risk Factors/Patient Goals at Discharge (Final Review):    ITP Comments:  ITP Comments    Row Name 10/17/20 0933 10/24/20 0932 10/29/20 1017 11/13/20 0640     ITP Comments Virtual orientation call completed today. he has an appointment on Date: 10/24/2020 for EP eval and gym Orientation.  [facility]. Completed 6MWT and gym orientation. Initial ITP created and sent for review to Dr. Mark Emily Filbertical Director. First full day of exercise!  Patient was oriented to gym and equipment including functions, settings, policies,  and procedures.  Patient's individual exercise prescription and treatment plan were reviewed.  All starting workloads were established  based on the results of the 6 minute walk test done at initial orientation visit.  The plan for exercise progression was also introduced and progression will be customized based on patient's performance and goals. 30 Day review completed. Medical Director ITP review done, changes made as directed, and signed approval by Medical Director.           Comments:

## 2020-11-14 ENCOUNTER — Other Ambulatory Visit: Payer: Self-pay

## 2020-11-14 DIAGNOSIS — I5032 Chronic diastolic (congestive) heart failure: Secondary | ICD-10-CM | POA: Diagnosis not present

## 2020-11-14 NOTE — Progress Notes (Signed)
Daily Session Note  Patient Details  Name: Philip Cook MRN: [id number] Date of Birth: 07-25-1961 Referring Provider:   Flowsheet Row Cardiac Rehab from 10/24/2020 in [facility] Cardiac and Pulmonary Rehab  Referring Provider Isaias Cowman MD      Encounter Date: 11/14/2020  Check In:  Session Check In - 11/14/20 0945      Check-In   Supervising physician immediately available to respond to emergencies See telemetry face sheet for immediately available ER MD    Location [facility]-Cardiac & Pulmonary Rehab    Staff Present Birdie Sons, MPA, RN;Melissa Caiola RDN, Rowe Pavy, BA, ACSM CEP, Exercise Physiologist    Virtual Visit No    Medication changes reported     No    Fall or balance concerns reported    No    Tobacco Cessation No Change    Warm-up and Cool-down Performed on first and last piece of equipment    Resistance Training Performed Yes    VAD Patient? No    PAD/SET Patient? No      Pain Assessment   Currently in Pain? No/denies              Social History   Tobacco Use  Smoking Status Never Smoker  Smokeless Tobacco Never Used    Goals Met:  Independence with exercise equipment Exercise tolerated well No report of cardiac concerns or symptoms Strength training completed today  Goals Unmet:  Not Applicable  Comments: Pt able to follow exercise prescription today without complaint.  Will continue to monitor for progression.  Reviewed home exercise with pt today.  Pt plans to walk and use staff videos for exercise.  He and his wife are also planning to join [employer].  Reviewed THR, pulse, RPE, sign and symptoms, pulse oximetery and when to call 911 or MD.  Also discussed weather considerations and indoor options.  Pt voiced understanding.    Dr. Emily Filbert is Medical Director for [place] [facility].

## 2020-11-19 ENCOUNTER — Other Ambulatory Visit: Payer: Self-pay

## 2020-11-19 ENCOUNTER — Encounter: Payer: BC Managed Care – PPO | Attending: Cardiology | Admitting: *Deleted

## 2020-11-19 DIAGNOSIS — I5032 Chronic diastolic (congestive) heart failure: Secondary | ICD-10-CM | POA: Insufficient documentation

## 2020-11-19 NOTE — Progress Notes (Signed)
Daily Session Note  Patient Details  Name: Philip Cook MRN: [id number] Date of Birth: 1960/11/10 Referring Provider:   Flowsheet Row Cardiac Rehab from 10/24/2020 in [facility] Cardiac and Pulmonary Rehab  Referring Provider Isaias Cowman MD      Encounter Date: 11/19/2020  Check In:  Session Check In - 11/19/20 1012      Check-In   Supervising physician immediately available to respond to emergencies See telemetry face sheet for immediately available ER MD    Location [facility]-Cardiac & Pulmonary Rehab    Staff Present Heath Lark, RN, BSN, CCRP;Amanda Sommer, BA, ACSM CEP, Exercise Physiologist;Kara Eliezer Bottom, MS Exercise Physiologist    Virtual Visit No    Medication changes reported     No    Fall or balance concerns reported    No    Warm-up and Cool-down Performed on first and last piece of equipment    Resistance Training Performed Yes    VAD Patient? No    PAD/SET Patient? No      Pain Assessment   Currently in Pain? No/denies              Social History   Tobacco Use  Smoking Status Never Smoker  Smokeless Tobacco Never Used    Goals Met:  Independence with exercise equipment Exercise tolerated well No report of cardiac concerns or symptoms  Goals Unmet:  Not Applicable  Comments: Pt able to follow exercise prescription today without complaint.  Will continue to monitor for progression.    Dr. Emily Filbert is Medical Director for [place] and [facility].

## 2020-11-23 ENCOUNTER — Other Ambulatory Visit: Payer: Self-pay

## 2020-11-23 ENCOUNTER — Emergency Department: Payer: BC Managed Care – PPO

## 2020-11-23 ENCOUNTER — Observation Stay
Admission: EM | Admit: 2020-11-23 | Discharge: 2020-11-23 | Disposition: A | Discharge status: Home / Self Care | Payer: BC Managed Care – PPO | Attending: Internal Medicine | Admitting: Internal Medicine

## 2020-11-23 DIAGNOSIS — R0789 Other chest pain: Secondary | ICD-10-CM

## 2020-11-23 DIAGNOSIS — Z9861 Coronary angioplasty status: Secondary | ICD-10-CM | POA: Diagnosis not present

## 2020-11-23 DIAGNOSIS — Z20822 Contact with and (suspected) exposure to covid-19: Secondary | ICD-10-CM | POA: Diagnosis not present

## 2020-11-23 DIAGNOSIS — E119 Type 2 diabetes mellitus without complications: Secondary | ICD-10-CM

## 2020-11-23 DIAGNOSIS — Z794 Long term (current) use of insulin: Secondary | ICD-10-CM | POA: Diagnosis not present

## 2020-11-23 DIAGNOSIS — Z79899 Other long term (current) drug therapy: Secondary | ICD-10-CM | POA: Insufficient documentation

## 2020-11-23 DIAGNOSIS — I248 Other forms of acute ischemic heart disease: Secondary | ICD-10-CM

## 2020-11-23 DIAGNOSIS — I5033 Acute on chronic diastolic (congestive) heart failure: Secondary | ICD-10-CM | POA: Insufficient documentation

## 2020-11-23 DIAGNOSIS — I2510 Atherosclerotic heart disease of native coronary artery without angina pectoris: Secondary | ICD-10-CM | POA: Diagnosis not present

## 2020-11-23 DIAGNOSIS — R778 Other specified abnormalities of plasma proteins: Secondary | ICD-10-CM | POA: Diagnosis not present

## 2020-11-23 DIAGNOSIS — G4733 Obstructive sleep apnea (adult) (pediatric): Secondary | ICD-10-CM | POA: Diagnosis present

## 2020-11-23 DIAGNOSIS — I110 Hypertensive heart disease with heart failure: Secondary | ICD-10-CM | POA: Insufficient documentation

## 2020-11-23 DIAGNOSIS — I10 Essential (primary) hypertension: Secondary | ICD-10-CM | POA: Diagnosis present

## 2020-11-23 DIAGNOSIS — I4891 Unspecified atrial fibrillation: Principal | ICD-10-CM | POA: Diagnosis present

## 2020-11-23 DIAGNOSIS — I5032 Chronic diastolic (congestive) heart failure: Secondary | ICD-10-CM | POA: Diagnosis present

## 2020-11-23 DIAGNOSIS — Z7982 Long term (current) use of aspirin: Secondary | ICD-10-CM | POA: Diagnosis not present

## 2020-11-23 LAB — HEPARIN LEVEL (UNFRACTIONATED): Heparin Unfractionated: 0.1 IU/mL — ABNORMAL LOW (ref 0.30–0.70)

## 2020-11-23 LAB — BASIC METABOLIC PANEL
Anion gap: 10 (ref 5–15)
BUN: 16 mg/dL (ref 6–20)
CO2: 24 mmol/L (ref 22–32)
Calcium: 9.8 mg/dL (ref 8.9–10.3)
Chloride: 107 mmol/L (ref 98–111)
Creatinine, Ser: 0.91 mg/dL (ref 0.61–1.24)
GFR, Estimated: >60 mL/min (ref >60)
Glucose, Bld: 212 mg/dL — ABNORMAL HIGH (ref 70–99)
Potassium: 3.5 mmol/L (ref 3.5–5.1)
Sodium: 141 mmol/L (ref 135–145)

## 2020-11-23 LAB — RESP PANEL BY RT-PCR (FLU A&B, COVID) ARPGX2
Influenza A by PCR: NEGATIVE
Influenza B by PCR: NEGATIVE
SARS Coronavirus 2 by RT PCR: NEGATIVE

## 2020-11-23 LAB — CBC
HCT: 45.7 % (ref 39.0–52.0)
Hemoglobin: 15.4 g/dL (ref 13.0–17.0)
MCH: 31.5 pg (ref 26.0–34.0)
MCHC: 33.7 g/dL (ref 30.0–36.0)
MCV: 93.5 fL (ref 80.0–100.0)
Platelets: 204 10*3/uL (ref 150–400)
RBC: 4.89 MIL/uL (ref 4.22–5.81)
RDW: 12.7 % (ref 11.5–15.5)
WBC: 8.8 10*3/uL (ref 4.0–10.5)
nRBC: 0 % (ref 0.0–0.2)

## 2020-11-23 LAB — HEMOGLOBIN A1C
Hgb A1c MFr Bld: 8.2 % — ABNORMAL HIGH (ref 4.8–5.6)
Mean Plasma Glucose: 188.64 mg/dL

## 2020-11-23 LAB — HEPATIC FUNCTION PANEL
ALT: 27 U/L (ref 0–44)
AST: 30 U/L (ref 15–41)
Albumin: 3.8 g/dL (ref 3.5–5.0)
Alkaline Phosphatase: 73 U/L (ref 38–126)
Bilirubin, Direct: <0.1 mg/dL (ref 0.0–0.2)
Total Bilirubin: 0.6 mg/dL (ref 0.3–1.2)
Total Protein: 7.2 g/dL (ref 6.5–8.1)

## 2020-11-23 LAB — BRAIN NATRIURETIC PEPTIDE: B Natriuretic Peptide: 15.4 pg/mL (ref 0.0–100.0)

## 2020-11-23 LAB — TROPONIN I (HIGH SENSITIVITY)
Troponin I (High Sensitivity): 73 ng/L — ABNORMAL HIGH (ref <18)
Troponin I (High Sensitivity): 325 ng/L (ref <18)
Troponin I (High Sensitivity): 490 ng/L (ref <18)

## 2020-11-23 LAB — CBG MONITORING, ED: Glucose-Capillary: 169 mg/dL — ABNORMAL HIGH (ref 70–99)

## 2020-11-23 LAB — APTT: aPTT: 29 seconds (ref 24–36)

## 2020-11-23 LAB — PROTIME-INR
INR: 1 (ref 0.8–1.2)
Prothrombin Time: 12.6 seconds (ref 11.4–15.2)

## 2020-11-23 LAB — MAGNESIUM: Magnesium: 2.1 mg/dL (ref 1.7–2.4)

## 2020-11-23 LAB — LIPASE, BLOOD: Lipase: 33 U/L (ref 11–51)

## 2020-11-23 MED ORDER — ONDANSETRON HCL 4 MG/2ML IJ SOLN: 4.0000 mg | Freq: Four times a day (QID) | INTRAMUSCULAR | Status: DC | PRN

## 2020-11-23 MED ORDER — HEPARIN BOLUS VIA INFUSION
4000.0000 [IU] | Freq: Once | INTRAVENOUS | Status: AC
  Administered 2020-11-23: 4000 [IU] via INTRAVENOUS
  Filled 2020-11-23: qty 4000

## 2020-11-23 MED ORDER — DILTIAZEM HCL 25 MG/5ML IV SOLN
25.0000 mg | INTRAVENOUS | Status: AC
  Administered 2020-11-23: 25 mg via INTRAVENOUS
  Filled 2020-11-23: qty 5

## 2020-11-23 MED ORDER — DILTIAZEM HCL ER COATED BEADS 180 MG PO CP24
180.0000 mg | ORAL_CAPSULE | Freq: Every day | ORAL | Status: DC
  Administered 2020-11-23: 180 mg via ORAL
  Filled 2020-11-23: qty 1

## 2020-11-23 MED ORDER — HEPARIN (PORCINE) 25000 UT/250ML-% IV SOLN
1600.0000 [IU]/h | INTRAVENOUS | Status: DC
  Administered 2020-11-23: 1300 [IU]/h via INTRAVENOUS
  Filled 2020-11-23: qty 250

## 2020-11-23 MED ORDER — HEPARIN BOLUS VIA INFUSION
2900.0000 [IU] | Freq: Once | INTRAVENOUS | Status: DC
  Filled 2020-11-23: qty 2900

## 2020-11-23 MED ORDER — ASPIRIN EC 81 MG PO TBEC
81.0000 mg | DELAYED_RELEASE_TABLET | Freq: Every day | ORAL | Status: DC
  Administered 2020-11-23: 81 mg via ORAL
  Filled 2020-11-23: qty 1

## 2020-11-23 MED ORDER — APIXABAN 5 MG PO TABS
5.0000 mg | ORAL_TABLET | Freq: Two times a day (BID) | ORAL | Status: DC
  Administered 2020-11-23: 5 mg via ORAL
  Filled 2020-11-23: qty 1

## 2020-11-23 MED ORDER — INSULIN ASPART 100 UNIT/ML ~~LOC~~ SOLN: 0.0000 [IU] | Freq: Three times a day (TID) | SUBCUTANEOUS | Status: DC

## 2020-11-23 MED ORDER — DILTIAZEM HCL ER COATED BEADS 180 MG PO CP24: 180.0000 mg | ORAL_CAPSULE | Freq: Every day | ORAL | 0 refills | Status: DC

## 2020-11-23 MED ORDER — INSULIN ASPART 100 UNIT/ML ~~LOC~~ SOLN: 0.0000 [IU] | Freq: Every day | SUBCUTANEOUS | Status: DC

## 2020-11-23 MED ORDER — DILTIAZEM HCL 60 MG PO TABS
30.0000 mg | ORAL_TABLET | Freq: Four times a day (QID) | ORAL | Status: DC
  Administered 2020-11-23: 30 mg via ORAL
  Filled 2020-11-23: qty 1

## 2020-11-23 MED ORDER — DILTIAZEM HCL 60 MG PO TABS
120.0000 mg | ORAL_TABLET | ORAL | Status: AC
  Administered 2020-11-23: 120 mg via ORAL
  Filled 2020-11-23: qty 2

## 2020-11-23 MED ORDER — DILTIAZEM HCL 25 MG/5ML IV SOLN
15.0000 mg | INTRAVENOUS | Status: AC
  Administered 2020-11-23: 15 mg via INTRAVENOUS
  Filled 2020-11-23: qty 5

## 2020-11-23 MED ORDER — FUROSEMIDE 20 MG PO TABS: 20.0000 mg | ORAL_TABLET | Freq: Every day | ORAL | 0 refills | Status: DC

## 2020-11-23 MED ORDER — APIXABAN 5 MG PO TABS: 5.0000 mg | ORAL_TABLET | Freq: Two times a day (BID) | ORAL | 0 refills | Status: DC

## 2020-11-23 MED ORDER — ACETAMINOPHEN 325 MG PO TABS: 650.0000 mg | ORAL_TABLET | ORAL | Status: DC | PRN

## 2020-11-23 NOTE — ED Notes (Signed)
Cardiologist MD at bedside when RN entered to complete heparin rate dose change and bolus. MD states to not administer bolus or change dose at this time. MD reported that he plans to place orders for bid eliquis and once daily Cardizem. MD states once orders places and eliquis given, d/c heparin drip and potential discharge pt after lunch today

## 2020-11-23 NOTE — ED Notes (Signed)
MD notified pt troponin increased from 73 to 325

## 2020-11-23 NOTE — Consult Note (Signed)
[facility] Cardiology  CARDIOLOGY CONSULT NOTE  Patient ID: Philip Cook MRN: [id number] DOB/AGE: [birth year] 58 y.o.  Admit date: 11/23/2020 Referring Physician Damita Dunnings Primary Physician [facility] Primary Cardiologist Paraschos Reason for Consultation atrial fibrillation with rapid ventricular rate  HPI: 60 year old gentleman referred for evaluation of atrial fibrillation with rapid ventricular rate.  The patient was in his usual state of health until last evening, developed rotations and heart racing.  Patient was brought to [facility] ED via EMS.  Upon arrival, patient was tachycardic with a heart rate of 149.  ECG revealed atrial flutter at a rate of 149.  The patient was treated with diltiazem bolus x2, converted to atrial fibrillation at a rate of 101 bpm, and eventually converted to sinus rhythm.  Patient remains in sinus rhythm.  She was started on Cardizem 30 mg every 6, and currently is on heparin infusion.  Patient denies chest pain, shortness of breath or palpitations.  He reports recent episodes of exertional chest pain or shortness of breath at rest prior to onset of atrial fibrillation.  Admission labs notable for mildly elevated troponin of 73 and 325.  The patient has known coronary artery disease, status post drug-eluting stent mid LAD 06/30/2010.  Recent cardiac catheterization 04/07/2017 revealed patent LAD stent with 70% stenosis D1.  Review of systems complete and found to be negative unless listed above     Past Medical History:  Diagnosis Date  . CHF (congestive heart failure) ([place])   . Coronary artery disease   . Diabetes mellitus without complication ([place])   . History of kidney stones   . Hx MRSA infection   . Hyperlipidemia   . Hypertension   . Kidney stone   . Sleep apnea     Past Surgical History:  Procedure Laterality Date  . CARPAL TUNNEL RELEASE Bilateral   . COLONOSCOPY WITH PROPOFOL N/A 02/08/2020   Procedure: COLONOSCOPY WITH PROPOFOL;  Surgeon: Toledo, Benay Pike,  MD;  Location: [facility] ENDOSCOPY;  Service: Gastroenterology;  Laterality: N/A;  . CORONARY ANGIOPLASTY WITH STENT PLACEMENT    . CYSTOSCOPY W/ RETROGRADES  08/30/2020   Procedure: CYSTOSCOPY WITH RETROGRADE PYELOGRAM;  Surgeon: Billey Co, MD;  Location: [facility] ORS;  Service: Urology;;  . CYSTOSCOPY/URETEROSCOPY/HOLMIUM LASER/STENT PLACEMENT Right 08/30/2020   Procedure: CYSTOSCOPY/URETEROSCOPY/STENT PLACEMENT;  Surgeon: Billey Co, MD;  Location: [facility] ORS;  Service: Urology;  Laterality: Right;  . CYSTOSCOPY/URETEROSCOPY/HOLMIUM LASER/STENT PLACEMENT Right 09/09/2020   Procedure: CYSTOSCOPY/URETEROSCOPY/HOLMIUM LASER/STENT EXCHANGE;  Surgeon: Billey Co, MD;  Location: [facility] ORS;  Service: Urology;  Laterality: Right;  . LEFT HEART CATH AND CORONARY ANGIOGRAPHY N/A 04/07/2017   Procedure: Left Heart Cath and Coronary Angiography and possible PCI;  Surgeon: Yolonda Kida, MD;  Location: [place] CV LAB;  Service: Cardiovascular;  Laterality: N/A;  . ROTATOR CUFF REPAIR Left     (Not in a hospital admission)  Social History   Socioeconomic History  . Marital status: Married    Spouse name: Not on file  . Number of children: Not on file  . Years of education: Not on file  . Highest education level: Not on file  Occupational History  . Not on file  Tobacco Use  . Smoking status: Never Smoker  . Smokeless tobacco: Never Used  Vaping Use  . Vaping Use: Never used  Substance and Sexual Activity  . Alcohol use: No  . Drug use: No  . Sexual activity: Not on file  Other Topics Concern  . Not on file  Social History Narrative  . Not on file   Social Determinants of Health   Financial Resource Strain: Not on file  Food Insecurity: Not on file  Transportation Needs: Not on file  Physical Activity: Not on file  Stress: Not on file  Social Connections: Not on file  Intimate Partner Violence: Not on file    Family History  Problem Relation Age of Onset  . CAD  Father   . Heart failure Brother       Review of systems complete and found to be negative unless listed above      PHYSICAL EXAM  General: Well developed, well nourished, in no acute distress HEENT:  Normocephalic and atramatic Neck:  No JVD.  Lungs: Clear bilaterally to auscultation and percussion. Heart: HRRR . Normal S1 and S2 without gallops or murmurs.  Abdomen: Bowel sounds are positive, abdomen soft and non-tender  Msk:  Back normal, normal gait. Normal strength and tone for age. Extremities: No clubbing, cyanosis or edema.   Neuro: Alert and oriented X 3. Psych:  Good affect, responds appropriately  Labs:   Lab Results  Component Value Date   WBC 8.8 11/23/2020   HGB 15.4 11/23/2020   HCT 45.7 11/23/2020   MCV 93.5 11/23/2020   PLT 204 11/23/2020    Recent Labs  Lab 11/23/20 0038  NA 141  K 3.5  CL 107  CO2 24  BUN 16  CREATININE 0.91  CALCIUM 9.8  PROT 7.2  BILITOT 0.6  ALKPHOS 73  ALT 27  AST 30  GLUCOSE 212*   Lab Results  Component Value Date   TROPONINI <0.03 07/29/2017   No results found for: CHOL No results found for: HDL No results found for: LDLCALC No results found for: TRIG No results found for: CHOLHDL No results found for: LDLDIRECT    Radiology: DG Chest Portable 1 View  Result Date: 11/23/2020 CLINICAL DATA:  Chest pain EXAM: PORTABLE CHEST 1 VIEW COMPARISON:  09/17/2020 FINDINGS: Lungs are well expanded, symmetric, and clear. No pneumothorax or pleural effusion. Cardiac size within normal limits. Pulmonary vascularity is normal. Osseous structures are age-appropriate. Multiple healed right rib fractures are again noted. No acute bone abnormality. IMPRESSION: No active disease. Electronically Signed   By: Fidela Salisbury MD   On: 11/23/2020 01:33    EKG: Atrial fibrillation at 101 bpm  ASSESSMENT AND PLAN:   1.  New onset atrial flutter/atrial fibrillation with rapid ventricular rate, converted to sinus rhythm after diltiazem  IV bolus x2, currently in sinus rhythm, asymptomatic 2.  Borderline elevated high-sensitivity troponin, likely due to demand supply ischemia in the setting of atrial flutter/atrial flutter with rapid ventricular rate, 3.  Coronary artery disease, status post DES LAD 06/30/2010 without recent history of exertional chest pain  Recommendations  1.  Agree with overall current therapy 2.  Start Eliquis 5 mg twice daily 3.  DC heparin drip 4.  Transition diltiazem 30 mg every 6 to Cardizem CD 180 mg daily 5.  Ambulate, if patient does well consider discharge home today 6.  Follow-up with me as outpatient in 1 week  Signed: Isaias Cowman MD,PhD, [facility] 11/23/2020, 10:38 AM

## 2020-11-23 NOTE — Discharge Instructions (Signed)
Information on my medicine - ELIQUIS (apixaban) This medication education was reviewed with me or my healthcare representative as part of my discharge preparation. The pharmacist that spoke with me during my hospital stay was: ____________________________ (pharmacist name) WHY WAS [place]? Eliquis was prescribed for you to reduce the risk of a blood clot forming that can cause a stroke if you have a medical condition called atrial fibrillation (a type of irregular heartbeat). WHAT DO YOU NEED TO KNOW ABOUT ELIQUIS ? Take your Eliquis TWICE DAILY - one tablet in the morning and one tablet in the evening with or without food. If you have difficulty swallowing the tablet whole please discuss with your pharmacist how to take the medication safely. Take Eliquis exactly as prescribed by your doctor and DO NOT stop taking Eliquis without talking to the doctor who prescribed the medication. Stopping may increase your risk of developing a stroke. Refill your prescription before you run out. After discharge, you should have regular check-up appointments with your healthcare provider that is prescribing your Eliquis. In the future your dose may need to be changed if your kidney function or weight changes by a significant amount or as you get older. WHAT DO YOU DO IF YOU MISS A DOSE? If you miss a dose, take it as soon as you remember on the same day and resume taking twice daily. Do not take more than one dose of ELIQUIS at the same time to make up a missed dose. IMPORTANT SAFETY INFORMATION A possible side effect of Eliquis is bleeding. You should call your healthcare provider right away if you experience any of the following: ? Bleeding from an injury or your nose that does not stop. ? Unusual colored urine (red or dark brown) or unusual colored stools (red or black). ? Unusual bruising for unknown reasons. ? A serious fall or if you hit your head (even if there is no  bleeding). Some medicines may interact with Eliquis and might increase your risk of bleeding or clotting while on Eliquis. To help avoid this, consult your healthcare provider or pharmacist prior to using any new prescription or non-prescription medications, including herbals, vitamins, non-steroidal anti-inflammatory drugs (NSAIDs) and supplements. This website has more information on Eliquis (apixaban): [email or web address].no.

## 2020-11-23 NOTE — Progress Notes (Signed)
[place] for Heparin  Indication: chest pain/ACS, newonset Afib also  Allergies  Allergen Reactions  . Trulicity [Dulaglutide] Nausea And Vomiting  . Sulfa Antibiotics Rash    Patient Measurements: Height: 5\' 10"  (177.8 cm) Weight: 108.4 kg (239 lb) IBW/kg (Calculated) : 73 Heparin Dosing Weight: 96.4 kg   Vital Signs: Temp: 99.4 F (37.4 C) (03/05 0030) Temp Source: Oral (03/05 0030) BP: 151/75 (03/05 0830) Pulse Rate: 65 (03/05 0830)  Labs: Recent Labs    11/23/20 0038 11/23/20 0235 11/23/20 0750  HGB 15.4  --   --   HCT 45.7  --   --   PLT 204  --   --   APTT 29  --   --   LABPROT 12.6  --   --   INR 1.0  --   --   HEPARINUNFRC  --   --  0.10*  CREATININE 0.91  --   --   TROPONINIHS 73* 325*  --     Estimated Creatinine Clearance: 110.5 mL/min (by C-G formula based on SCr of 0.91 mg/dL).   Medical History: Past Medical History:  Diagnosis Date  . CHF (congestive heart failure) ([place])   . Coronary artery disease   . Diabetes mellitus without complication ([place])   . History of kidney stones   . Hx MRSA infection   . Hyperlipidemia   . Hypertension   . Kidney stone   . Sleep apnea     Medications:  (Not in a hospital admission)   Assessment: Pharmacy consulted to dose heparin in this 60 year old male admitted with ACS/NSTEMI.  CrCl = 110.5 ml/min. CADwith stent mid LAD 2011. Pt with Afib also CHA2DS2-VASc of 5 so will benefit from systemic anticoagulation for stroke prevention No prior anticoag noted.   Goal of Therapy:  Heparin level 0.3-0.7 units/ml Monitor platelets by anticoagulation protocol: Yes   Plan:  Give 4000 units bolus x 1 Start heparin infusion at 1300 units/hr Check anti-Xa level in 6 hours and daily while on heparin Continue to monitor H&H and platelets   3/5:  HL @ 0750= 0.10  Subtherapeutic.  Will order bolus of 2900 units x 1 and increase drip to 1600 units/hr. Check HL in 6 hrs and  f/u CBC in am   Iyona Pehrson A 11/23/2020,10:02 AM

## 2020-11-23 NOTE — Discharge Summary (Signed)
Philip Cook JOI:[id number] DOB: [birth year] DOA: 11/23/2020  PCP: Philip Crouch, MD  Admit date: 11/23/2020 Discharge date: 11/23/2020  Admitted From: Home Disposition: Home  Recommendations for Outpatient Follow-up:  1. Follow up with PCP in 1 week 2. Please obtain BMP/CBC in one week 3. Cardiology Dr. Saralyn Cook in 1 week      Discharge Condition:Stable CODE STATUS: Full Diet recommendation: Heart Healthy / Carb Modified  Brief/Interim Summary: Per OBS:Philip Cook is a 60 y.o. male with medical history significant for CAD with history of stent mid LAD 2947, diastolic CHF, diabetes, HTN, HLD and OSA, who presents to the emergency room with chest pain that started suddenly while at rest.  Pain was described as a tightness and of severe intensity, nonradiating and associated with palpitations.  He took an additional dose of his Imdur which did not help and decided to call EMS to come in.  He had no associated nausea, vomiting or diaphoresis.  Denied cough fever or chills. ED course: On arrival, BP 173/110, heart rate 149, temp 99.4, O2 sat 98% on room air.  Blood work with troponin 73>325.  Otherwise unremarkable EKG revealed A. fib with RVR at 149 with nonspecific ST-T wave changes.  Cardiology was consulted.  He was started on diltiazem and heparin drip.   Rapid atrial fibrillation, new onset (HCC)-likely causing his cp. -Patient presents with chest pain with EKG showing rapid A. fib rate 149 Given diltiazem IV bolus x2 Converted to sinus rhythm.  Currently asymptomatic ambulated without any symptoms. Cardiology saw patient and recommended to switch him to p.o. Cardizem CD 180 mg daily. He was transitioned from heparin to Eliquis as his chads vas score is 5 for primary prevention of stroke. Follow-up with cardiology as outpatient in 1 week Discussed with patient avoiding NSAIDs due to increased risk of GI bleed   Elevated Troponin-per cardiology likely due to demand supply  ischemia in the setting of A. fib with RVR. Hx/o CAD- with history of stent to mid LAD Follow-up cardiology in 1 week Continue outpatient medication. Per cardiology via chat patient can discontinue aspirin if he is taking Effient and continue Eliquis as above     Chronic diastolic heart failure (HCC) -Appears euvolemic without exacerbation Switch to Cardizem  Dc home nebivilol Decrease Lasix to 20 mg daily as unsure if blood pressure will tolerate his twice daily dosing.  He can follow-up with cardiology for any further dose adjustments as outpatient     Diabetes ([place]) Continue home meds    HTN (hypertension) Continue with Cardizem, Lasix    Obstructive sleep apnea -CPAP at home    Discharge Diagnoses:  Principal Problem:   Rapid atrial fibrillation ([place]) Active Problems:   Chronic diastolic heart failure (HCC)   Diabetes ([place])   HTN (hypertension)   Obstructive sleep apnea    Discharge Instructions  Discharge Instructions    Amb referral to AFIB Clinic   Complete by: As directed      Allergies as of 11/23/2020      Reactions   Trulicity [dulaglutide] Nausea And Vomiting   Sulfa Antibiotics Rash      Medication List    STOP taking these medications   acetaminophen 500 MG tablet Commonly known as: TYLENOL   aspirin 325 MG tablet   isosorbide mononitrate 30 MG 24 hr tablet Commonly known as: IMDUR   meloxicam 15 MG tablet Commonly known as: MOBIC   nebivolol 10 MG tablet Commonly known as: BYSTOLIC  TAKE these medications   apixaban 5 MG Tabs tablet Commonly known as: ELIQUIS Take 1 tablet (5 mg total) by mouth 2 (two) times daily.   diltiazem 180 MG 24 hr capsule Commonly known as: CARDIZEM CD Take 1 capsule (180 mg total) by mouth daily.   furosemide 20 MG tablet Commonly known as: Lasix Take 1 tablet (20 mg total) by mouth daily. What changed: when to take this   HUMULIN R 500 UNIT/ML injection Generic drug: insulin regular human  CONCENTRATED Inject into the skin. Use in insulin pump   insulin pump Soln Inject into the skin.   multivitamin with minerals Tabs tablet Take 1 tablet by mouth daily.   oxybutynin 10 MG 24 hr tablet Commonly known as: Ditropan XL Take 1 tablet (10 mg total) by mouth daily as needed (bladder spasm/stent pain).   prasugrel 10 MG Tabs tablet Commonly known as: EFFIENT Take 10 mg by mouth daily.   rosuvastatin 10 MG tablet Commonly known as: CRESTOR Take 1 tablet (10 mg total) by mouth daily.   tamsulosin 0.4 MG Caps capsule Commonly known as: FLOMAX Take 1 capsule (0.4 mg total) by mouth daily after supper.       Follow-up Information    Philip Crouch, MD Follow up in 1 week(s).   Specialty: Internal Medicine Contact information: [place] [address] [phone]        Philip Cowman, MD Follow up in 1 week(s).   Specialty: Cardiology Contact information: [facility] [place] [address] [phone]              Allergies  Allergen Reactions  . Trulicity [Dulaglutide] Nausea And Vomiting  . Sulfa Antibiotics Rash    Consultations:  Cardiology   Procedures/Studies: DG Chest Portable 1 View  Result Date: 11/23/2020 CLINICAL DATA:  Chest pain EXAM: PORTABLE CHEST 1 VIEW COMPARISON:  09/17/2020 FINDINGS: Lungs are well expanded, symmetric, and clear. No pneumothorax or pleural effusion. Cardiac size within normal limits. Pulmonary vascularity is normal. Osseous structures are age-appropriate. Multiple healed right rib fractures are again noted. No acute bone abnormality. IMPRESSION: No active disease. Electronically Signed   By: Fidela Salisbury MD   On: 11/23/2020 01:33       Subjective: Feels well.  In sinus rhythm on telemetry.  Denies chest pain or shortness of breath.  Ambulated without any issues  Discharge Exam: Vitals:   11/23/20 1100 11/23/20 1130   BP: (!) 155/64 (!) 159/80  Pulse: 69 63  Resp:  18  Temp:    SpO2: 95% 93%   Vitals:   11/23/20 0800 11/23/20 0830 11/23/20 1100 11/23/20 1130  BP: (!) 146/74 (!) 151/75 (!) 155/64 (!) 159/80  Pulse: 64 65 69 63  Resp:  14  18  Temp:      TempSrc:      SpO2: 95% 93% 95% 93%  Weight:      Height:        General: Pt is alert, awake, not in acute distress Cardiovascular: RRR, S1/S2 +, no rubs, no gallops Respiratory: CTA bilaterally, no wheezing, no rhonchi Abdominal: Soft, NT, ND, bowel sounds + Extremities:mild edema b/l with chronic skin changes    The results of significant diagnostics from this hospitalization (including imaging, microbiology, ancillary and laboratory) are listed below for reference.     Microbiology: Recent Results (from the past 240 hour(s))  Resp Panel by RT-PCR (Flu A&B, Covid) Nasopharyngeal Swab  Status: None   Collection Time: 11/23/20  2:35 AM   Specimen: Nasopharyngeal Swab; Nasopharyngeal(NP) swabs in vial transport medium  Result Value Ref Range Status   SARS Coronavirus 2 by RT PCR NEGATIVE NEGATIVE Final    Comment: (NOTE) SARS-CoV-2 target nucleic acids are NOT DETECTED.  The SARS-CoV-2 RNA is generally detectable in upper respiratory specimens during the acute phase of infection. The lowest concentration of SARS-CoV-2 viral copies this assay can detect is 138 copies/mL. A negative result does not preclude SARS-Cov-2 infection and should not be used as the sole basis for treatment or other patient management decisions. A negative result may occur with  improper specimen collection/handling, submission of specimen other than nasopharyngeal swab, presence of viral mutation(s) within the areas targeted by this assay, and inadequate number of viral copies(<138 copies/mL). A negative result must be combined with clinical observations, patient history, and epidemiological information. The expected result is Negative.  Fact Sheet for  Patients:  EntrepreneurPulse.com.au  Fact Sheet for Healthcare Providers:  IncredibleEmployment.be  This test is no t yet approved or cleared by the [place] FDA and  has been authorized for detection and/or diagnosis of SARS-CoV-2 by FDA under an Emergency Use Authorization (EUA). This EUA will remain  in effect (meaning this test can be used) for the duration of the COVID-19 declaration under Section 564(b)(1) of the Act, 21 U.S.C.section 360bbb-3(b)(1), unless the authorization is terminated  or revoked sooner.       Influenza A by PCR NEGATIVE NEGATIVE Final   Influenza B by PCR NEGATIVE NEGATIVE Final    Comment: (NOTE) The Xpert Xpress SARS-CoV-2/FLU/RSV plus assay is intended as an aid in the diagnosis of influenza from Nasopharyngeal swab specimens and should not be used as a sole basis for treatment. Nasal washings and aspirates are unacceptable for Xpert Xpress SARS-CoV-2/FLU/RSV testing.  Fact Sheet for Patients: EntrepreneurPulse.com.au  Fact Sheet for Healthcare Providers: IncredibleEmployment.be  This test is not yet approved or cleared by the [place] FDA and has been authorized for detection and/or diagnosis of SARS-CoV-2 by FDA under an Emergency Use Authorization (EUA). This EUA will remain in effect (meaning this test can be used) for the duration of the COVID-19 declaration under Section 564(b)(1) of the Act, 21 U.S.C. section 360bbb-3(b)(1), unless the authorization is terminated or revoked.  Performed at [facility], [address] [place], [place] [address]      Labs: BNP (last 3 results) Recent Labs    09/17/20 0848 11/23/20 0038  BNP 57.2 25.3   Basic Metabolic Panel: Recent Labs  Lab 11/23/20 0038  NA 141  K 3.5  CL 107  CO2 24  GLUCOSE 212*  BUN 16  CREATININE 0.91  CALCIUM 9.8  MG 2.1   Liver Function Tests: Recent Labs  Lab  11/23/20 0038  AST 30  ALT 27  ALKPHOS 73  BILITOT 0.6  PROT 7.2  ALBUMIN 3.8   Recent Labs  Lab 11/23/20 0038  LIPASE 33   No results for input(s): AMMONIA in the last 168 hours. CBC: Recent Labs  Lab 11/23/20 0038  WBC 8.8  HGB 15.4  HCT 45.7  MCV 93.5  PLT 204   Cardiac Enzymes: No results for input(s): CKTOTAL, CKMB, CKMBINDEX, TROPONINI in the last 168 hours. BNP: Invalid input(s): POCBNP CBG: Recent Labs  Lab 11/23/20 0731  GLUCAP 169*   D-Dimer No results for input(s): DDIMER in the last 72 hours. Hgb A1c Recent Labs    11/23/20 0348  HGBA1C 8.2*   Lipid Profile No results for input(s): CHOL, HDL, LDLCALC, TRIG, CHOLHDL, LDLDIRECT in the last 72 hours. Thyroid function studies No results for input(s): TSH, T4TOTAL, T3FREE, THYROIDAB in the last 72 hours.  Invalid input(s): FREET3 Anemia work up No results for input(s): VITAMINB12, FOLATE, FERRITIN, TIBC, IRON, RETICCTPCT in the last 72 hours. Urinalysis    Component Value Date/Time   COLORURINE AMBER (A) 09/17/2020 0848   APPEARANCEUR Cloudy (A) 09/25/2020 1336   LABSPEC 1.009 09/17/2020 0848   PHURINE 6.0 09/17/2020 0848   GLUCOSEU Negative 09/25/2020 1336   HGBUR LARGE (A) 09/17/2020 0848   BILIRUBINUR Negative 09/25/2020 1336   KETONESUR NEGATIVE 09/17/2020 0848   PROTEINUR 2+ (A) 09/25/2020 1336   PROTEINUR 100 (A) 09/17/2020 0848   NITRITE Negative 09/25/2020 1336   NITRITE NEGATIVE 09/17/2020 0848   LEUKOCYTESUR 1+ (A) 09/25/2020 1336   LEUKOCYTESUR MODERATE (A) 09/17/2020 0848   Sepsis Labs Invalid input(s): PROCALCITONIN,  WBC,  LACTICIDVEN Microbiology Recent Results (from the past 240 hour(s))  Resp Panel by RT-PCR (Flu A&B, Covid) Nasopharyngeal Swab     Status: None   Collection Time: 11/23/20  2:35 AM   Specimen: Nasopharyngeal Swab; Nasopharyngeal(NP) swabs in vial transport medium  Result Value Ref Range Status   SARS Coronavirus 2 by RT PCR NEGATIVE NEGATIVE Final     Comment: (NOTE) SARS-CoV-2 target nucleic acids are NOT DETECTED.  The SARS-CoV-2 RNA is generally detectable in upper respiratory specimens during the acute phase of infection. The lowest concentration of SARS-CoV-2 viral copies this assay can detect is 138 copies/mL. A negative result does not preclude SARS-Cov-2 infection and should not be used as the sole basis for treatment or other patient management decisions. A negative result may occur with  improper specimen collection/handling, submission of specimen other than nasopharyngeal swab, presence of viral mutation(s) within the areas targeted by this assay, and inadequate number of viral copies(<138 copies/mL). A negative result must be combined with clinical observations, patient history, and epidemiological information. The expected result is Negative.  Fact Sheet for Patients:  EntrepreneurPulse.com.au  Fact Sheet for Healthcare Providers:  IncredibleEmployment.be  This test is no t yet approved or cleared by the [place] FDA and  has been authorized for detection and/or diagnosis of SARS-CoV-2 by FDA under an Emergency Use Authorization (EUA). This EUA will remain  in effect (meaning this test can be used) for the duration of the COVID-19 declaration under Section 564(b)(1) of the Act, 21 U.S.C.section 360bbb-3(b)(1), unless the authorization is terminated  or revoked sooner.       Influenza A by PCR NEGATIVE NEGATIVE Final   Influenza B by PCR NEGATIVE NEGATIVE Final    Comment: (NOTE) The Xpert Xpress SARS-CoV-2/FLU/RSV plus assay is intended as an aid in the diagnosis of influenza from Nasopharyngeal swab specimens and should not be used as a sole basis for treatment. Nasal washings and aspirates are unacceptable for Xpert Xpress SARS-CoV-2/FLU/RSV testing.  Fact Sheet for Patients: EntrepreneurPulse.com.au  Fact Sheet for Healthcare  Providers: IncredibleEmployment.be  This test is not yet approved or cleared by the [place] FDA and has been authorized for detection and/or diagnosis of SARS-CoV-2 by FDA under an Emergency Use Authorization (EUA). This EUA will remain in effect (meaning this test can be used) for the duration of the COVID-19 declaration under Section 564(b)(1) of the Act, 21 U.S.C. section 360bbb-3(b)(1), unless the authorization is terminated or revoked.  Performed at [facility], [place], [place]  [address]      Time coordinating discharge: Over 30 minutes  SIGNED:   Nolberto Hanlon, MD  [facility] 11/23/2020, 11:48 AM Pager   If 7PM-7AM, please contact night-coverage [email or web address] Password [email or web address]

## 2020-11-23 NOTE — ED Notes (Signed)
Assumed care of patient, resting comfortably. Respirations even and unlabored. NAD noted, no complaints

## 2020-11-23 NOTE — H&P (Signed)
History and Physical    Philip Cook YNW:[id number] DOB: 1961-03-15 DOA: 11/23/2020  PCP: [name], MD   Patient coming from: Home  I have personally briefly reviewed patient's old medical records in [place]  Chief Complaint: Chest pain  HPI: Philip Cook is a 60 y.o. male with medical history significant for CAD with history of stent mid LAD 6578, diastolic CHF, diabetes, HTN, HLD and OSA, who presents to the emergency room with chest pain that started suddenly while at rest.  Pain was described as a tightness and of severe intensity, nonradiating and associated with palpitations.  He took an additional dose of his Imdur which did not help and decided to call EMS to come in.  He had no associated nausea, vomiting or diaphoresis.  Denied cough fever or chills. ED course: On arrival, BP 173/110, heart rate 149, temp 99.4, O2 sat 98% on room air.  Blood work with troponin 73>325.  Otherwise unremarkable EKG as interpreted by me: A. fib with RVR at 149 with nonspecific ST-T wave changes Imaging: Chest x-ray: No active disease  Patient received diltiazem IV bolus assist x2 with improvement in rate to the high 90s low 100s and was subsequently given an oral dose of diltiazem with improvement in chest pain.  He was started on heparin infusion.  Hospitalist consulted for admission.    Review of Systems: As per HPI otherwise all other systems on review of systems negative.    Past Medical History:  Diagnosis Date  . CHF (congestive heart failure) ([place])   . Coronary artery disease   . Diabetes mellitus without complication ([place])   . History of kidney stones   . Hx MRSA infection   . Hyperlipidemia   . Hypertension   . Kidney stone   . Sleep apnea     Past Surgical History:  Procedure Laterality Date  . CARPAL TUNNEL RELEASE Bilateral   . COLONOSCOPY WITH PROPOFOL N/A 02/08/2020   Procedure: COLONOSCOPY WITH PROPOFOL;  Surgeon: Toledo, Benay Pike, MD;  Location:  [facility];  Service: Gastroenterology;  Laterality: N/A;  . CORONARY ANGIOPLASTY WITH STENT PLACEMENT    . CYSTOSCOPY W/ RETROGRADES  08/30/2020   Procedure: CYSTOSCOPY WITH RETROGRADE PYELOGRAM;  Surgeon: Billey Co, MD;  Location: [facility] ORS;  Service: Urology;;  . CYSTOSCOPY/URETEROSCOPY/HOLMIUM LASER/STENT PLACEMENT Right 08/30/2020   Procedure: CYSTOSCOPY/URETEROSCOPY/STENT PLACEMENT;  Surgeon: Billey Co, MD;  Location: [facility] ORS;  Service: Urology;  Laterality: Right;  . CYSTOSCOPY/URETEROSCOPY/HOLMIUM LASER/STENT PLACEMENT Right 09/09/2020   Procedure: CYSTOSCOPY/URETEROSCOPY/HOLMIUM LASER/STENT EXCHANGE;  Surgeon: Billey Co, MD;  Location: [facility] ORS;  Service: Urology;  Laterality: Right;  . LEFT HEART CATH AND CORONARY ANGIOGRAPHY N/A 04/07/2017   Procedure: Left Heart Cath and Coronary Angiography and possible PCI;  Surgeon: Yolonda Kida, MD;  Location: [facility];  Service: Cardiovascular;  Laterality: N/A;  . ROTATOR CUFF REPAIR Left      reports that he has never smoked. He has never used smokeless tobacco. He reports that he does not drink alcohol and does not use drugs.  Allergies  Allergen Reactions  . Trulicity [Dulaglutide] Nausea And Vomiting  . Sulfa Antibiotics Rash    Family History  Problem Relation Age of Onset  . CAD Father   . Heart failure Brother       Prior to Admission medications   Medication Sig Start Date End Date Taking? Authorizing Provider  acetaminophen (TYLENOL) 500 MG tablet Take 1,000 mg by  mouth every 6 (six) hours as needed for moderate pain.    [provider]  aspirin 325 MG tablet Discuss with your outpatient provider about reducing or stopping this, since you are already on Effient which is a similar blood thinner. Patient not taking: Reported on 10/17/2020 09/20/20   Enzo Bi, MD  aspirin EC 81 MG tablet Take 81 mg by mouth daily. Swallow whole.    [provider]  furosemide (LASIX)  20 MG tablet Take 1 tablet (20 mg total) by mouth 2 (two) times daily. Patient taking differently: Take 20 mg by mouth 2 (two) times daily. 04/07/17 02/08/20  Dustin Flock, MD  HUMULIN R 500 UNIT/ML injection Inject into the skin. Use in insulin pump 08/26/20   [provider]  Insulin Human (INSULIN PUMP) SOLN Inject into the skin.     [provider]  isosorbide mononitrate (IMDUR) 30 MG 24 hr tablet Take 2 tablets (60 mg total) daily by mouth. 07/29/17   Fritzi Mandes, MD  meloxicam (MOBIC) 15 MG tablet Take 1 tablet (15 mg total) by mouth at bedtime as needed for pain. 09/20/20   Enzo Bi, MD  Multiple Vitamin (MULTIVITAMIN WITH MINERALS) TABS tablet Take 1 tablet by mouth daily.    [provider]  nebivolol (BYSTOLIC) 10 MG tablet Take 10 mg by mouth daily.    [provider]  oxybutynin (DITROPAN XL) 10 MG 24 hr tablet Take 1 tablet (10 mg total) by mouth daily as needed (bladder spasm/stent pain). Patient not taking: Reported on 10/17/2020 08/30/20 08/30/21  Billey Co, MD  prasugrel (EFFIENT) 10 MG TABS tablet Take 10 mg by mouth daily. 01/04/17   [provider]  rosuvastatin (CRESTOR) 10 MG tablet Take 1 tablet (10 mg total) by mouth daily. 04/08/17   Dustin Flock, MD  tamsulosin (FLOMAX) 0.4 MG CAPS capsule Take 1 capsule (0.4 mg total) by mouth daily after supper. Patient not taking: Reported on 10/17/2020 09/11/20   Billey Co, MD    Physical Exam: Vitals:   11/23/20 0030 11/23/20 0053 11/23/20 0209 11/23/20 0245  BP: (!) 173/110 (!) 153/91 117/75 127/68  Pulse: (!) 149   70  Resp: 12     Temp: 99.4 F (37.4 C)     TempSrc: Oral     SpO2: 98%   93%  Weight:      Height:         Vitals:   11/23/20 0030 11/23/20 0053 11/23/20 0209 11/23/20 0245  BP: (!) 173/110 (!) 153/91 117/75 127/68  Pulse: (!) 149   70  Resp: 12     Temp: 99.4 F (37.4 C)     TempSrc: Oral     SpO2: 98%   93%  Weight:      Height:           Constitutional: Alert and oriented x 3 . Not in any apparent distress HEENT:      Head: Normocephalic and atraumatic.         Eyes: PERLA, EOMI, Conjunctivae are normal. Sclera is non-icteric.       Mouth/Throat: Mucous membranes are moist.       Neck: Supple with no signs of meningismus. Cardiovascular:  Irregularly irregular. No murmurs, gallops, or rubs. 2+ symmetrical distal pulses are present . No JVD. No 2+ LE edema Respiratory: Respiratory effort normal .Lungs sounds clear bilaterally. No wheezes, crackles, or rhonchi.  Gastrointestinal: Soft, non tender, and non distended with positive bowel sounds.  Genitourinary: No CVA tenderness. Musculoskeletal: Nontender with normal range of motion in all extremities. No cyanosis, or erythema of extremities. Neurologic:  Face is symmetric. Moving all extremities. No gross focal neurologic deficits . Skin: Skin is warm, dry.  No rash or ulcers Psychiatric: Mood and affect are normal    Labs on Admission: I have personally reviewed following labs and imaging studies  CBC: Recent Labs  Lab 11/23/20 0038  WBC 8.8  HGB 15.4  HCT 45.7  MCV 93.5  PLT 829   Basic Metabolic Panel: Recent Labs  Lab 11/23/20 0038  NA 141  K 3.5  CL 107  CO2 24  GLUCOSE 212*  BUN 16  CREATININE 0.91  CALCIUM 9.8  MG 2.1   GFR: Estimated Creatinine Clearance: 110.5 mL/min (by C-G formula based on SCr of 0.91 mg/dL). Liver Function Tests: Recent Labs  Lab 11/23/20 0038  AST 30  ALT 27  ALKPHOS 73  BILITOT 0.6  PROT 7.2  ALBUMIN 3.8   Recent Labs  Lab 11/23/20 0038  LIPASE 33   No results for input(s): AMMONIA in the last 168 hours. Coagulation Profile: Recent Labs  Lab 11/23/20 0038  INR 1.0   Cardiac Enzymes: No results for input(s): CKTOTAL, CKMB, CKMBINDEX, TROPONINI in the last 168 hours. BNP (last 3 results) No results for input(s): PROBNP in the last 8760 hours. HbA1C: No results for input(s): HGBA1C in the last  72 hours. CBG: No results for input(s): GLUCAP in the last 168 hours. Lipid Profile: No results for input(s): CHOL, HDL, LDLCALC, TRIG, CHOLHDL, LDLDIRECT in the last 72 hours. Thyroid Function Tests: No results for input(s): TSH, T4TOTAL, FREET4, T3FREE, THYROIDAB in the last 72 hours. Anemia Panel: No results for input(s): VITAMINB12, FOLATE, FERRITIN, TIBC, IRON, RETICCTPCT in the last 72 hours. Urine analysis:    Component Value Date/Time   COLORURINE AMBER (A) 09/17/2020 0848   APPEARANCEUR Cloudy (A) 09/25/2020 1336   LABSPEC 1.009 09/17/2020 0848   PHURINE 6.0 09/17/2020 0848   GLUCOSEU Negative 09/25/2020 1336   HGBUR LARGE (A) 09/17/2020 0848   BILIRUBINUR Negative 09/25/2020 1336   KETONESUR NEGATIVE 09/17/2020 0848   PROTEINUR 2+ (A) 09/25/2020 1336   PROTEINUR 100 (A) 09/17/2020 0848   NITRITE Negative 09/25/2020 1336   NITRITE NEGATIVE 09/17/2020 0848   LEUKOCYTESUR 1+ (A) 09/25/2020 1336   LEUKOCYTESUR MODERATE (A) 09/17/2020 0848    Radiological Exams on Admission: DG Chest Portable 1 View  Result Date: 11/23/2020 CLINICAL DATA:  Chest pain EXAM: PORTABLE CHEST 1 VIEW COMPARISON:  09/17/2020 FINDINGS: Lungs are well expanded, symmetric, and clear. No pneumothorax or pleural effusion. Cardiac size within normal limits. Pulmonary vascularity is normal. Osseous structures are age-appropriate. Multiple healed right rib fractures are again noted. No acute bone abnormality. IMPRESSION: No active disease. Electronically Signed   By: Fidela Salisbury MD   On: 11/23/2020 01:33     Assessment/Plan 60 year old male with history of CADwith stent mid LAD 5621, diastolic CHF, diabetes, HTN, HLD and OSA, who presents to the emergency room with chest pain that started suddenly while at rest.     Rapid atrial fibrillation, new onset ([place]) -Patient presents with chest pain with EKG showing rapid A. fib rate 149, improving after diltiazem IV bolus x2 -CHA2DS2-VASc of 5 so will  benefit from systemic anticoagulation for stroke prevention -Continue oral diltiazem for rate control -Continue heparin infusion pending discussion on choice of DOAC -Echocardiogram in the a.m. -Cardiology consult  Chest pain, possible NSTEMI versus demand  ischemia CAD with history of stent to mid LAD -Patient with chest pain, uptrending troponin 73>325, which could be demand ischemia from rapid A. fib.  EKG with nonspecific ST-T wave changes -Continue heparin infusion -Continue aspirin, statin, beta-blocker, Imdur pending med rec.  Hold Effient -Patient was pain relieved following diltiazem bolus -Nitroglycerin sublingual as needed chest pain with morphine for breakthrough -Cardiology consult    Chronic diastolic heart failure ([facility]) -Appears euvolemic -Continue nebivolol, furosemide.  Not currently on ACE/ARB    Diabetes ([place]) -Sliding scale insulin coverage    HTN (hypertension) -Continue home meds pending med rec    Obstructive sleep apnea -CPAP if desired    DVT prophylaxis: Full dose heparin Code Status: full code  Family Communication:  none  Disposition Plan: Back to previous home environment Consults called: Cardiology Status:At the time of admission, it appears that the appropriate admission status for this patient is INPATIENT. This is judged to be reasonable and necessary in order to provide the required intensity of service to ensure the patient's safety given the presenting symptoms, physical exam findings, and initial radiographic and laboratory data in the context of their  Comorbid conditions.   Patient requires inpatient status due to high intensity of service, high risk for further deterioration and high frequency of surveillance required.   I certify that at the point of admission it is my clinical judgment that the patient will require inpatient hospital care spanning beyond [place] MD [facility]     11/23/2020, 3:18 AM

## 2020-11-23 NOTE — ED Provider Notes (Signed)
[facility] Emergency Department Provider Note  ____________________________________________   Event Date/Time   First MD Initiated Contact with Patient 11/23/20 0041     (approximate)  I have reviewed the triage vital signs and the nursing notes.   HISTORY  Chief Complaint Chest Pain    HPI Philip Cook is a 60 y.o. male with medical history as listed below which notably includes CHF, sleep apnea, diabetes, hypertension, and coronary artery disease status post stent.  His cardiologist is Dr. Saralyn Pilar.  He presents tonight by EMS for acute onset and severe crushing chest pain/heaviness associated with some shortness of breath and sensation of rapid heartbeat and palpitations.  He said he was sitting not doing anything in particular when this happened.  He has never felt this kind of sensation previously.  He tried taking an additional Imdur and it did not seem to help.  His blood pressure and heart rate were elevated at that time and he said his heart rate was in the 470J, systolic blood pressure around 200.  After short period of time of not feeling better he called EMS and was told to chew a full dose 325 mg aspirin which he did.   He received 1 nitro by EMS prior to arrival and said it helped a little bit.  He has no history of atrial fibrillation or other cardiac arrhythmia.  He takes prasugrel for his history of stent.  He has been compliant with his frusemide and said he has not noticed a significant weight gain recently.  He denies fever/chills, sore throat, nausea, vomiting, abdominal pain, and dysuria.  The onset of the symptoms was acute and they were severe.  They are currently mild to moderate.  The Imdur and the nitroglycerin helped a little bit, exertion makes his symptoms worse.        Past Medical History:  Diagnosis Date  . CHF (congestive heart failure) ([place])   . Coronary artery disease   . Diabetes mellitus without complication ([place])    . History of kidney stones   . Hx MRSA infection   . Hyperlipidemia   . Hypertension   . Kidney stone   . Sleep apnea     Patient Active Problem List   Diagnosis Date Noted  . Rapid atrial fibrillation ([place]) 11/23/2020  . Acute on chronic diastolic CHF (congestive heart failure) ([place]) 09/17/2020  . Leukocytosis 09/17/2020  . Acute on chronic diastolic (congestive) heart failure ([place]) 09/17/2020  . CAD (coronary artery disease) 07/28/2017  . Chronic diastolic heart failure ([place]) 04/19/2017  . Diabetes ([place]) 04/19/2017  . HTN (hypertension) 04/19/2017  . Obstructive sleep apnea 04/19/2017  . S/P cardiac catheterization 04/19/2017  . Chest pain 04/05/2017    Past Surgical History:  Procedure Laterality Date  . CARPAL TUNNEL RELEASE Bilateral   . COLONOSCOPY WITH PROPOFOL N/A 02/08/2020   Procedure: COLONOSCOPY WITH PROPOFOL;  Surgeon: Toledo, Benay Pike, MD;  Location: [facility] ENDOSCOPY;  Service: Gastroenterology;  Laterality: N/A;  . CORONARY ANGIOPLASTY WITH STENT PLACEMENT    . CYSTOSCOPY W/ RETROGRADES  08/30/2020   Procedure: CYSTOSCOPY WITH RETROGRADE PYELOGRAM;  Surgeon: Billey Co, MD;  Location: [facility] ORS;  Service: Urology;;  . CYSTOSCOPY/URETEROSCOPY/HOLMIUM LASER/STENT PLACEMENT Right 08/30/2020   Procedure: CYSTOSCOPY/URETEROSCOPY/STENT PLACEMENT;  Surgeon: Billey Co, MD;  Location: [facility] ORS;  Service: Urology;  Laterality: Right;  . CYSTOSCOPY/URETEROSCOPY/HOLMIUM LASER/STENT PLACEMENT Right 09/09/2020   Procedure: CYSTOSCOPY/URETEROSCOPY/HOLMIUM LASER/STENT EXCHANGE;  Surgeon: Billey Co, MD;  Location: [facility] ORS;  Service: Urology;  Laterality: Right;  . LEFT HEART CATH AND CORONARY ANGIOGRAPHY N/A 04/07/2017   Procedure: Left Heart Cath and Coronary Angiography and possible PCI;  Surgeon: Yolonda Kida, MD;  Location: [facility];  Service: Cardiovascular;  Laterality: N/A;  . ROTATOR CUFF REPAIR Left     Prior to Admission medications    Medication Sig Start Date End Date Taking? Authorizing Provider  acetaminophen (TYLENOL) 500 MG tablet Take 1,000 mg by mouth every 6 (six) hours as needed for moderate pain.    [provider]  aspirin 325 MG tablet Discuss with your outpatient provider about reducing or stopping this, since you are already on Effient which is a similar blood thinner. Patient not taking: Reported on 10/17/2020 09/20/20   Enzo Bi, MD  aspirin EC 81 MG tablet Take 81 mg by mouth daily. Swallow whole.    [provider]  furosemide (LASIX) 20 MG tablet Take 1 tablet (20 mg total) by mouth 2 (two) times daily. Patient taking differently: Take 20 mg by mouth 2 (two) times daily. 04/07/17 02/08/20  Dustin Flock, MD  HUMULIN R 500 UNIT/ML injection Inject into the skin. Use in insulin pump 08/26/20   [provider]  Insulin Human (INSULIN PUMP) SOLN Inject into the skin.     [provider]  isosorbide mononitrate (IMDUR) 30 MG 24 hr tablet Take 2 tablets (60 mg total) daily by mouth. 07/29/17   Fritzi Mandes, MD  meloxicam (MOBIC) 15 MG tablet Take 1 tablet (15 mg total) by mouth at bedtime as needed for pain. 09/20/20   Enzo Bi, MD  Multiple Vitamin (MULTIVITAMIN WITH MINERALS) TABS tablet Take 1 tablet by mouth daily.    [provider]  nebivolol (BYSTOLIC) 10 MG tablet Take 10 mg by mouth daily.    [provider]  oxybutynin (DITROPAN XL) 10 MG 24 hr tablet Take 1 tablet (10 mg total) by mouth daily as needed (bladder spasm/stent pain). Patient not taking: Reported on 10/17/2020 08/30/20 08/30/21  Billey Co, MD  prasugrel (EFFIENT) 10 MG TABS tablet Take 10 mg by mouth daily. 01/04/17   [provider]  rosuvastatin (CRESTOR) 10 MG tablet Take 1 tablet (10 mg total) by mouth daily. 04/08/17   Dustin Flock, MD  tamsulosin (FLOMAX) 0.4 MG CAPS capsule Take 1 capsule (0.4 mg total) by mouth daily after supper. Patient not taking: Reported on  10/17/2020 09/11/20   Billey Co, MD    Allergies Trulicity [dulaglutide] and Sulfa antibiotics  Family History  Problem Relation Age of Onset  . CAD Father   . Heart failure Brother     Social History Social History   Tobacco Use  . Smoking status: Never Smoker  . Smokeless tobacco: Never Used  Vaping Use  . Vaping Use: Never used  Substance Use Topics  . Alcohol use: No  . Drug use: No    Review of Systems Constitutional: No fever/chills Eyes: No visual changes. ENT: No sore throat. Cardiovascular: Positive for chest pain and palpitations/rapid heart rate. Respiratory: Mild shortness of breath listed with chest pain and heart rate. Gastrointestinal: No abdominal pain.  No nausea, no vomiting.  No diarrhea.  No constipation. Genitourinary: Negative for dysuria. Musculoskeletal: Negative for neck pain.  Negative for back pain. Integumentary: Negative for rash. Neurological: Negative for headaches, focal weakness or numbness.   ____________________________________________   PHYSICAL EXAM:  VITAL SIGNS: ED Triage Vitals  Enc Vitals Group  BP 11/23/20 0030 (!) 173/110     Pulse Rate 11/23/20 0030 (!) 149     Resp 11/23/20 0030 12     Temp 11/23/20 0030 99.4 F (37.4 C)     Temp Source 11/23/20 0030 Oral     SpO2 11/23/20 0030 98 %     Weight 11/23/20 0029 108.4 kg (239 lb)     Height 11/23/20 0029 1.778 m (5\' 10" )     Head Circumference --      Peak Flow --      Pain Score 11/23/20 0029 2     Pain Loc --      Pain Edu? --      Excl. in [place]? --     Constitutional: Alert and oriented.  Eyes: Conjunctivae are normal.  Head: Atraumatic. Nose: No congestion/rhinnorhea. Mouth/Throat: Patient is wearing a mask. Neck: No stridor.  No meningeal signs.   Cardiovascular: Tachycardia, regular rhythm. Good peripheral circulation. Respiratory: Normal respiratory effort.  No retractions. Gastrointestinal: Obese.  Soft and nontender. No distention.   Musculoskeletal: Chronic 1+ pitting edema in bilateral lower extremities which the patient says is his baseline. No gross deformities of extremities. Neurologic:  Normal speech and language. No gross focal neurologic deficits are appreciated.  Skin:  Skin is warm, dry and intact. Psychiatric: Mood and affect are normal. Speech and behavior are normal.  ____________________________________________    LABS (all labs ordered are listed, but only abnormal results are displayed)  Labs Reviewed  BASIC METABOLIC PANEL - Abnormal; Notable for the following components:      Result Value   Glucose, Bld 212 (*)    All other components within normal limits  TROPONIN I (HIGH SENSITIVITY) - Abnormal; Notable for the following components:   Troponin I (High Sensitivity) 73 (*)    All other components within normal limits  RESP PANEL BY RT-PCR (FLU A&B, COVID) ARPGX2  CBC  HEPATIC FUNCTION PANEL  LIPASE, BLOOD  MAGNESIUM  BRAIN NATRIURETIC PEPTIDE  PROTIME-INR  APTT  TROPONIN I (HIGH SENSITIVITY)   ____________________________________________  EKG  ED ECG REPORT #1 I, Hinda Kehr, the attending physician, personally viewed and interpreted this ECG.  Date: 11/23/2020 EKG Time: 00: 33 Rate: 149 Rhythm: Atrial flutter with probable 2-1 conduction. QRS Axis: normal Intervals: Abnormal due to arrhythmia and QTC of 515 ms ST/T Wave abnormalities: Non-specific ST segment / T-wave changes, probable rate related ischemia Narrative Interpretation: probable rate related ischemia   ED ECG REPORT #2 I, Hinda Kehr, the attending physician, personally viewed and interpreted this ECG.  Date: 11/23/2020 EKG Time: 00:54 Rate: 148 Rhythm: atrial flutter with probable 2:1 conduction QRS Axis: normal Intervals: prolonged QTc at 580 ms ST/T Wave abnormalities: Non-specific ST segment / T-wave changes, probable rate related ischemia Narrative Interpretation: probable rate related  ischemia   ED ECG REPORT #3 I, Hinda Kehr, the attending physician, personally viewed and interpreted this ECG.  Date: 11/23/2020 EKG Time: 1:46 Rate: 101 Rhythm: atrial fibrillation QRS Axis: normal Intervals: normal ST/T Wave abnormalities: Non-specific ST segment / T-wave changes, but no clear evidence of acute ischemia. Narrative Interpretation: no definitive evidence of acute ischemia; does not meet STEMI criteria.     ____________________________________________  RADIOLOGY I, Hinda Kehr, personally viewed and evaluated these images (plain radiographs) as part of my medical decision making, as well as reviewing the written report by the radiologist.  ED MD interpretation: No acute abnormality identified on chest x-ray  Official radiology report(s): DG Chest Portable 1 View  Result Date: 11/23/2020 CLINICAL DATA:  Chest pain EXAM: PORTABLE CHEST 1 VIEW COMPARISON:  09/17/2020 FINDINGS: Lungs are well expanded, symmetric, and clear. No pneumothorax or pleural effusion. Cardiac size within normal limits. Pulmonary vascularity is normal. Osseous structures are age-appropriate. Multiple healed right rib fractures are again noted. No acute bone abnormality. IMPRESSION: No active disease. Electronically Signed   By: Fidela Salisbury MD   On: 11/23/2020 01:33    ____________________________________________   PROCEDURES   Procedure(s) performed (including Critical Care):  .1-3 Lead EKG Interpretation Performed by: Hinda Kehr, MD Authorized by: Hinda Kehr, MD     Interpretation: abnormal     ECG rate:  147   ECG rate assessment: tachycardic     Rhythm: atrial flutter     Ectopy: none     Conduction: normal   .1-3 Lead EKG Interpretation Performed by: Hinda Kehr, MD Authorized by: Hinda Kehr, MD     Interpretation: abnormal     ECG rate:  101   ECG rate assessment: tachycardic     Rhythm: atrial fibrillation     Ectopy: none     Conduction: normal    .Critical Care Performed by: Hinda Kehr, MD Authorized by: Hinda Kehr, MD   Critical care provider statement:    Critical care time (minutes):  45   Critical care time was exclusive of:  Separately billable procedures and treating other patients   Critical care was necessary to treat or prevent imminent or life-threatening deterioration of the following conditions:  Circulatory failure (new onset atrial fibrillation requiring 2 doses of diltiazem, possible ACS requiring heparin)   Critical care was time spent personally by me on the following activities:  Development of treatment plan with patient or surrogate, discussions with consultants, evaluation of patient's response to treatment, examination of patient, obtaining history from patient or surrogate, ordering and performing treatments and interventions, ordering and review of laboratory studies, ordering and review of radiographic studies, pulse oximetry, re-evaluation of patient's condition and review of old charts     ____________________________________________   INITIAL IMPRESSION / MDM / [place] / ED COURSE  As part of my medical decision making, I reviewed the following data within the [facility] notes reviewed and incorporated, Labs reviewed , EKG interpreted , Old EKG reviewed, Old chart reviewed, Radiograph reviewed , Discussed with admitting physician  and Notes from prior ED visits   Differential diagnosis includes, but is not limited to, new onset A. fib, ACS, PE, pneumonia, electrolyte or metabolic abnormality.  The patient is on the cardiac monitor to evaluate for evidence of arrhythmia and/or significant heart rate changes.  Patient has no infectious signs or symptoms.  He is significantly hypertensive and tachycardic at about 149 with a few beats of irregularity.  Initial EKG was questionable for SVT versus a flutter with a 2-1 conduction but I believe it was most likely atrial  flutter/fib.  He is having no infectious signs or symptoms and the symptoms were very acute in onset and severe and he described crushing central chest pressure and feeling like there was a fist pushing down on him.  This is very abnormal for him and he has not had the symptoms in the past.  He took a full dose aspirin prior to arrival and is on prasugrel.  I will attempt to control the rate with diltiazem 15 mg IV and then reassess.  Anticipate he will need admission given the concern for possible ACS as well as  new onset A. fib.     Clinical Course as of 11/23/20 4008  Sat Nov 23, 2020  0133 The patient had a brief improvement of his rate which lowered to the 100-110 range.  I went back to reassess and talk with him that on the monitor he was clearly in atrial fibrillation.  However by the course of our conversation over a few minutes he had gone back up to his original rate of about 147.  I am giving him a second dose of diltiazem, this time 25 mg IV.  His blood pressure has continued to be appropriate and adequate at about 157/100.  I reviewed the medical record and the patient has no history of A. fib.  He has a high-sensitivity troponin of 73 which likely is rate related demand ischemia but he could have also had an acute cardiac event which led to not only his symptoms of crushing chest pressure but also of the new arrhythmia.  He takes prasugrel chronically so I will check with pharmacy regarding recommendations for anticoagulation given possible ACS as well as new onset A. fib, particular in the setting of the nationwide shortage of heparin.  Basic metabolic panel is essentially normal other than some mild hyperglycemia.  CBC is normal.  The rest of the lab work is pending. [CF]  [phone] Of note, the patient said that his chest pressure improved slightly and was mild after his rate improved. [CF]  0138 Verified with Corene Cornea and the pharmacy that heparin is not on shortage for [place] as of right now.  He  recommended bolus plus infusion for the patient's symptoms given the pressor grill is an antiplatelet agent only.  I am ordering the heparin bolus plus infusion for possible ACS and for new onset A. fib and will reassess once labs are back [CF]  0139 DG Chest Portable 1 View I personally reviewed the patient's imaging and agree with the radiologist's interpretation that there is no evidence of acute abnormality on chest x-ray. [CF]  0140 Resp Panel by RT-PCR (Flu A&B, Covid) Nasopharyngeal Swab Although the patient is not having respiratory symptoms, I am sending the to our PCR Covid test given that there is a least chance he may decompensate from a cardiac perspective and require emergent catheterization. [CF]  0140 Patient had good improvement of heart rate after second dose of diltiazem by the.  Heart rate is now between about 80-105.  Ordered diltiazem 120 mg PO. [CF]  0211 consulting hospitalist for admission [CF]  0211 Discussed case in person with Dr. Damita Dunnings with the hospitalist service who will admit. [CF]  0226 Of note the rest of his labs are reassuring.  He has a BNP of 15.4, normal magnesium, normal lipase, normal coags, normal hepatic function panel, normal CBC.  The only significant lab abnormality was an elevated troponin at 73, demand ischemia versus NSTEMI. [CF]    Clinical Course User Index [CF] Hinda Kehr, MD     ____________________________________________  FINAL CLINICAL IMPRESSION(S) / ED DIAGNOSES  Final diagnoses:  New onset atrial fibrillation ([place])  Chest pressure  Demand ischemia ([place])  Elevated troponin level     MEDICATIONS GIVEN DURING THIS VISIT:  Medications  heparin ADULT infusion 100 units/mL (25000 units/242mL) (1,300 Units/hr Intravenous New Bag/Given 11/23/20 0213)  diltiazem (CARDIZEM) injection 15 mg (15 mg Intravenous Given 11/23/20 0108)  diltiazem (CARDIZEM) injection 25 mg (25 mg Intravenous Given 11/23/20 0141)  heparin bolus via infusion  4,000 Units (4,000 Units Intravenous Bolus from  Bag 11/23/20 0225)  diltiazem (CARDIZEM) tablet 120 mg (120 mg Oral Given 11/23/20 0209)     ED Discharge Orders    None      *Please note:  Philip Cook was evaluated in Emergency Department on 11/23/2020 for the symptoms described in the history of present illness. He was evaluated in the context of the global COVID-19 pandemic, which necessitated consideration that the patient might be at risk for infection with the SARS-CoV-2 virus that causes COVID-19. Institutional protocols and algorithms that pertain to the evaluation of patients at risk for COVID-19 are in a state of rapid change based on information released by regulatory bodies including the CDC and federal and state organizations. These policies and algorithms were followed during the patient's care in the ED.  Some ED evaluations and interventions may be delayed as a result of limited staffing during and after the pandemic.*  Note:  This document was prepared using Dragon voice recognition software and may include unintentional dictation errors.   Hinda Kehr, MD 11/23/20 [phone]

## 2020-11-23 NOTE — Progress Notes (Signed)
ANTICOAGULATION CONSULT NOTE - Initial Consult  Pharmacy Consult for Heparin  Indication: chest pain/ACS  Allergies  Allergen Reactions  . Trulicity [Dulaglutide] Nausea And Vomiting  . Sulfa Antibiotics Rash    Patient Measurements: Height: 5\' 10"  (177.8 cm) Weight: 108.4 kg (239 lb) IBW/kg (Calculated) : 73 Heparin Dosing Weight: 96.4 kg   Vital Signs: Temp: 99.4 F (37.4 C) (03/05 0030) Temp Source: Oral (03/05 0030) BP: 153/91 (03/05 0053) Pulse Rate: 149 (03/05 0030)  Labs: Recent Labs    11/23/20 0038  HGB 15.4  HCT 45.7  PLT 204  LABPROT 12.6  INR 1.0  CREATININE 0.91  TROPONINIHS 73*    Estimated Creatinine Clearance: 110.5 mL/min (by C-G formula based on SCr of 0.91 mg/dL).   Medical History: Past Medical History:  Diagnosis Date  . CHF (congestive heart failure) ([place])   . Coronary artery disease   . Diabetes mellitus without complication ([place])   . History of kidney stones   . Hx MRSA infection   . Hyperlipidemia   . Hypertension   . Kidney stone   . Sleep apnea     Medications:  (Not in a hospital admission)   Assessment: Pharmacy consulted to dose heparin in this 60 year old male admitted with ACS/NSTEMI.  CrCl = 110.5 ml/min No prior anticoag noted.   Goal of Therapy:  Heparin level 0.3-0.7 units/ml Monitor platelets by anticoagulation protocol: Yes   Plan:  Give 4000 units bolus x 1 Start heparin infusion at 1300 units/hr Check anti-Xa level in 6 hours and daily while on heparin Continue to monitor H&H and platelets  Cleotha Tsang D 11/23/2020,1:46 AM

## 2020-11-23 NOTE — ED Triage Notes (Signed)
Pt to ED via EMS from home, pt started having chest pain that radiated to his jaw around 11pm tonight, pt states he has hx of HF. Pt started feeling palpitations. Pt took 1 nitro and 324asa PTA.

## 2020-11-26 ENCOUNTER — Other Ambulatory Visit: Payer: Self-pay

## 2020-12-03 ENCOUNTER — Other Ambulatory Visit: Payer: Self-pay

## 2020-12-03 DIAGNOSIS — I5032 Chronic diastolic (congestive) heart failure: Secondary | ICD-10-CM | POA: Diagnosis not present

## 2020-12-03 NOTE — Progress Notes (Signed)
Daily Session Note  Patient Details  Name: Philip Cook MRN: [id number] Date of Birth: Jun 22, 1961 Referring Provider:   Flowsheet Row Cardiac Rehab from 10/24/2020 in [facility] Cardiac [facility] Pulmonary [facility]  Referring Provider Isaias Cowman MD      Encounter Date: 12/03/2020  Check In:  Session Check In - 12/03/20 0948      Check-In   Supervising physician immediately available to respond to emergencies See telemetry face sheet for immediately available ER MD    Location [facility]-Cardiac & Pulmonary Rehab    Staff Present Birdie Sons, MPA, Elveria Rising, BA, ACSM CEP, Exercise Physiologist;Kara Eliezer Bottom, MS Exercise Physiologist    Virtual Visit No    Medication changes reported     No    Fall or balance concerns reported    No    Tobacco Cessation No Change    Warm-up and Cool-down Performed on first and last piece of equipment    Resistance Training Performed Yes    VAD Patient? No    PAD/SET Patient? No      Pain Assessment   Currently in Pain? No/denies              Social History   Tobacco Use  Smoking Status Never Smoker  Smokeless Tobacco Never Used    Goals Met:  Independence with exercise equipment Exercise tolerated well No report of cardiac concerns or symptoms Strength training completed today  Goals Unmet:  Not Applicable  Comments: Pt able to follow exercise prescription today without complaint.  Will continue to monitor for progression.    Dr. Emily Filbert is Medical Director for [facility].

## 2020-12-05 ENCOUNTER — Other Ambulatory Visit: Payer: Self-pay

## 2020-12-05 DIAGNOSIS — I5032 Chronic diastolic (congestive) heart failure: Secondary | ICD-10-CM | POA: Diagnosis not present

## 2020-12-05 NOTE — Progress Notes (Signed)
Daily Session Note  Patient Details  Name: RODELL MARRS MRN: [id number] Date of Birth: 10/05/1960 Referring Provider:   Flowsheet Row Cardiac Rehab from 10/24/2020 in [facility] Cardiac and Pul[facility] Rehab  Referring Provider Isaias Cowman MD      Encounter Date: 12/05/2020  Check In:  Session Check In - 12/05/20 0954      Check-In   Supervising physician immediately available to respond to emergencies See telemetry face sheet for immediately available ER MD    Location [facility]-Cardiac & Pulmonary Rehab    Staff Present Birdie Sons, MPA, RN;Amanda Oletta Darter, BA, ACSM CEP, Exercise Physiologist;Melissa Caiola RDN, LDN    Virtual Visit No    Medication changes reported     No    Fall or balance concerns reported    No    Tobacco Cessation No Change    Warm-up and Cool-down Performed on first and last piece of equipment    Resistance Training Performed Yes    VAD Patient? No    PAD/SET Patient? No      Pain Assessment   Currently in Pain? No/denies              Social History   Tobacco Use  Smoking Status Never Smoker  Smokeless Tobacco Never Used    Goals Met:  Independence with exercise equipment Exercise tolerated well No report of cardiac concerns or symptoms Strength training completed today  Goals Unmet:  Not Applicable  Comments: Pt able to follow exercise prescription today without complaint.  Will continue to monitor for progression.    Dr. Emily Filbert is Medical Director for [facility] and [facility].

## 2020-12-11 ENCOUNTER — Encounter: Payer: Self-pay | Admitting: *Deleted

## 2020-12-11 DIAGNOSIS — I5032 Chronic diastolic (congestive) heart failure: Secondary | ICD-10-CM

## 2020-12-11 NOTE — Progress Notes (Signed)
Cardiac Individual Treatment Plan  Patient Details  Name: Philip Cook MRN: [id number] Date of Birth: 1961/05/01 Referring Provider:   Flowsheet Row Cardiac Rehab from 10/24/2020 in [facility] Cardiac and Pulmonary Rehab  Referring Provider Isaias Cowman MD      Initial Encounter Date:  Flowsheet Row Cardiac Rehab from 10/24/2020 in [facility] Cardiac and Pulmonary Rehab  Date 10/24/20      Visit Diagnosis: Chronic diastolic heart failure ([place])  Patient's Home Medications on Admission:  Current Outpatient Medications:  .  apixaban (ELIQUIS) 5 MG TABS tablet, Take 1 tablet (5 mg total) by mouth 2 (two) times daily., Disp: 60 tablet, Rfl: 0 .  diltiazem (CARDIZEM CD) 180 MG 24 hr capsule, Take 1 capsule (180 mg total) by mouth daily., Disp: 30 capsule, Rfl: 0 .  furosemide (LASIX) 20 MG tablet, Take 1 tablet (20 mg total) by mouth daily., Disp: 30 tablet, Rfl: 0 .  HUMULIN R 500 UNIT/ML injection, Inject into the skin. Use in insulin pump, Disp: , Rfl:  .  Insulin Human (INSULIN PUMP) SOLN, Inject into the skin. , Disp: , Rfl:  .  Multiple Vitamin (MULTIVITAMIN WITH MINERALS) TABS tablet, Take 1 tablet by mouth daily., Disp: , Rfl:  .  oxybutynin (DITROPAN XL) 10 MG 24 hr tablet, Take 1 tablet (10 mg total) by mouth daily as needed (bladder spasm/stent pain). (Patient not taking: No sig reported), Disp: 30 tablet, Rfl: 0 .  prasugrel (EFFIENT) 10 MG TABS tablet, Take 10 mg by mouth daily., Disp: , Rfl:  .  rosuvastatin (CRESTOR) 10 MG tablet, Take 1 tablet (10 mg total) by mouth daily., Disp: 30 tablet, Rfl: 0 .  tamsulosin (FLOMAX) 0.4 MG CAPS capsule, Take 1 capsule (0.4 mg total) by mouth daily after supper. (Patient not taking: No sig reported), Disp: 14 capsule, Rfl: 0  Past Medical History: Past Medical History:  Diagnosis Date  . CHF (congestive heart failure) ([place])   . Coronary artery disease   . Diabetes mellitus without complication ([place])   . History of kidney stones   . Hx  MRSA infection   . Hyperlipidemia   . Hypertension   . Kidney stone   . Sleep apnea     Tobacco Use: Social History   Tobacco Use  Smoking Status Never Smoker  Smokeless Tobacco Never Used    Labs: Recent Review Flowsheet Data    Labs for ITP Cardiac and Pulmonary Rehab Latest Ref Rng & Units 08/30/2020 11/23/2020   Hemoglobin A1c 4.8 - 5.6 % 8.3(H) 8.2(H)       Exercise Target Goals: Exercise Program Goal: Individual exercise prescription set using results from initial 6 min walk test and THRR while considering  patient's activity barriers and safety.   Exercise Prescription Goal: Initial exercise prescription builds to 30-45 minutes a day of aerobic activity, 2-3 days per week.  Home exercise guidelines will be given to patient during program as part of exercise prescription that the participant will acknowledge.   Education: Aerobic Exercise: - Group verbal and visual presentation on the components of exercise prescription. Introduces F.I.T.T principle from ACSM for exercise prescriptions.  Reviews F.I.T.T. principles of aerobic exercise including progression. Written material given at graduation. Flowsheet Row Cardiac Rehab from 12/05/2020 in [facility] Cardiac and Pulmonary Rehab  Date 11/07/20  Educator jh  Instruction Review Code 1- Verbalizes Understanding      Education: Resistance Exercise: - Group verbal and visual presentation on the components of exercise prescription. Introduces F.I.T.T principle from ACSM  for exercise prescriptions  Reviews F.I.T.T. principles of resistance exercise including progression. Written material given at graduation.    Education: Exercise & Equipment Safety: - Individual verbal instruction and demonstration of equipment use and safety with use of the equipment. Flowsheet Row Cardiac Rehab from 12/05/2020 in [facility] Cardiac and Pulmonary Rehab  Education need identified 10/24/20  Date 10/24/20  Educator [place]  Instruction Review Code 1-  Verbalizes Understanding      Education: Exercise Physiology & General Exercise Guidelines: - Group verbal and written instruction with models to review the exercise physiology of the cardiovascular system and associated critical values. Provides general exercise guidelines with specific guidelines to those with heart or lung disease.  Flowsheet Row Cardiac Rehab from 12/05/2020 in [facility] Cardiac and Pulmonary Rehab  Date 10/31/20  Educator [facility]  Instruction Review Code 1- Verbalizes Understanding      Education: Flexibility, Balance, Mind/Body Relaxation: - Group verbal and visual presentation with interactive activity on the components of exercise prescription. Introduces F.I.T.T principle from ACSM for exercise prescriptions. Reviews F.I.T.T. principles of flexibility and balance exercise training including progression. Also discusses the mind body connection.  Reviews various relaxation techniques to help reduce and manage stress (i.e. Deep breathing, progressive muscle relaxation, and visualization). Balance handout provided to take home. Written material given at graduation. Flowsheet Row Cardiac Rehab from 09/15/2017 in [facility] Cardiac and Pulmonary Rehab  Date 06/02/17  Educator AS  Instruction Review Code 1- Verbalizes Understanding      Activity Barriers & Risk Stratification:  Activity Barriers & Cardiac Risk Stratification - 10/24/20 1351      Activity Barriers & Cardiac Risk Stratification   Activity Barriers Arthritis;Deconditioning    Cardiac Risk Stratification High           6 Minute Walk:  6 Minute Walk    Row Name 10/24/20 1346         6 Minute Walk   Phase Initial     Distance 1250 feet     Walk Time 6 minutes     # of Rest Breaks 0     MPH 2.36     METS 2.33     RPE 13     Perceived Dyspnea  1     VO2 Peak 8.15     Symptoms Yes (comment)     Comments Hip pain 3/10     Resting HR 69 bpm     Resting BP 118/62     Resting Oxygen Saturation  97 %      Exercise Oxygen Saturation  during 6 min walk 98 %     Max Ex. HR 97 bpm     Max Ex. BP 154/64     2 Minute Post BP 128/66            Oxygen Initial Assessment:   Oxygen Re-Evaluation:   Oxygen Discharge (Final Oxygen Re-Evaluation):   Initial Exercise Prescription:  Initial Exercise Prescription - 10/24/20 1300      Date of Initial Exercise RX and Referring Provider   Date 10/24/20    Referring Provider Isaias Cowman MD      Treadmill   MPH 1.7    Grade 0    Minutes 15    METs 2.3      Recumbant Bike   Level 2    RPM 60    Watts 15    Minutes 15    METs 2.3      NuStep   Level 2  SPM 80    Minutes 15    METs 2.3      T5 Nustep   Level 1    SPM 80    Minutes 15    METs 2.3      Biostep-RELP   Level 1    SPM 50    Minutes 15    METs 2.3      Prescription Details   Frequency (times per week) 2    Duration Progress to 30 minutes of continuous aerobic without signs/symptoms of physical distress      Intensity   THRR 40-80% of Max Heartrate 106-144    Ratings of Perceived Exertion 11-13    Perceived Dyspnea 0-4      Progression   Progression Continue to progress workloads to maintain intensity without signs/symptoms of physical distress.      Resistance Training   Training Prescription Yes    Weight 3 lb    Reps 10-15           Perform Capillary Blood Glucose checks as needed.  Exercise Prescription Changes:  Exercise Prescription Changes    Row Name 10/24/20 1300 11/04/20 1600 11/14/20 1000 11/19/20 1500 12/03/20 1300     Response to Exercise   Blood Pressure (Admit) 118/62 142/70 -- 132/76 152/54   Blood Pressure (Exercise) 154/64 178/88 -- 154/78 146/68   Blood Pressure (Exit) 128/66 124/68 -- 122/74 138/64   Heart Rate (Admit) 69 bpm 88 bpm -- 73 bpm 82 bpm   Heart Rate (Exercise) 97 bpm 102 bpm -- 105 bpm 101 bpm   Heart Rate (Exit) 68 bpm 82 bpm -- 80 bpm 72 bpm   Oxygen Saturation (Admit) 97 % -- -- -- --   Oxygen  Saturation (Exercise) 98 % -- -- -- --   Oxygen Saturation (Exit) 96 % -- -- -- --   Rating of Perceived Exertion (Exercise) 13 12 -- 13 14   Perceived Dyspnea (Exercise) 1 -- -- -- --   Symptoms hip pain 3/10 -- -- none none   Comments walk test results second day exercise -- -- --   Duration -- Continue with 30 min of aerobic exercise without signs/symptoms of physical distress. -- Continue with 30 min of aerobic exercise without signs/symptoms of physical distress. Continue with 30 min of aerobic exercise without signs/symptoms of physical distress.   Intensity -- THRR unchanged -- THRR unchanged THRR unchanged     Progression   Progression -- Continue to progress workloads to maintain intensity without signs/symptoms of physical distress. -- Continue to progress workloads to maintain intensity without signs/symptoms of physical distress. Continue to progress workloads to maintain intensity without signs/symptoms of physical distress.   Average METs -- 2.48 -- 2.34 2.4     Resistance Training   Training Prescription -- Yes -- Yes Yes   Weight -- 3 lb -- 3 lb 3 lb   Reps -- 10-15 -- 10-15 10-15     Interval Training   Interval Training -- -- -- No No     Treadmill   MPH -- 2 -- 2.2 1.9   Grade -- 0 -- 0 --   Minutes -- 15 -- 15 15   METs -- 2.76 -- 2.68 2.45     Recumbant Bike   Level -- -- -- 2 --   Minutes -- -- -- 15 --   METs -- -- -- 2.6 --     NuStep   Level -- -- -- 2 --  Minutes -- -- -- 15 --   METs -- -- -- 2.2 --     T5 Nustep   Level -- 3 -- 3 3   SPM -- 80 -- -- --   Minutes -- 15 -- 15 15   METs -- 2.2 -- 1.9 2.1     Home Exercise Plan   Plans to continue exercise at -- -- Home (comment)  walk, staff videos, considering [place] (comment)  walk, staff videos, considering [place] (comment)  walk, staff videos, considering [employer]   Frequency -- -- Add 3 additional days to program exercise sessions. Add 3 additional days to  program exercise sessions. Add 3 additional days to program exercise sessions.   Initial Home Exercises Provided -- -- 11/14/20 11/14/20 11/14/20          Exercise Comments:  Exercise Comments    Row Name 10/29/20 1017           Exercise Comments First full day of exercise!  Patient was oriented to gym and equipment including functions, settings, policies, and procedures.  Patient's individual exercise prescription and treatment plan were reviewed.  All starting workloads were established based on the results of the 6 minute walk test done at initial orientation visit.  The plan for exercise progression was also introduced and progression will be customized based on patient's performance and goals.              Exercise Goals and Review:  Exercise Goals    Row Name 10/24/20 1351             Exercise Goals   Increase Physical Activity Yes       Intervention Provide advice, education, support and counseling about physical activity/exercise needs.;Develop an individualized exercise prescription for aerobic and resistive training based on initial evaluation findings, risk stratification, comorbidities and participant's personal goals.       Expected Outcomes Short Term: Attend rehab on a regular basis to increase amount of physical activity.;Long Term: Add in home exercise to make exercise part of routine and to increase amount of physical activity.;Long Term: Exercising regularly at least 3-5 days a week.       Increase Strength and Stamina Yes       Intervention Provide advice, education, support and counseling about physical activity/exercise needs.;Develop an individualized exercise prescription for aerobic and resistive training based on initial evaluation findings, risk stratification, comorbidities and participant's personal goals.       Expected Outcomes Short Term: Increase workloads from initial exercise prescription for resistance, speed, and METs.;Short Term: Perform  resistance training exercises routinely during rehab and add in resistance training at home;Long Term: Improve cardiorespiratory fitness, muscular endurance and strength as measured by increased METs and functional capacity (6MWT)       Able to understand and use rate of perceived exertion (RPE) scale Yes       Intervention Provide education and explanation on how to use RPE scale       Expected Outcomes Short Term: Able to use RPE daily in rehab to express subjective intensity level;Long Term:  Able to use RPE to guide intensity level when exercising independently       Able to understand and use Dyspnea scale Yes       Intervention Provide education and explanation on how to use Dyspnea scale       Expected Outcomes Short Term: Able to use Dyspnea scale daily in rehab to express subjective sense of  shortness of breath during exertion;Long Term: Able to use Dyspnea scale to guide intensity level when exercising independently       Knowledge and understanding of Target Heart Rate Range (THRR) Yes       Intervention Provide education and explanation of THRR including how the numbers were predicted and where they are located for reference       Expected Outcomes Short Term: Able to state/look up THRR;Short Term: Able to use daily as guideline for intensity in rehab;Long Term: Able to use THRR to govern intensity when exercising independently       Able to check pulse independently Yes       Intervention Provide education and demonstration on how to check pulse in carotid and radial arteries.;Review the importance of being able to check your own pulse for safety during independent exercise       Expected Outcomes Short Term: Able to explain why pulse checking is important during independent exercise;Long Term: Able to check pulse independently and accurately       Understanding of Exercise Prescription Yes       Intervention Provide education, explanation, and written materials on patient's individual  exercise prescription       Expected Outcomes Short Term: Able to explain program exercise prescription;Long Term: Able to explain home exercise prescription to exercise independently              Exercise Goals Re-Evaluation :  Exercise Goals Re-Evaluation    Row Name 10/29/20 1017 11/04/20 1616 11/14/20 1036 11/19/20 1525 12/03/20 1009     Exercise Goal Re-Evaluation   Exercise Goals Review Able to understand and use rate of perceived exertion (RPE) scale;Able to understand and use Dyspnea scale;Knowledge and understanding of Target Heart Rate Range (THRR);Understanding of Exercise Prescription Increase Physical Activity;Increase Strength and Stamina Increase Physical Activity;Increase Strength and Stamina;Able to understand and use rate of perceived exertion (RPE) scale;Able to understand and use Dyspnea scale;Knowledge and understanding of Target Heart Rate Range (THRR);Able to check pulse independently;Understanding of Exercise Prescription Increase Physical Activity;Increase Strength and Stamina;Understanding of Exercise Prescription Increase Physical Activity;Increase Strength and Stamina   Comments Reviewed RPE and dyspnea scales, THR and program prescription with pt today.  Pt voiced understanding and was given a copy of goals to take home. [name] has tolerated exercise well in his first sessions.  He has increased TM to 2 mph. We will continue to monitor progress. Reviewed home exercise with pt today.  Pt plans to walk and use staff videos for exercise.  He and his wife are also planning to join [employer].  Reviewed THR, pulse, RPE, sign and symptoms, pulse oximetery and when to call 911 or MD.  Also discussed weather considerations and indoor options.  Pt voiced understanding. [name] is doing well in rehab.  He is now up to 2.2 mph on the treadmill.  We will continue to monitor his progress. [name] is back today after getting clearance due to new onset afib. He started back at 1.9 mph today.  We will monitor progress.   Expected Outcomes Short: Use RPE daily to regulate intensity. Long: Follow program prescription in THR. Short:  attend consistently Long:  improve overall MET level Short: Start to add in more exercise at home Long; Continue to exercise indpendently. Short: Increase level on NuStep Long: Continue to improve stamina. Short:  get back to consistent attendance Long:  continue to build stamina          Discharge Exercise Prescription (Final  Exercise Prescription Changes):  Exercise Prescription Changes - 12/03/20 1300      Response to Exercise   Blood Pressure (Admit) 152/54    Blood Pressure (Exercise) 146/68    Blood Pressure (Exit) 138/64    Heart Rate (Admit) 82 bpm    Heart Rate (Exercise) 101 bpm    Heart Rate (Exit) 72 bpm    Rating of Perceived Exertion (Exercise) 14    Symptoms none    Duration Continue with 30 min of aerobic exercise without signs/symptoms of physical distress.    Intensity THRR unchanged      Progression   Progression Continue to progress workloads to maintain intensity without signs/symptoms of physical distress.    Average METs 2.4      Resistance Training   Training Prescription Yes    Weight 3 lb    Reps 10-15      Interval Training   Interval Training No      Treadmill   MPH 1.9    Minutes 15    METs 2.45      T5 Nustep   Level 3    Minutes 15    METs 2.1      Home Exercise Plan   Plans to continue exercise at Home (comment)   walk, staff videos, considering [employer]   Frequency Add 3 additional days to program exercise sessions.    Initial Home Exercises Provided 11/14/20           Nutrition:  Target Goals: Understanding of nutrition guidelines, daily intake of sodium <1555m, cholesterol <2037m calories 30% from fat and 7% or less from saturated fats, daily to have 5 or more servings of fruits and vegetables.  Education: All About Nutrition: -Group instruction provided by verbal, written  material, interactive activities, discussions, models, and posters to present general guidelines for heart healthy nutrition including fat, fiber, MyPlate, the role of sodium in heart healthy nutrition, utilization of the nutrition label, and utilization of this knowledge for meal planning. Follow up email sent as well. Written material given at graduation. Flowsheet Row [facility]  Date 09/06/17  Educator PI  Instruction Review Code 1- Verbalizes Understanding      Biometrics:  Pre Biometrics - 10/24/20 1351      Pre Biometrics   Height 5' 10"  (1.778 m)    Weight 343 lb 1.6 oz (155.6 kg)    BMI (Calculated) 49.23    Single Leg Stand 18.7 seconds            Nutrition Therapy Plan and Nutrition Goals:  Nutrition Therapy & Goals - 11/05/20 0848      Nutrition Therapy   Diet Heart healthy, low Na, diabetes friendly    Drug/Food Interactions Statins/Certain Fruits    Protein (specify units) 120g    Fiber 30 grams    Whole Grain Foods 3 servings    Saturated Fats 12 max. grams    Fruits and Vegetables 8 servings/day    Sodium 1.5 grams      Personal Nutrition Goals   Nutrition Goal ST: check in with feelings before, during, and after eating. create list of foods he enjoys. Try to drink more water instead of diet soda. LT: feel in control around food, prepare more meals and have snacks on hand.    Comments He is on insulin. He feels he is very picky - he doesn't like fish. he doesn't care for boiled or baked chicken -  texture. He enjoys [place] food. he also likes diet mountain dew, but would like to drink more water even though he doesn't care for it. He has been trying to eat high protein. he counts his CHO. He only likes apples for fruit. He feels out of control with food. He feels that he is out of control with food and feels that he wants to make changes, but in the moment is hard. Discussed heart healthy eating and diabetes  friendly eating.  Discussed mindful eating and eating more intuitively - leftovers are ok and its ok if meals aren't perfect. Asked him to provide list of foods he enjoys and is willing to try to help make a plan for him. Asked him to observe how he feels before, during, and after eating food and where he sits on the hunger scale to help address feeling out of control around food. His wife is depressed and wants him to sit on the couch with her which causes him to midlessly eat - encouraged staying busy to avoid boredom and choosing lower calorie snacks when eating outside of hunger.      Intervention Plan   Intervention Prescribe, educate and counsel regarding individualized specific dietary modifications aiming towards targeted core components such as weight, hypertension, lipid management, diabetes, heart failure and other comorbidities.;Nutrition handout(s) given to patient.    Expected Outcomes Short Term Goal: Understand basic principles of dietary content, such as calories, fat, sodium, cholesterol and nutrients.;Short Term Goal: A plan has been developed with personal nutrition goals set during dietitian appointment.;Long Term Goal: Adherence to prescribed nutrition plan.           Nutrition Assessments:  MEDIFICTS Score Key:  ?70 Need to make dietary changes   40-70 Heart Healthy Diet  ? 40 Therapeutic Level Cholesterol Diet  Flowsheet Row Cardiac Rehab from 10/29/2020 in [facility] Cardiac and Pulmonary Rehab  Picture Your Plate Total Score on Admission 56     Picture Your Plate Scores:  <40 Unhealthy dietary pattern with much room for improvement.  41-50 Dietary pattern unlikely to meet recommendations for good health and room for improvement.  51-60 More healthful dietary pattern, with some room for improvement.   >60 Healthy dietary pattern, although there may be some specific behaviors that could be improved.    Nutrition Goals Re-Evaluation:  Nutrition Goals Re-Evaluation     [place] Name 12/03/20 1014             Goals   Current Weight 342 lb 9.6 oz (155.4 kg)       Nutrition Goal His cardiologist recommends the DASH diet.  He is making a list of foods he is and isnt willing to try for the RD.       Expected Outcome Short: meet with RD Long: use diet to help manage risk factors successfully              Nutrition Goals Discharge (Final Nutrition Goals Re-Evaluation):  Nutrition Goals Re-Evaluation - 12/03/20 1014      Goals   Current Weight 342 lb 9.6 oz (155.4 kg)    Nutrition Goal His cardiologist recommends the DASH diet.  He is making a list of foods he is and isnt willing to try for the RD.    Expected Outcome Short: meet with RD Long: use diet to help manage risk factors successfully           Psychosocial: Target Goals: Acknowledge presence or absence of significant depression and/or stress, maximize  coping skills, provide positive support system. Participant is able to verbalize types and ability to use techniques and skills needed for reducing stress and depression.   Education: Stress, Anxiety, and Depression - Group verbal and visual presentation to define topics covered.  Reviews how body is impacted by stress, anxiety, and depression.  Also discusses healthy ways to reduce stress and to treat/manage anxiety and depression.  Written material given at graduation. Flowsheet Row Cardiac Rehab from 09/15/2017 in [facility] Cardiac and Pulmonary Rehab  Date 07/07/17  Educator [facility]  Instruction Review Code 1- [employer] Understanding      Education: Sleep Hygiene -Provides group verbal and written instruction about how sleep can affect your health.  Define sleep hygiene, discuss sleep cycles and impact of sleep habits. Review good sleep hygiene tips.    Initial Review & Psychosocial Screening:  Initial Psych Review & Screening - 10/17/20 0916      Initial Review   Current issues with None Identified      Family Dynamics   Good Support  System? Yes   Wife and children     Barriers   Psychosocial barriers to participate in program There are no identifiable barriers or psychosocial needs.      Screening Interventions   Interventions Encouraged to exercise    Expected Outcomes Short Term goal: Utilizing psychosocial counselor, staff and physician to assist with identification of specific Stressors or current issues interfering with healing process. Setting desired goal for each stressor or current issue identified.;Long Term Goal: Stressors or current issues are controlled or eliminated.;Short Term goal: Identification and review with participant of any Quality of Life or Depression concerns found by scoring the questionnaire.;Long Term goal: The participant improves quality of Life and PHQ9 Scores as seen by post scores and/or verbalization of changes           Quality of Life Scores:   Quality of Life - 10/24/20 0942      Quality of Life   Select Quality of Life      Quality of Life Scores   Health/Function Pre 14.9 %    Socioeconomic Pre 22.5 %    Psych/Spiritual Pre 23.43 %    Family Pre 21.6 %    GLOBAL Pre 19.21 %          Scores of 19 and below usually indicate a poorer quality of life in these areas.  A difference of  2-3 points is a clinically meaningful difference.  A difference of 2-3 points in the total score of the Quality of Life Index has been associated with significant improvement in overall quality of life, self-image, physical symptoms, and general health in studies assessing change in quality of life.  PHQ-9: Recent Review Flowsheet Data    Depression screen [facility] 2/9 10/24/2020 12/28/2017 12/28/2017 09/28/2017 09/15/2017   Decreased Interest 1 0 0 0 1   Down, Depressed, Hopeless 0 0 0 0 0   PHQ - 2 Score 1 0 0 0 1   Altered sleeping 0 - - - 0   Tired, decreased energy 1 - - - 1   Change in appetite 2 - - - 1   Feeling bad or failure about yourself  0 - - - 1   Trouble concentrating 0 - - - 0    Moving slowly or fidgety/restless 0 - - - 0   Suicidal thoughts 0 - - - 0   PHQ-9 Score 4 - - - 4   Difficult doing work/chores Somewhat  difficult - - - Not difficult at all     Interpretation of Total Score  Total Score Depression Severity:  1-4 = Minimal depression, 5-9 = Mild depression, 10-14 = Moderate depression, 15-19 = Moderately severe depression, 20-27 = Severe depression   Psychosocial Evaluation and Intervention:  Psychosocial Evaluation - 10/17/20 0937      Psychosocial Evaluation & Interventions   Comments [name] has no barriers to attending the program. He has been through the program before and has had several life changes since last attendance. [name] is retired and his wife is on disability for medical reason. He states that he does not have any more stress than the usual daily life stress. He lives with his wife and 2 teenage daughters, and male dog and cat. House full of women per [name].  He plans to look for a part time job in the future for something to do. He has not been able to lose weight and is talking about how to manage weight loss. He is looking forward to attending the program.    Expected Outcomes STG Attend all scheduled sessions, meet with RD to learn steps toward weight loss LTG: COntinue to manage weight with the tools and resources provided during program, continue to work toward healty lifestyle and stress free life.    Continue Psychosocial Services  Follow up required by staff           Psychosocial Re-Evaluation:  Psychosocial Re-Evaluation    [place] Name 12/03/20 1005             Psychosocial Re-Evaluation   Current issues with Current Stress Concerns;Current Sleep Concerns       Comments [name] says he sleeps like a rock with CPAP.  He and his daughter have both been to ER in the past week and that has caused a little stress.  He likes to do wood work to help with stress.  He also takes breaks from working to help reduce stress.       Expected  Outcomes Short: attend stress management education Long: manage stress long term              Psychosocial Discharge (Final Psychosocial Re-Evaluation):  Psychosocial Re-Evaluation - 12/03/20 1005      Psychosocial Re-Evaluation   Current issues with Current Stress Concerns;Current Sleep Concerns    Comments [name] says he sleeps like a rock with CPAP.  He and his daughter have both been to ER in the past week and that has caused a little stress.  He likes to do wood work to help with stress.  He also takes breaks from working to help reduce stress.    Expected Outcomes Short: attend stress management education Long: manage stress long term           Vocational Rehabilitation: Provide vocational rehab assistance to qualifying candidates.   Vocational Rehab Evaluation & Intervention:  Vocational Rehab - 10/17/20 0919      Initial Vocational Rehab Evaluation & Intervention   Assessment shows need for Vocational Rehabilitation No           Education: Education Goals: Education classes will be provided on a variety of topics geared toward better understanding of heart health and risk factor modification. Participant will state understanding/return demonstration of topics presented as noted by education test scores.  Learning Barriers/Preferences:  Learning Barriers/Preferences - 10/17/20 0919      Learning Barriers/Preferences   Learning Barriers None    Learning Preferences None  General Cardiac Education Topics:  AED/CPR: - Group verbal and written instruction with the use of models to demonstrate the basic use of the AED with the basic ABC's of resuscitation.   Anatomy and Cardiac Procedures: - Group verbal and visual presentation and models provide information about basic cardiac anatomy and function. Reviews the testing methods done to diagnose heart disease and the outcomes of the test results. Describes the treatment choices: Medical Management,  Angioplasty, or Coronary Bypass Surgery for treating various heart conditions including Myocardial Infarction, Angina, Valve Disease, and Cardiac Arrhythmias.  Written material given at graduation. Flowsheet Row Cardiac Rehab from 09/15/2017 in [facility] Cardiac and Pulmonary Rehab  Date 06/07/17  Educator [facility]  Instruction Review Code 1- Verbalizes Understanding      Medication Safety: - Group verbal and visual instruction to review commonly prescribed medications for heart and lung disease. Reviews the medication, class of the drug, and side effects. Includes the steps to properly store meds and maintain the prescription regimen.  Written material given at graduation. Flowsheet Row Cardiac Rehab from 12/05/2020 in [facility] Cardiac and Pulmonary Rehab  Date 12/05/20  Educator [facility]  Instruction Review Code 1- Verbalizes Understanding      Intimacy: - Group verbal instruction through game format to discuss how heart and lung disease can affect sexual intimacy. Written material given at graduation.. Flowsheet Row Cardiac Rehab from 12/05/2020 in [facility] Cardiac and Pulmonary Rehab  Date 11/07/20  Educator j[facility]  Instruction Review Code 1- Verbalizes Understanding      Know Your Numbers and Heart Failure: - Group verbal and visual instruction to discuss disease risk factors for cardiac and pulmonary disease and treatment options.  Reviews associated critical values for Overweight/Obesity, Hypertension, Cholesterol, and Diabetes.  Discusses basics of heart failure: signs/symptoms and treatments.  Introduces Heart Failure Zone chart for action plan for heart failure.  Written material given at graduation. Flowsheet Row Cardiac Rehab from 09/15/2017 in [facility] Cardiac and Pulmonary Rehab  Date 06/07/17  Educator [facility]  Instruction Review Code 1- Verbalizes Understanding      Infection Prevention: - Provides verbal and written material to individual with discussion of infection control including proper hand  washing and proper equipment cleaning during exercise session. Flowsheet Row Cardiac Rehab from 12/05/2020 in [facility] Cardiac and Pulmonary Rehab  Education need identified 10/24/20  Date 10/24/20  Educator [place]  Instruction Review Code 1- Verbalizes Understanding      Falls Prevention: - Provides verbal and written material to individual with discussion of falls prevention and safety. Flowsheet Row Cardiac Rehab from 12/05/2020 in [facility] Cardiac and Pulmonary Rehab  Education need identified 10/24/20  Date 10/24/20  Educator [place]  Instruction Review Code 1- Verbalizes Understanding      Other: -Provides group and verbal instruction on various topics (see comments)   Knowledge Questionnaire Score:  Knowledge Questionnaire Score - 10/24/20 0933      Knowledge Questionnaire Score   Pre Score 22/26: Angina, Nitro, Exercise           Core Components/Risk Factors/Patient Goals at Admission:  Personal Goals and Risk Factors at Admission - 10/24/20 1352      Core Components/Risk Factors/Patient Goals on Admission    Weight Management Yes;Obesity;Weight Loss    Intervention Weight Management: Develop a combined nutrition and exercise program designed to reach desired caloric intake, while maintaining appropriate intake of nutrient and fiber, sodium and fats, and appropriate energy expenditure required for the weight goal.;Weight Management: Provide education and appropriate resources to help participant  work on and attain dietary goals.;Weight Management/Obesity: Establish reasonable short term and long term weight goals.;Obesity: Provide education and appropriate resources to help participant work on and attain dietary goals.    Admit Weight 343 lb (155.6 kg)    Goal Weight: Short Term 338 lb (153.3 kg)    Goal Weight: Long Term 323 lb (146.5 kg)    Expected Outcomes Short Term: Continue to assess and modify interventions until short term weight is achieved;Long Term: Adherence to nutrition  and physical activity/exercise program aimed toward attainment of established weight goal;Weight Loss: Understanding of general recommendations for a balanced deficit meal plan, which promotes 1-2 lb weight loss per week and includes a negative energy balance of [phone] kcal/d    Diabetes Yes    Intervention Provide education about signs/symptoms and action to take for hypo/hyperglycemia.;Provide education about proper nutrition, including hydration, and aerobic/resistive exercise prescription along with prescribed medications to achieve blood glucose in normal ranges: Fasting glucose 65-99 mg/dL    Expected Outcomes Short Term: Participant verbalizes understanding of the signs/symptoms and immediate care of hyper/hypoglycemia, proper foot care and importance of medication, aerobic/resistive exercise and nutrition plan for blood glucose control.;Long Term: Attainment of HbA1C < 7%.    Heart Failure Yes    Intervention Provide a combined exercise and nutrition program that is supplemented with education, support and counseling about heart failure. Directed toward relieving symptoms such as shortness of breath, decreased exercise tolerance, and extremity edema.    Expected Outcomes Improve functional capacity of life;Short term: Attendance in program 2-3 days a week with increased exercise capacity. Reported lower sodium intake. Reported increased fruit and vegetable intake. Reports medication compliance.;Short term: Daily weights obtained and reported for increase. Utilizing diuretic protocols set by physician.;Long term: Adoption of self-care skills and reduction of barriers for early signs and symptoms recognition and intervention leading to self-care maintenance.    Hypertension Yes    Intervention Provide education on lifestyle modifcations including regular physical activity/exercise, weight management, moderate sodium restriction and increased consumption of fresh fruit, vegetables, and low fat dairy,  alcohol moderation, and smoking cessation.;Monitor prescription use compliance.    Expected Outcomes Short Term: Continued assessment and intervention until BP is < 140/32m HG in hypertensive participants. < 130/889mHG in hypertensive participants with diabetes, heart failure or chronic kidney disease.;Long Term: Maintenance of blood pressure at goal levels.    Lipids Yes    Intervention Provide education and support for participant on nutrition & aerobic/resistive exercise along with prescribed medications to achieve LDL <706mHDL >65m63m  Expected Outcomes Short Term: Participant states understanding of desired cholesterol values and is compliant with medications prescribed. Participant is following exercise prescription and nutrition guidelines.;Long Term: Cholesterol controlled with medications as prescribed, with individualized exercise RX and with personalized nutrition plan. Value goals: LDL < 70mg61mL > 40 mg.           Education:Diabetes - Individual verbal and written instruction to review signs/symptoms of diabetes, desired ranges of glucose level fasting, after meals and with exercise. Acknowledge that pre and post exercise glucose checks will be done for 3 sessions at entry of program. [place] [facility]  Education need identified 10/24/20  Date 10/24/20  Educator [place]  IColumbustruction Review Code 1- Verbalizes Understanding      Core Components/Risk Factors/Patient Goals Review:   Goals and Risk Factor Review    Row Name 12/03/20 1000  Core Components/Risk Factors/Patient Goals Review   Personal Goals Review Weight Management/Obesity;Improve shortness of breath with ADL's;Heart Failure;Diabetes       Review [name] is now taking 68m eliquis and Cardizem due to new onset of a fib.  He saw cardiologist yesterday.  He does check BP at home - it has been elevated at 1353-614- systolic - it was 1431at Dr  yesterday.  MAltamese Dillingsays his sugars have been good.  He started Flomax and he feels this has helped.       Expected Outcomes Short: continue to monitor BP/Bg at home  Long: manage risk factors long term              Core Components/Risk Factors/Patient Goals at Discharge (Final Review):   Goals and Risk Factor Review - 12/03/20 1000      Core Components/Risk Factors/Patient Goals Review   Personal Goals Review Weight Management/Obesity;Improve shortness of breath with ADL's;Heart Failure;Diabetes    Review MAltamese Dillingis now taking 544meliquis and Cardizem due to new onset of a fib.  He saw cardiologist yesterday.  He does check BP at home - it has been elevated at 15540-086 systolic - it was 13761t Dr yesterday.  MaAltamese Dillingays his sugars have been good.  He started Flomax and he feels this has helped.    Expected Outcomes Short: continue to monitor BP/Bg at home  Long: manage risk factors long term           ITP Comments:  ITP Comments    Row Name 10/17/20 0933 10/24/20 0932 10/29/20 1017 11/13/20 0640 12/11/20 0750   ITP Comments Virtual orientation call completed today. he has an appointment on Date: 10/24/2020 for EP eval and gym Orientation.  [facility]. Completed 6MWT and gym orientation. Initial ITP created and sent for review to Dr. MaEmily FilbertMedical Director. First full day of exercise!  Patient was oriented to gym and equipment including functions, settings, policies, and procedures.  Patient's individual exercise prescription and treatment plan were reviewed.  All starting workloads were established based on the results of the 6 minute walk test done at initial orientation visit.  The plan for exercise progression was also introduced and progression will be customized based on patient's performance and goals. 30 Day review completed. Medical Director ITP review done, changes made as directed, and signed approval by Medical Director.  30 Day review completed. Medical Director ITP review done, changes made as directed, and signed approval by Medical Director. only 3 visits in March          Comments:

## 2020-12-12 ENCOUNTER — Other Ambulatory Visit: Payer: Self-pay

## 2020-12-12 DIAGNOSIS — I5032 Chronic diastolic (congestive) heart failure: Secondary | ICD-10-CM | POA: Diagnosis not present

## 2020-12-12 NOTE — Progress Notes (Signed)
Daily Session Note  Patient Details  Name: Philip Cook MRN: [id number] Date of Birth: [birth year] Referring Provider:   Flowsheet Row Cardiac Rehab from 10/24/2020 in [facility] Cardiac and Pulmonary Rehab  Referring Provider Isaias Cowman MD      Encounter Date: 12/12/2020  Check In:  Session Check In - 12/12/20 0942      Check-In   Supervising physician immediately available to respond to emergencies See telemetry face sheet for immediately available ER MD    Location [facility]-Cardiac & Pulmonary Rehab    Staff Present Birdie Sons, MPA, RN;Melissa Caiola RDN, Rowe Pavy, BA, ACSM CEP, Exercise Physiologist    Virtual Visit No    Medication changes reported     No    Fall or balance concerns reported    No    Tobacco Cessation No Change    Warm-up and Cool-down Performed on first and last piece of equipment    Resistance Training Performed Yes    VAD Patient? No    PAD/SET Patient? No      Pain Assessment   Currently in Pain? No/denies              Social History   Tobacco Use  Smoking Status Never Smoker  Smokeless Tobacco Never Used    Goals Met:  Independence with exercise equipment Exercise tolerated well No report of cardiac concerns or symptoms Strength training completed today  Goals Unmet:  Not Applicable  Comments: Pt able to follow exercise prescription today without complaint.  Will continue to monitor for progression.    Dr. Emily Filbert is Medical Director for [place] and [facility].

## 2020-12-17 ENCOUNTER — Other Ambulatory Visit: Payer: Self-pay

## 2020-12-17 DIAGNOSIS — I5032 Chronic diastolic (congestive) heart failure: Secondary | ICD-10-CM

## 2020-12-17 NOTE — Progress Notes (Signed)
Daily Session Note  Patient Details  Name: Philip Cook MRN: [id number] Date of Birth: 23-Aug-1961 Referring Provider:   Flowsheet Row Cardiac Rehab from 10/24/2020 in [facility] Cardiac and Pulmonary Rehab  Referring Provider Isaias Cowman MD      Encounter Date: 12/17/2020  Check In:  Session Check In - 12/17/20 0957      Check-In   Supervising physician immediately available to respond to emergencies See telemetry face sheet for immediately available ER MD    Location [facility]-Cardiac & Pulmonary Rehab    Staff Present Birdie Sons, MPA, Elveria Rising, BA, ACSM CEP, Exercise Physiologist;Joseph Tessie Fass RCP,RRT,BSRT    Virtual Visit No    Medication changes reported     No    Fall or balance concerns reported    No    Tobacco Cessation No Change    Warm-up and Cool-down Performed on first and last piece of equipment    Resistance Training Performed Yes    VAD Patient? No    PAD/SET Patient? No      Pain Assessment   Currently in Pain? No/denies              Social History   Tobacco Use  Smoking Status Never Smoker  Smokeless Tobacco Never Used    Goals Met:  Independence with exercise equipment Exercise tolerated well No report of cardiac concerns or symptoms Strength training completed today  Goals Unmet:  Not Applicable  Comments: Pt able to follow exercise prescription today without complaint.  Will continue to monitor for progression.    Dr. Emily Filbert is Medical Director for [place] and [facility].

## 2020-12-24 ENCOUNTER — Other Ambulatory Visit: Payer: Self-pay

## 2020-12-24 ENCOUNTER — Encounter: Payer: BC Managed Care – PPO | Attending: Cardiology

## 2020-12-24 DIAGNOSIS — I5032 Chronic diastolic (congestive) heart failure: Secondary | ICD-10-CM | POA: Insufficient documentation

## 2020-12-24 NOTE — Progress Notes (Signed)
Daily Session Note  Patient Details  Name: FAIZAN GERACI MRN: [id number] Date of Birth: 06/23/1961 Referring Provider:   Flowsheet Row Cardiac Rehab from 10/24/2020 in [facility] Cardiac and Pulmonary Rehab  Referring Provider Isaias Cowman MD      Encounter Date: 12/24/2020  Check In:  Session Check In - 12/24/20 1002      Check-In   Supervising physician immediately available to respond to emergencies See telemetry face sheet for immediately available ER MD    Location [facility]-Cardiac & Pulmonary Rehab    Staff Present Birdie Sons, MPA, Elveria Rising, BA, ACSM CEP, Exercise Physiologist;Melissa Caiola RDN, Tawanna Solo, MS Exercise Physiologist    Virtual Visit No    Medication changes reported     No    Fall or balance concerns reported    No    Tobacco Cessation No Change    Warm-up and Cool-down Performed on first and last piece of equipment    Resistance Training Performed Yes    VAD Patient? No    PAD/SET Patient? No      Pain Assessment   Currently in Pain? No/denies              Social History   Tobacco Use  Smoking Status Never Smoker  Smokeless Tobacco Never Used    Goals Met:  Independence with exercise equipment Exercise tolerated well No report of cardiac concerns or symptoms Strength training completed today  Goals Unmet:  Not Applicable  Comments: Pt able to follow exercise prescription today without complaint.  Will continue to monitor for progression.    Dr. Emily Filbert is Medical Director for [place] and [facility].

## 2020-12-26 ENCOUNTER — Other Ambulatory Visit: Payer: Self-pay

## 2020-12-26 DIAGNOSIS — I5032 Chronic diastolic (congestive) heart failure: Secondary | ICD-10-CM

## 2020-12-26 NOTE — Progress Notes (Signed)
Daily Session Note  Patient Details  Name: Philip Cook MRN: [id number] Date of Birth: 10-Sep-1961 Referring Provider:   Flowsheet Row Cardiac Rehab from 10/24/2020 in [facility] Cardiac and Pulmonary Rehab  Referring Provider Isaias Cowman MD      Encounter Date: 12/26/2020  Check In:  Session Check In - 12/26/20 0944      Check-In   Supervising physician immediately available to respond to emergencies See telemetry face sheet for immediately available ER MD    Location [facility]-Cardiac & Pulmonary Rehab    Staff Present Birdie Sons, MPA, RN;Melissa Caiola RDN, Rowe Pavy, BA, ACSM CEP, Exercise Physiologist    Virtual Visit No    Medication changes reported     No    Fall or balance concerns reported    No    Tobacco Cessation No Change    Warm-up and Cool-down Performed on first and last piece of equipment    Resistance Training Performed Yes    VAD Patient? No    PAD/SET Patient? No      Pain Assessment   Currently in Pain? No/denies              Social History   Tobacco Use  Smoking Status Never Smoker  Smokeless Tobacco Never Used    Goals Met:  Independence with exercise equipment Exercise tolerated well No report of cardiac concerns or symptoms Strength training completed today  Goals Unmet:  Not Applicable  Comments: Pt able to follow exercise prescription today without complaint.  Will continue to monitor for progression.    Dr. Emily Filbert is Medical Director for [place] and [facility].

## 2020-12-31 ENCOUNTER — Other Ambulatory Visit: Payer: Self-pay

## 2020-12-31 DIAGNOSIS — I5032 Chronic diastolic (congestive) heart failure: Secondary | ICD-10-CM | POA: Diagnosis not present

## 2020-12-31 NOTE — Progress Notes (Signed)
Daily Session Note  Patient Details  Name: Philip Cook MRN: [id number] Date of Birth: Jun 28, 1961 Referring Provider:   Flowsheet Row Cardiac Rehab from 10/24/2020 in [facility] Cardiac and Pulmonary Rehab  Referring Provider Isaias Cowman MD      Encounter Date: 12/31/2020  Check In:  Session Check In - 12/31/20 0943      Check-In   Supervising physician immediately available to respond to emergencies See telemetry face sheet for immediately available ER MD    Location [facility]-Cardiac & Pulmonary Rehab    Staff Present Birdie Sons, MPA, Elveria Rising, BA, ACSM CEP, Exercise Physiologist;Kara Eliezer Bottom, MS Exercise Physiologist    Virtual Visit No    Medication changes reported     No    Fall or balance concerns reported    No    Tobacco Cessation No Change    Warm-up and Cool-down Performed on first and last piece of equipment    Resistance Training Performed Yes    VAD Patient? No    PAD/SET Patient? No      Pain Assessment   Currently in Pain? No/denies              Social History   Tobacco Use  Smoking Status Never Smoker  Smokeless Tobacco Never Used    Goals Met:  Independence with exercise equipment Exercise tolerated well Personal goals reviewed No report of cardiac concerns or symptoms Strength training completed today  Goals Unmet:  Not Applicable  Comments: Pt able to follow exercise prescription today without complaint.  Will continue to monitor for progression.    Dr. Emily Filbert is Medical Director for [place] and [facility].

## 2021-01-07 ENCOUNTER — Encounter: Payer: BC Managed Care – PPO | Admitting: *Deleted

## 2021-01-07 ENCOUNTER — Other Ambulatory Visit: Payer: Self-pay

## 2021-01-07 DIAGNOSIS — I5032 Chronic diastolic (congestive) heart failure: Secondary | ICD-10-CM | POA: Diagnosis not present

## 2021-01-07 NOTE — Progress Notes (Signed)
Daily Session Note  Patient Details  Name: Philip Cook MRN: [id number] Date of Birth: 1960-11-04 Referring Provider:   Flowsheet Row Cardiac Rehab from 10/24/2020 in [facility]  Referring Provider Isaias Cowman MD      Encounter Date: 01/07/2021  Check In:  Session Check In - 01/07/21 1040      Check-In   Supervising physician immediately available to respond to emergencies See telemetry face sheet for immediately available ER MD    Location [facility]-Cardiac [facility] Pul[facility]    Staff Present Heath Lark, RN, BSN, CCRP;Laureen Owens Shark, BS, RRT, CPFT;Melissa Caiola RDN, LDN    Virtual Visit No    Medication changes reported     No    Fall or balance concerns reported    No    Warm-up and Cool-down Performed on first and last piece of equipment    Resistance Training Performed Yes    VAD Patient? No    PAD/SET Patient? No      Pain Assessment   Currently in Pain? No/denies              Social History   Tobacco Use  Smoking Status Never Smoker  Smokeless Tobacco Never Used    Goals Met:  Independence with exercise equipment Exercise tolerated well No report of cardiac concerns or symptoms  Goals Unmet:  Not Applicable  Comments: Pt able to follow exercise prescription today without complaint.  Will continue to monitor for progression.    Dr. Emily Filbert is Medical Director for [facility].

## 2021-01-08 ENCOUNTER — Encounter: Payer: Self-pay | Admitting: *Deleted

## 2021-01-08 DIAGNOSIS — I5032 Chronic diastolic (congestive) heart failure: Secondary | ICD-10-CM

## 2021-01-08 NOTE — Progress Notes (Signed)
Cardiac Individual Treatment Plan  Patient Details  Name: Philip Cook MRN: [id number] Date of Birth: 22-Nov-1960 Referring Provider:   Flowsheet Row Cardiac Rehab from 10/24/2020 in [facility] Cardiac and Pulmonary Rehab  Referring Provider Isaias Cowman MD      Initial Encounter Date:  Flowsheet Row Cardiac Rehab from 10/24/2020 in [facility] Cardiac and Pulmonary Rehab  Date 10/24/20      Visit Diagnosis: Chronic diastolic heart failure ([place])  Patient's Home Medications on Admission:  Current Outpatient Medications:  .  apixaban (ELIQUIS) 5 MG TABS tablet, Take 1 tablet (5 mg total) by mouth 2 (two) times daily., Disp: 60 tablet, Rfl: 0 .  diltiazem (CARDIZEM CD) 180 MG 24 hr capsule, Take 1 capsule (180 mg total) by mouth daily., Disp: 30 capsule, Rfl: 0 .  furosemide (LASIX) 20 MG tablet, Take 1 tablet (20 mg total) by mouth daily., Disp: 30 tablet, Rfl: 0 .  HUMULIN R 500 UNIT/ML injection, Inject into the skin. Use in insulin pump, Disp: , Rfl:  .  Insulin Human (INSULIN PUMP) SOLN, Inject into the skin. , Disp: , Rfl:  .  Multiple Vitamin (MULTIVITAMIN WITH MINERALS) TABS tablet, Take 1 tablet by mouth daily., Disp: , Rfl:  .  oxybutynin (DITROPAN XL) 10 MG 24 hr tablet, Take 1 tablet (10 mg total) by mouth daily as needed (bladder spasm/stent pain). (Patient not taking: No sig reported), Disp: 30 tablet, Rfl: 0 .  prasugrel (EFFIENT) 10 MG TABS tablet, Take 10 mg by mouth daily., Disp: , Rfl:  .  rosuvastatin (CRESTOR) 10 MG tablet, Take 1 tablet (10 mg total) by mouth daily., Disp: 30 tablet, Rfl: 0 .  tamsulosin (FLOMAX) 0.4 MG CAPS capsule, Take 1 capsule (0.4 mg total) by mouth daily after supper. (Patient not taking: No sig reported), Disp: 14 capsule, Rfl: 0  Past Medical History: Past Medical History:  Diagnosis Date  . CHF (congestive heart failure) ([place])   . Coronary artery disease   . Diabetes mellitus without complication ([place])   . History of kidney stones   . Hx  MRSA infection   . Hyperlipidemia   . Hypertension   . Kidney stone   . Sleep apnea     Tobacco Use: Social History   Tobacco Use  Smoking Status Never Smoker  Smokeless Tobacco Never Used    Labs: Recent Review Flowsheet Data    Labs for ITP Cardiac and Pulmonary Rehab Latest Ref Rng & Units 08/30/2020 11/23/2020   Hemoglobin A1c 4.8 - 5.6 % 8.3(H) 8.2(H)       Exercise Target Goals: Exercise Program Goal: Individual exercise prescription set using results from initial 6 min walk test and THRR while considering  patient's activity barriers and safety.   Exercise Prescription Goal: Initial exercise prescription builds to 30-45 minutes a day of aerobic activity, 2-3 days per week.  Home exercise guidelines will be given to patient during program as part of exercise prescription that the participant will acknowledge.   Education: Aerobic Exercise: - Group verbal and visual presentation on the components of exercise prescription. Introduces F.I.T.T principle from ACSM for exercise prescriptions.  Reviews F.I.T.T. principles of aerobic exercise including progression. Written material given at graduation. Flowsheet Row Cardiac Rehab from 12/05/2020 in [facility] Cardiac and Pulmonary Rehab  Date 11/07/20  Educator jh  Instruction Review Code 1- Verbalizes Understanding      Education: Resistance Exercise: - Group verbal and visual presentation on the components of exercise prescription. Introduces F.I.T.T principle from ACSM  for exercise prescriptions  Reviews F.I.T.T. principles of resistance exercise including progression. Written material given at graduation.    Education: Exercise & Equipment Safety: - Individual verbal instruction and demonstration of equipment use and safety with use of the equipment. Flowsheet Row Cardiac Rehab from 12/05/2020 in [facility] Cardiac and Pulmonary Rehab  Education need identified 10/24/20  Date 10/24/20  Educator [place]  Instruction Review Code 1-  Verbalizes Understanding      Education: Exercise Physiology & General Exercise Guidelines: - Group verbal and written instruction with models to review the exercise physiology of the cardiovascular system and associated critical values. Provides general exercise guidelines with specific guidelines to those with heart or lung disease.  Flowsheet Row Cardiac Rehab from 12/05/2020 in [facility] Cardiac and Pulmonary Rehab  Date 10/31/20  Educator [place]  Instruction Review Code 1- Verbalizes Understanding      Education: Flexibility, Balance, Mind/Body Relaxation: - Group verbal and visual presentation with interactive activity on the components of exercise prescription. Introduces F.I.T.T principle from ACSM for exercise prescriptions. Reviews F.I.T.T. principles of flexibility and balance exercise training including progression. Also discusses the mind body connection.  Reviews various relaxation techniques to help reduce and manage stress (i.e. Deep breathing, progressive muscle relaxation, and visualization). Balance handout provided to take home. Written material given at graduation. Flowsheet Row Cardiac Rehab from 09/15/2017 in [facility] Cardiac and Pulmonary Rehab  Date 06/02/17  Educator AS  Instruction Review Code 1- Verbalizes Understanding      Activity Barriers & Risk Stratification:  Activity Barriers & Cardiac Risk Stratification - 10/24/20 1351      Activity Barriers & Cardiac Risk Stratification   Activity Barriers Arthritis;Deconditioning    Cardiac Risk Stratification High           6 Minute Walk:  6 Minute Walk    Row Name 10/24/20 1346         6 Minute Walk   Phase Initial     Distance 1250 feet     Walk Time 6 minutes     # of Rest Breaks 0     MPH 2.36     METS 2.33     RPE 13     Perceived Dyspnea  1     VO2 Peak 8.15     Symptoms Yes (comment)     Comments Hip pain 3/10     Resting HR 69 bpm     Resting BP 118/62     Resting Oxygen Saturation  97 %      Exercise Oxygen Saturation  during 6 min walk 98 %     Max Ex. HR 97 bpm     Max Ex. BP 154/64     2 Minute Post BP 128/66            Oxygen Initial Assessment:   Oxygen Re-Evaluation:   Oxygen Discharge (Final Oxygen Re-Evaluation):   Initial Exercise Prescription:  Initial Exercise Prescription - 10/24/20 1300      Date of Initial Exercise RX and Referring Provider   Date 10/24/20    Referring Provider Isaias Cowman MD      Treadmill   MPH 1.7    Grade 0    Minutes 15    METs 2.3      Recumbant Bike   Level 2    RPM 60    Watts 15    Minutes 15    METs 2.3      NuStep   Level 2  SPM 80    Minutes 15    METs 2.3      T5 Nustep   Level 1    SPM 80    Minutes 15    METs 2.3      Biostep-RELP   Level 1    SPM 50    Minutes 15    METs 2.3      Prescription Details   Frequency (times per week) 2    Duration Progress to 30 minutes of continuous aerobic without signs/symptoms of physical distress      Intensity   THRR 40-80% of Max Heartrate 106-144    Ratings of Perceived Exertion 11-13    Perceived Dyspnea 0-4      Progression   Progression Continue to progress workloads to maintain intensity without signs/symptoms of physical distress.      Resistance Training   Training Prescription Yes    Weight 3 lb    Reps 10-15           Perform Capillary Blood Glucose checks as needed.  Exercise Prescription Changes:  Exercise Prescription Changes    Row Name 10/24/20 1300 11/04/20 1600 11/14/20 1000 11/19/20 1500 12/03/20 1300     Response to Exercise   Blood Pressure (Admit) 118/62 142/70 -- 132/76 152/54   Blood Pressure (Exercise) 154/64 178/88 -- 154/78 146/68   Blood Pressure (Exit) 128/66 124/68 -- 122/74 138/64   Heart Rate (Admit) 69 bpm 88 bpm -- 73 bpm 82 bpm   Heart Rate (Exercise) 97 bpm 102 bpm -- 105 bpm 101 bpm   Heart Rate (Exit) 68 bpm 82 bpm -- 80 bpm 72 bpm   Oxygen Saturation (Admit) 97 % -- -- -- --   Oxygen  Saturation (Exercise) 98 % -- -- -- --   Oxygen Saturation (Exit) 96 % -- -- -- --   Rating of Perceived Exertion (Exercise) 13 12 -- 13 14   Perceived Dyspnea (Exercise) 1 -- -- -- --   Symptoms hip pain 3/10 -- -- none none   Comments walk test results second day exercise -- -- --   Duration -- Continue with 30 min of aerobic exercise without signs/symptoms of physical distress. -- Continue with 30 min of aerobic exercise without signs/symptoms of physical distress. Continue with 30 min of aerobic exercise without signs/symptoms of physical distress.   Intensity -- THRR unchanged -- THRR unchanged THRR unchanged     Progression   Progression -- Continue to progress workloads to maintain intensity without signs/symptoms of physical distress. -- Continue to progress workloads to maintain intensity without signs/symptoms of physical distress. Continue to progress workloads to maintain intensity without signs/symptoms of physical distress.   Average METs -- 2.48 -- 2.34 2.4     Resistance Training   Training Prescription -- Yes -- Yes Yes   Weight -- 3 lb -- 3 lb 3 lb   Reps -- 10-15 -- 10-15 10-15     Interval Training   Interval Training -- -- -- No No     Treadmill   MPH -- 2 -- 2.2 1.9   Grade -- 0 -- 0 --   Minutes -- 15 -- 15 15   METs -- 2.76 -- 2.68 2.45     Recumbant Bike   Level -- -- -- 2 --   Minutes -- -- -- 15 --   METs -- -- -- 2.6 --     NuStep   Level -- -- -- 2 --  Minutes -- -- -- 15 --   METs -- -- -- 2.2 --     T5 Nustep   Level -- 3 -- 3 3   SPM -- 80 -- -- --   Minutes -- 15 -- 15 15   METs -- 2.2 -- 1.9 2.1     Home Exercise Plan   Plans to continue exercise at -- -- Home (comment)  walk, staff videos, considering [place] (comment)  walk, staff videos, considering [place] (comment)  walk, staff videos, considering [employer]   Frequency -- -- Add 3 additional days to program exercise sessions. Add 3 additional days to  program exercise sessions. Add 3 additional days to program exercise sessions.   Initial Home Exercises Provided -- -- 11/14/20 11/14/20 11/14/20   Row Name 12/17/20 1500 12/30/20 1100           Response to Exercise   Blood Pressure (Admit) 134/64 --      Blood Pressure (Exercise) 144/86 152/78      Blood Pressure (Exit) 134/80 132/70      Heart Rate (Admit) 74 bpm 69 bpm      Heart Rate (Exercise) 101 bpm 89 bpm      Heart Rate (Exit) 80 bpm 66 bpm      Rating of Perceived Exertion (Exercise) 14 12      Symptoms none none      Duration Continue with 30 min of aerobic exercise without signs/symptoms of physical distress. Continue with 30 min of aerobic exercise without signs/symptoms of physical distress.      Intensity THRR unchanged THRR unchanged             Progression   Progression Continue to progress workloads to maintain intensity without signs/symptoms of physical distress. Continue to progress workloads to maintain intensity without signs/symptoms of physical distress.      Average METs 2.64 2.26             Resistance Training   Training Prescription Yes Yes      Weight 4 lb 4 lb      Reps 10-15 10-15             Interval Training   Interval Training No No             Treadmill   MPH 2 1.7      Grade -- 0      Minutes 15 15      METs 2.53 2.3             NuStep   Level 3 --      Minutes 15 --      METs 2.6 --             T5 Nustep   Level 5 4      Minutes 15 15      METs 2.8 2.1             Home Exercise Plan   Plans to continue exercise at Home (comment)  walk, staff videos, considering [place] (comment)  walk, staff videos, considering [employer]      Frequency Add 3 additional days to program exercise sessions. Add 3 additional days to program exercise sessions.      Initial Home Exercises Provided 11/14/20 11/14/20             Exercise Comments:  Exercise Comments    Row Name 10/29/20 1017  Exercise Comments  First full day of exercise!  Patient was oriented to gym and equipment including functions, settings, policies, and procedures.  Patient's individual exercise prescription and treatment plan were reviewed.  All starting workloads were established based on the results of the 6 minute walk test done at initial orientation visit.  The plan for exercise progression was also introduced and progression will be customized based on patient's performance and goals.              Exercise Goals and Review:  Exercise Goals    Row Name 10/24/20 1351             Exercise Goals   Increase Physical Activity Yes       Intervention Provide advice, education, support and counseling about physical activity/exercise needs.;Develop an individualized exercise prescription for aerobic and resistive training based on initial evaluation findings, risk stratification, comorbidities and participant's personal goals.       Expected Outcomes Short Term: Attend rehab on a regular basis to increase amount of physical activity.;Long Term: Add in home exercise to make exercise part of routine and to increase amount of physical activity.;Long Term: Exercising regularly at least 3-5 days a week.       Increase Strength and Stamina Yes       Intervention Provide advice, education, support and counseling about physical activity/exercise needs.;Develop an individualized exercise prescription for aerobic and resistive training based on initial evaluation findings, risk stratification, comorbidities and participant's personal goals.       Expected Outcomes Short Term: Increase workloads from initial exercise prescription for resistance, speed, and METs.;Short Term: Perform resistance training exercises routinely during rehab and add in resistance training at home;Long Term: Improve cardiorespiratory fitness, muscular endurance and strength as measured by increased METs and functional capacity (6MWT)       Able to understand and use  rate of perceived exertion (RPE) scale Yes       Intervention Provide education and explanation on how to use RPE scale       Expected Outcomes Short Term: Able to use RPE daily in rehab to express subjective intensity level;Long Term:  Able to use RPE to guide intensity level when exercising independently       Able to understand and use Dyspnea scale Yes       Intervention Provide education and explanation on how to use Dyspnea scale       Expected Outcomes Short Term: Able to use Dyspnea scale daily in rehab to express subjective sense of shortness of breath during exertion;Long Term: Able to use Dyspnea scale to guide intensity level when exercising independently       Knowledge and understanding of Target Heart Rate Range (THRR) Yes       Intervention Provide education and explanation of THRR including how the numbers were predicted and where they are located for reference       Expected Outcomes Short Term: Able to state/look up THRR;Short Term: Able to use daily as guideline for intensity in rehab;Long Term: Able to use THRR to govern intensity when exercising independently       Able to check pulse independently Yes       Intervention Provide education and demonstration on how to check pulse in carotid and radial arteries.;Review the importance of being able to check your own pulse for safety during independent exercise       Expected Outcomes Short Term: Able to explain why pulse checking is important during independent exercise;Long  Term: Able to check pulse independently and accurately       Understanding of Exercise Prescription Yes       Intervention Provide education, explanation, and written materials on patient's individual exercise prescription       Expected Outcomes Short Term: Able to explain program exercise prescription;Long Term: Able to explain home exercise prescription to exercise independently              Exercise Goals Re-Evaluation :  Exercise Goals Re-Evaluation     Row Name 10/29/20 1017 11/04/20 1616 11/14/20 1036 11/19/20 1525 12/03/20 1009     Exercise Goal Re-Evaluation   Exercise Goals Review Able to understand and use rate of perceived exertion (RPE) scale;Able to understand and use Dyspnea scale;Knowledge and understanding of Target Heart Rate Range (THRR);Understanding of Exercise Prescription Increase Physical Activity;Increase Strength and Stamina Increase Physical Activity;Increase Strength and Stamina;Able to understand and use rate of perceived exertion (RPE) scale;Able to understand and use Dyspnea scale;Knowledge and understanding of Target Heart Rate Range (THRR);Able to check pulse independently;Understanding of Exercise Prescription Increase Physical Activity;Increase Strength and Stamina;Understanding of Exercise Prescription Increase Physical Activity;Increase Strength and Stamina   Comments Reviewed RPE and dyspnea scales, THR and program prescription with pt today.  Pt voiced understanding and was given a copy of goals to take home. Philip Cook has tolerated exercise well in his first sessions.  He has increased TM to 2 mph. We will continue to monitor progress. Reviewed home exercise with pt today.  Pt plans to walk and use staff videos for exercise.  He and his wife are also planning to join [employer].  Reviewed THR, pulse, RPE, sign and symptoms, pulse oximetery and when to call 911 or MD.  Also discussed weather considerations and indoor options.  Pt voiced understanding. Philip Cook is doing well in rehab.  He is now up to 2.2 mph on the treadmill.  We will continue to monitor his progress. Philip Cook is back today after getting clearance due to new onset afib. He started back at 1.9 mph today. We will monitor progress.   Expected Outcomes Short: Use RPE daily to regulate intensity. Long: Follow program prescription in THR. Short:  attend consistently Long:  improve overall MET level Short: Start to add in more exercise at home Long; Continue to exercise  indpendently. Short: Increase level on NuStep Long: Continue to improve stamina. Short:  get back to consistent attendance Long:  continue to build stamina   Row Name 12/17/20 1530 12/30/20 1130           Exercise Goal Re-Evaluation   Exercise Goals Review Increase Physical Activity;Increase Strength and Stamina;Understanding of Exercise Prescription Increase Physical Activity;Increase Strength and Stamina      Comments Philip Cook is doing well in rehab.  He now up to level 5 on the NuStep.  We will continue to monitor his progress. Philip Cook attends consistently amd works a RPE 12.  He does not reach target HR range. Staff will review HR.      Expected Outcomes Short: Continue to bump up workloads Long: Continue to improve stamina Short:attempt reaching THR range Long: increase MET level             Discharge Exercise Prescription (Final Exercise Prescription Changes):  Exercise Prescription Changes - 12/30/20 1100      Response to Exercise   Blood Pressure (Exercise) 152/78    Blood Pressure (Exit) 132/70    Heart Rate (Admit) 69 bpm    Heart Rate (Exercise) 89  bpm    Heart Rate (Exit) 66 bpm    Rating of Perceived Exertion (Exercise) 12    Symptoms none    Duration Continue with 30 min of aerobic exercise without signs/symptoms of physical distress.    Intensity THRR unchanged      Progression   Progression Continue to progress workloads to maintain intensity without signs/symptoms of physical distress.    Average METs 2.26      Resistance Training   Training Prescription Yes    Weight 4 lb    Reps 10-15      Interval Training   Interval Training No      Treadmill   MPH 1.7    Grade 0    Minutes 15    METs 2.3      T5 Nustep   Level 4    Minutes 15    METs 2.1      Home Exercise Plan   Plans to continue exercise at Home (comment)   walk, staff videos, considering [employer]   Frequency Add 3 additional days to program exercise sessions.    Initial Home Exercises  Provided 11/14/20           Nutrition:  Target Goals: Understanding of nutrition guidelines, daily intake of sodium <1584m, cholesterol <2027m calories 30% from fat and 7% or less from saturated fats, daily to have 5 or more servings of fruits and vegetables.  Education: All About Nutrition: -Group instruction provided by verbal, written material, interactive activities, discussions, models, and posters to present general guidelines for heart healthy nutrition including fat, fiber, MyPlate, the role of sodium in heart healthy nutrition, utilization of the nutrition label, and utilization of this knowledge for meal planning. Follow up email sent as well. Written material given at graduation. [facility]  Date 09/06/17  Educator PI  Instruction Review Code 1- Verbalizes Understanding      Biometrics:  Pre Biometrics - 10/24/20 1351      Pre Biometrics   Height 5' 10"  (1.778 m)    Weight 343 lb 1.6 oz (155.6 kg)    BMI (Calculated) 49.23    Single Leg Stand 18.7 seconds            Nutrition Therapy Plan and Nutrition Goals:  Nutrition Therapy & Goals - 11/05/20 0848      Nutrition Therapy   Diet Heart healthy, low Na, diabetes friendly    Drug/Food Interactions Statins/Certain Fruits    Protein (specify units) 120g    Fiber 30 grams    Whole Grain Foods 3 servings    Saturated Fats 12 max. grams    Fruits and Vegetables 8 servings/day    Sodium 1.5 grams      Personal Nutrition Goals   Nutrition Goal ST: check in with feelings before, during, and after eating. create list of foods he enjoys. Try to drink more water instead of diet soda. LT: feel in control around food, prepare more meals and have snacks on hand.    Comments He is on insulin. He feels he is very picky - he doesn't like fish. he doesn't care for boiled or baked chicken - texture. He enjoys [place]. he also likes diet mountain dew, but  would like to drink more water even though he doesn't care for it. He has been trying to eat high protein. he counts his CHO. He only likes apples for fruit. He feels out of  control with food. He feels that he is out of control with food and feels that he wants to make changes, but in the moment is hard. Discussed heart healthy eating and diabetes friendly eating.  Discussed mindful eating and eating more intuitively - leftovers are ok and its ok if meals aren't perfect. Asked him to provide list of foods he enjoys and is willing to try to help make a plan for him. Asked him to observe how he feels before, during, and after eating food and where he sits on the hunger scale to help address feeling out of control around food. His wife is depressed and wants him to sit on the couch with her which causes him to midlessly eat - encouraged staying busy to avoid boredom and choosing lower calorie snacks when eating outside of hunger.      Intervention Plan   Intervention Prescribe, educate and counsel regarding individualized specific dietary modifications aiming towards targeted core components such as weight, hypertension, lipid management, diabetes, heart failure and other comorbidities.;Nutrition handout(s) given to patient.    Expected Outcomes Short Term Goal: Understand basic principles of dietary content, such as calories, fat, sodium, cholesterol and nutrients.;Short Term Goal: A plan has been developed with personal nutrition goals set during dietitian appointment.;Long Term Goal: Adherence to prescribed nutrition plan.           Nutrition Assessments:  MEDIFICTS Score Key:  ?70 Need to make dietary changes   40-70 Heart Healthy Diet  ? 40 Therapeutic Level Cholesterol Diet  Flowsheet Row Cardiac Rehab from 10/29/2020 in [facility] Cardiac and Pulmonary Rehab  Picture Your Plate Total Score on Admission 56     Picture Your Plate Scores:  <15 Unhealthy dietary pattern with much room for  improvement.  41-50 Dietary pattern unlikely to meet recommendations for good health and room for improvement.  51-60 More healthful dietary pattern, with some room for improvement.   >60 Healthy dietary pattern, although there may be some specific behaviors that could be improved.    Nutrition Goals Re-Evaluation:  Nutrition Goals Re-Evaluation    Hall Name 12/03/20 1014 12/31/20 0950           Goals   Current Weight 342 lb 9.6 oz (155.4 kg) --      Nutrition Goal His cardiologist recommends the DASH diet.  He is making a list of foods he is and isnt willing to try for the RD. Philip Cook has worked on his list for the RD - he will bring it in.  He has cut down on portion sizes and is buying less snacks to have at home.  He is also trying to drink more water.      Expected Outcome Short: meet with RD Long: use diet to help manage risk factors successfully SHort: bring list to RD Long: work on heart healthy DM freindly diet             Nutrition Goals Discharge (Final Nutrition Goals Re-Evaluation):  Nutrition Goals Re-Evaluation - 12/31/20 0950      Goals   Nutrition Goal Philip Cook has worked on his list for the RD - he will bring it in.  He has cut down on portion sizes and is buying less snacks to have at home.  He is also trying to drink more water.    Expected Outcome SHort: bring list to RD Long: work on heart healthy DM freindly diet           Psychosocial: Target Goals:  Acknowledge presence or absence of significant depression and/or stress, maximize coping skills, provide positive support system. Participant is able to verbalize types and ability to use techniques and skills needed for reducing stress and depression.   Education: Stress, Anxiety, and Depression - Group verbal and visual presentation to define topics covered.  Reviews how body is impacted by stress, anxiety, and depression.  Also discusses healthy ways to reduce stress and to treat/manage anxiety and depression.   Written material given at graduation. Flowsheet Row Cardiac Rehab from 09/15/2017 in [facility] Cardiac and [facility] Rehab  Date 07/07/17  Educator [facility]  Instruction Review Code 1- [employer] Understanding      Education: Sleep Hygiene -Provides group verbal and written instruction about how sleep can affect your health.  Define sleep hygiene, discuss sleep cycles and impact of sleep habits. Review good sleep hygiene tips.    Initial Review & Psychosocial Screening:  Initial Psych Review & Screening - 10/17/20 0916      Initial Review   Current issues with None Identified      Family Dynamics   Good Support System? Yes   Wife and children     Barriers   Psychosocial barriers to participate in program There are no identifiable barriers or psychosocial needs.      Screening Interventions   Interventions Encouraged to exercise    Expected Outcomes Short Term goal: Utilizing psychosocial counselor, staff and physician to assist with identification of specific Stressors or current issues interfering with healing process. Setting desired goal for each stressor or current issue identified.;Long Term Goal: Stressors or current issues are controlled or eliminated.;Short Term goal: Identification and review with participant of any Quality of Life or Depression concerns found by scoring the questionnaire.;Long Term goal: The participant improves quality of Life and PHQ9 Scores as seen by post scores and/or verbalization of changes           Quality of Life Scores:   Quality of Life - 10/24/20 0942      Quality of Life   Select Quality of Life      Quality of Life Scores   Health/Function Pre 14.9 %    Socioeconomic Pre 22.5 %    Psych/Spiritual Pre 23.43 %    Family Pre 21.6 %    GLOBAL Pre 19.21 %          Scores of 19 and below usually indicate a poorer quality of life in these areas.  A difference of  2-3 points is a clinically meaningful difference.  A difference of 2-3 points in  the total score of the Quality of Life Index has been associated with significant improvement in overall quality of life, self-image, physical symptoms, and general health in studies assessing change in quality of life.  PHQ-9: Recent Review Flowsheet Data    Depression screen [facility] 2/9 10/24/2020 12/28/2017 12/28/2017 09/28/2017 09/15/2017   Decreased Interest 1 0 0 0 1   Down, Depressed, Hopeless 0 0 0 0 0   PHQ - 2 Score 1 0 0 0 1   Altered sleeping 0 - - - 0   Tired, decreased energy 1 - - - 1   Change in appetite 2 - - - 1   Feeling bad or failure about yourself  0 - - - 1   Trouble concentrating 0 - - - 0   Moving slowly or fidgety/restless 0 - - - 0   Suicidal thoughts 0 - - - 0   PHQ-9 Score 4 - - -  4   Difficult doing work/chores Somewhat difficult - - - Not difficult at all     Interpretation of Total Score  Total Score Depression Severity:  1-4 = Minimal depression, 5-9 = Mild depression, 10-14 = Moderate depression, 15-19 = Moderately severe depression, 20-27 = Severe depression   Psychosocial Evaluation and Intervention:  Psychosocial Evaluation - 10/17/20 0937      Psychosocial Evaluation & Interventions   Comments Philip Cook has no barriers to attending the program. He has been through the program before and has had several life changes since last attendance. Philip Cook is retired and his wife is on disability for medical reason. He states that he does not have any more stress than the usual daily life stress. He lives with his wife and 2 teenage daughters, and male dog and cat. House full of women per Philip Cook.  He plans to look for a part time job in the future for something to do. He has not been able to lose weight and is talking about how to manage weight loss. He is looking forward to attending the program.    Expected Outcomes STG Attend all scheduled sessions, meet with RD to learn steps toward weight loss LTG: COntinue to manage weight with the tools and resources provided during program,  continue to work toward healty lifestyle and stress free life.    Continue Psychosocial Services  Follow up required by staff           Psychosocial Re-Evaluation:  Psychosocial Re-Evaluation    Row Name 12/03/20 1005 12/31/20 0952 12/31/20 0957         Psychosocial Re-Evaluation   Current issues with Current Stress Concerns;Current Sleep Concerns Current Stress Concerns;Current Sleep Concerns --     Comments Philip Cook says he sleeps like a rock with CPAP.  He and his daughter have both been to ER in the past week and that has caused a little stress.  He likes to do wood work to help with stress.  He also takes breaks from working to help reduce stress. Philip Cook stil has some stress ealted to his 54 year old daughter.  Some days stress is better.  He sits in the yard with his dogs to help with stress and is also working some which he says helps with stress. Philip Cook wife has NASH diease and that also causes some stress.  He does feel he can cope well most days.     Expected Outcomes Short: attend stress management education Long: manage stress long term Short: continue to use stress and playing with dogs to manage stress Long:  manage stress long term --            Psychosocial Discharge (Final Psychosocial Re-Evaluation):  Psychosocial Re-Evaluation - 12/31/20 0957      Psychosocial Re-Evaluation   Comments Philip Cook wife has NASH diease and that also causes some stress.  He does feel he can cope well most days.           Vocational Rehabilitation: Provide vocational rehab assistance to qualifying candidates.   Vocational Rehab Evaluation & Intervention:  Vocational Rehab - 10/17/20 0919      Initial Vocational Rehab Evaluation & Intervention   Assessment shows need for Vocational Rehabilitation No           Education: Education Goals: Education classes will be provided on a variety of topics geared toward better understanding of heart health and risk factor modification. Participant  will state understanding/return demonstration of topics presented as noted  by education test scores.  Learning Barriers/Preferences:  Learning Barriers/Preferences - 10/17/20 0919      Learning Barriers/Preferences   Learning Barriers None    Learning Preferences None           General Cardiac Education Topics:  AED/CPR: - Group verbal and written instruction with the use of models to demonstrate the basic use of the AED with the basic ABC's of resuscitation.   Anatomy and Cardiac Procedures: - Group verbal and visual presentation and models provide information about basic cardiac anatomy and function. Reviews the testing methods done to diagnose heart disease and the outcomes of the test results. Describes the treatment choices: Medical Management, Angioplasty, or Coronary Bypass Surgery for treating various heart conditions including Myocardial Infarction, Angina, Valve Disease, and Cardiac Arrhythmias.  Written material given at graduation. Flowsheet Row Cardiac Rehab from 09/15/2017 in [facility] Cardiac and Pulmonary Rehab  Date 06/07/17  Educator [facility]  Instruction Review Code 1- Verbalizes Understanding      Medication Safety: - Group verbal and visual instruction to review commonly prescribed medications for heart and lung disease. Reviews the medication, class of the drug, and side effects. Includes the steps to properly store meds and maintain the prescription regimen.  Written material given at graduation. Flowsheet Row Cardiac Rehab from 12/05/2020 in [facility] Cardiac and Pulmonary Rehab  Date 12/05/20  Educator [facility]  Instruction Review Code 1- Verbalizes Understanding      Intimacy: - Group verbal instruction through game format to discuss how heart and lung disease can affect sexual intimacy. Written material given at graduation.. Flowsheet Row Cardiac Rehab from 12/05/2020 in [facility] Cardiac and Pulmonary Rehab  Date 11/07/20  Educator [facility]  Instruction Review Code 1- Verbalizes  Understanding      Know Your Numbers and Heart Failure: - Group verbal and visual instruction to discuss disease risk factors for cardiac and pulmonary disease and treatment options.  Reviews associated critical values for Overweight/Obesity, Hypertension, Cholesterol, and Diabetes.  Discusses basics of heart failure: signs/symptoms and treatments.  Introduces Heart Failure Zone chart for action plan for heart failure.  Written material given at graduation. Flowsheet Row Cardiac Rehab from 09/15/2017 in [facility] Cardiac and Pulmonary Rehab  Date 06/07/17  Educator [facility]  Instruction Review Code 1- Verbalizes Understanding      Infection Prevention: - Provides verbal and written material to individual with discussion of infection control including proper hand washing and proper equipment cleaning during exercise session. Flowsheet Row Cardiac Rehab from 12/05/2020 in [facility] Cardiac and Pulmonary Rehab  Education need identified 10/24/20  Date 10/24/20  Educator [place]  Instruction Review Code 1- Verbalizes Understanding      Falls Prevention: - Provides verbal and written material to individual with discussion of falls prevention and safety. Flowsheet Row Cardiac Rehab from 12/05/2020 in [facility] Cardiac and Pulmonary Rehab  Education need identified 10/24/20  Date 10/24/20  Educator [place]  Instruction Review Code 1- Verbalizes Understanding      Other: -Provides group and verbal instruction on various topics (see comments)   Knowledge Questionnaire Score:  Knowledge Questionnaire Score - 10/24/20 0933      Knowledge Questionnaire Score   Pre Score 22/26: Angina, Nitro, Exercise           Core Components/Risk Factors/Patient Goals at Admission:  Personal Goals and Risk Factors at Admission - 10/24/20 1352      Core Components/Risk Factors/Patient Goals on Admission    Weight Management Yes;Obesity;Weight Loss    Intervention Weight Management: Develop a  combined nutrition and exercise  program designed to reach desired caloric intake, while maintaining appropriate intake of nutrient and fiber, sodium and fats, and appropriate energy expenditure required for the weight goal.;Weight Management: Provide education and appropriate resources to help participant work on and attain dietary goals.;Weight Management/Obesity: Establish reasonable short term and long term weight goals.;Obesity: Provide education and appropriate resources to help participant work on and attain dietary goals.    Admit Weight 343 lb (155.6 kg)    Goal Weight: Short Term 338 lb (153.3 kg)    Goal Weight: Long Term 323 lb (146.5 kg)    Expected Outcomes Short Term: Continue to assess and modify interventions until short term weight is achieved;Long Term: Adherence to nutrition and physical activity/exercise program aimed toward attainment of established weight goal;Weight Loss: Understanding of general recommendations for a balanced deficit meal plan, which promotes 1-2 lb weight loss per week and includes a negative energy balance of [phone] kcal/d    Diabetes Yes    Intervention Provide education about signs/symptoms and action to take for hypo/hyperglycemia.;Provide education about proper nutrition, including hydration, and aerobic/resistive exercise prescription along with prescribed medications to achieve blood glucose in normal ranges: Fasting glucose 65-99 mg/dL    Expected Outcomes Short Term: Participant verbalizes understanding of the signs/symptoms and immediate care of hyper/hypoglycemia, proper foot care and importance of medication, aerobic/resistive exercise and nutrition plan for blood glucose control.;Long Term: Attainment of HbA1C < 7%.    Heart Failure Yes    Intervention Provide a combined exercise and nutrition program that is supplemented with education, support and counseling about heart failure. Directed toward relieving symptoms such as shortness of breath, decreased exercise tolerance, and  extremity edema.    Expected Outcomes Improve functional capacity of life;Short term: Attendance in program 2-3 days a week with increased exercise capacity. Reported lower sodium intake. Reported increased fruit and vegetable intake. Reports medication compliance.;Short term: Daily weights obtained and reported for increase. Utilizing diuretic protocols set by physician.;Long term: Adoption of self-care skills and reduction of barriers for early signs and symptoms recognition and intervention leading to self-care maintenance.    Hypertension Yes    Intervention Provide education on lifestyle modifcations including regular physical activity/exercise, weight management, moderate sodium restriction and increased consumption of fresh fruit, vegetables, and low fat dairy, alcohol moderation, and smoking cessation.;Monitor prescription use compliance.    Expected Outcomes Short Term: Continued assessment and intervention until BP is < 140/88m HG in hypertensive participants. < 130/865mHG in hypertensive participants with diabetes, heart failure or chronic kidney disease.;Long Term: Maintenance of blood pressure at goal levels.    Lipids Yes    Intervention Provide education and support for participant on nutrition & aerobic/resistive exercise along with prescribed medications to achieve LDL <7048mHDL >60m23m  Expected Outcomes Short Term: Participant states understanding of desired cholesterol values and is compliant with medications prescribed. Participant is following exercise prescription and nutrition guidelines.;Long Term: Cholesterol controlled with medications as prescribed, with individualized exercise RX and with personalized nutrition plan. Value goals: LDL < 70mg74mL > 40 mg.           Education:Diabetes - Individual verbal and written instruction to review signs/symptoms of diabetes, desired ranges of glucose level fasting, after meals and with exercise. Acknowledge that pre and post  exercise glucose checks will be done for 3 sessions at entry of program. [place] [facility]  Education need identified 10/24/20  Date  10/24/20  Educator [place]  Instruction Review Code 1- Verbalizes Understanding      Core Components/Risk Factors/Patient Goals Review:   Goals and Risk Factor Review    Row Name 12/03/20 1000 12/31/20 0955 12/31/20 0958         Core Components/Risk Factors/Patient Goals Review   Personal Goals Review Weight Management/Obesity;Improve shortness of breath with ADL's;Heart Failure;Diabetes Weight Management/Obesity;Improve shortness of breath with ADL's;Heart Failure;Diabetes --     Review Philip Cook is now taking 50m eliquis and Cardizem due to new onset of a fib.  He saw cardiologist yesterday.  He does check BP at home - it has been elevated at 1528-413- systolic - it was 1244at Dr yesterday.  MAltamese Dillingsays his sugars have been good.  He started Flomax and he feels this has helped. MAltamese Dillingis taking meds as directed.  He doe smonitor BG and BP at home. MAltamese Dillingis taking meds as directed.  He does monitor BG and BP at home.  His weight was down today since he has been working.  He would like to travel some but doesn't have anyone to watch his dogs and home.     Expected Outcomes Short: continue to monitor BP/Bg at home  Long: manage risk factors long term -- Short:  continue to monitor vitals Long: manage risk factors long term            Core Components/Risk Factors/Patient Goals at Discharge (Final Review):   Goals and Risk Factor Review - 12/31/20 0958      Core Components/Risk Factors/Patient Goals Review   Review MAltamese Dillingis taking meds as directed.  He does monitor BG and BP at home.  His weight was down today since he has been working.  He would like to travel some but doesn't have anyone to watch his dogs and home.    Expected Outcomes Short:  continue to monitor vitals Long: manage risk factors long term            ITP Comments:  ITP Comments    Row Name 10/17/20 0933 10/24/20 0932 10/29/20 1017 11/13/20 0640 12/11/20 0750   ITP Comments Virtual orientation call completed today. he has an appointment on Date: 10/24/2020 for EP eval and gym Orientation.  [facility]. Completed 6MWT and gym orientation. Initial ITP created and sent for review to Dr. MEmily Filbert Medical Director. First full day of exercise!  Patient was oriented to gym and equipment including functions, settings, policies, and procedures.  Patient's individual exercise prescription and treatment plan were reviewed.  All starting workloads were established based on the results of the 6 minute walk test done at initial orientation visit.  The plan for exercise progression was also introduced and progression will be customized based on patient's performance and goals. 30 Day review completed. Medical Director ITP review done, changes made as directed, and signed approval by Medical Director. 30 Day review completed. Medical Director ITP review done, changes made as directed, and signed approval by Medical Director. only 3 visits in March   Row Name 01/08/21 1003           ITP Comments 30 Day review completed. Medical Director ITP review done, changes made as directed, and signed approval by Medical Director.              Comments:

## 2021-01-09 ENCOUNTER — Other Ambulatory Visit: Payer: Self-pay

## 2021-01-09 DIAGNOSIS — I5032 Chronic diastolic (congestive) heart failure: Secondary | ICD-10-CM

## 2021-01-09 NOTE — Progress Notes (Signed)
Daily Session Note  Patient Details  Name: Philip Cook MRN: [id number] Date of Birth: 22-Jul-1961 Referring Provider:   Flowsheet Row Cardiac Rehab from 10/24/2020 in [facility] Cardiac and Pulmonary Rehab  Referring Provider Isaias Cowman MD      Encounter Date: 01/09/2021  Check In:  Session Check In - 01/09/21 0944      Check-In   Supervising physician immediately available to respond to emergencies See telemetry face sheet for immediately available ER MD    Location [facility]-Cardiac & Pulmonary Rehab    Staff Present Birdie Sons, MPA, RN;Melissa Caiola RDN, Rowe Pavy, BA, ACSM CEP, Exercise Physiologist    Virtual Visit No    Medication changes reported     No    Fall or balance concerns reported    No    Tobacco Cessation No Change    Warm-up and Cool-down Performed on first and last piece of equipment    Resistance Training Performed Yes    VAD Patient? No    PAD/SET Patient? No      Pain Assessment   Currently in Pain? No/denies              Social History   Tobacco Use  Smoking Status Never Smoker  Smokeless Tobacco Never Used    Goals Met:  Independence with exercise equipment Exercise tolerated well No report of cardiac concerns or symptoms Strength training completed today  Goals Unmet:  Not Applicable  Comments: Pt able to follow exercise prescription today without complaint.  Will continue to monitor for progression.    Dr. Emily Filbert is Medical Director for [place] and [facility].

## 2021-01-21 ENCOUNTER — Encounter: Payer: BC Managed Care – PPO | Attending: Cardiology

## 2021-01-21 ENCOUNTER — Other Ambulatory Visit: Payer: Self-pay

## 2021-01-21 DIAGNOSIS — I5032 Chronic diastolic (congestive) heart failure: Secondary | ICD-10-CM | POA: Diagnosis not present

## 2021-01-21 NOTE — Progress Notes (Signed)
Daily Session Note  Patient Details  Name: Philip Cook MRN: [id number] Date of Birth: [birth year] Referring Provider:   Flowsheet Row Cardiac Rehab from 10/24/2020 in [facility] Cardiac and Pulmonary Rehab  Referring Provider Isaias Cowman MD      Encounter Date: 01/21/2021  Check In:  Session Check In - 01/21/21 0952      Check-In   Supervising physician immediately available to respond to emergencies See telemetry face sheet for immediately available ER MD    Location [facility]-Cardiac & Pulmonary Rehab    Staff Present Birdie Sons, MPA, Elveria Rising, BA, ACSM CEP, Exercise Physiologist;Kara Eliezer Bottom, MS Exercise Physiologist    Virtual Visit No    Medication changes reported     No    Fall or balance concerns reported    No    Tobacco Cessation No Change    Warm-up and Cool-down Performed on first and last piece of equipment    Resistance Training Performed Yes    VAD Patient? No    PAD/SET Patient? No      Pain Assessment   Currently in Pain? No/denies              Social History   Tobacco Use  Smoking Status Never Smoker  Smokeless Tobacco Never Used    Goals Met:  Independence with exercise equipment Exercise tolerated well No report of cardiac concerns or symptoms Strength training completed today  Goals Unmet:  Not Applicable  Comments: Pt able to follow exercise prescription today without complaint.  Will continue to monitor for progression.    Dr. Emily Filbert is Medical Director for [place] and [facility].

## 2021-01-23 ENCOUNTER — Other Ambulatory Visit: Payer: Self-pay

## 2021-01-23 DIAGNOSIS — I5032 Chronic diastolic (congestive) heart failure: Secondary | ICD-10-CM

## 2021-01-23 NOTE — Progress Notes (Signed)
Daily Session Note  Patient Details  Name: Philip Cook MRN: [id number] Date of Birth: [birth year] Referring Provider:   Flowsheet Row Cardiac Rehab from 10/24/2020 in [facility] Cardiac and Pulmonary Rehab  Referring Provider Isaias Cowman MD      Encounter Date: 01/23/2021  Check In:  Session Check In - 01/23/21 1010      Check-In   Supervising physician immediately available to respond to emergencies See telemetry face sheet for immediately available ER MD    Location [facility]-Cardiac & Pulmonary Rehab    Staff Present Birdie Sons, MPA, RN;Melissa Caiola RDN, LDN;Jessica Luan Pulling, MA, RCEP, CCRP, CCET    Virtual Visit No    Medication changes reported     No    Fall or balance concerns reported    No    Tobacco Cessation No Change    Warm-up and Cool-down Performed on first and last piece of equipment    Resistance Training Performed Yes    VAD Patient? No    PAD/SET Patient? No      Pain Assessment   Currently in Pain? No/denies              Social History   Tobacco Use  Smoking Status Never Smoker  Smokeless Tobacco Never Used    Goals Met:  Independence with exercise equipment Exercise tolerated well No report of cardiac concerns or symptoms Strength training completed today  Goals Unmet:  Not Applicable  Comments: Pt able to follow exercise prescription today without complaint.  Will continue to monitor for progression.    Dr. Emily Filbert is Medical Director for [place] and [facility].

## 2021-01-28 ENCOUNTER — Other Ambulatory Visit: Payer: Self-pay

## 2021-01-28 DIAGNOSIS — I5032 Chronic diastolic (congestive) heart failure: Secondary | ICD-10-CM | POA: Diagnosis not present

## 2021-01-28 NOTE — Progress Notes (Signed)
Daily Session Note  Patient Details  Name: Philip Cook MRN: [id number] Date of Birth: 1961/01/12 Referring Provider:   Flowsheet Row Cardiac Rehab from 10/24/2020 in [facility]  Referring Provider Isaias Cowman MD      Encounter Date: 01/28/2021  Check In:  Session Check In - 01/28/21 1003      Check-In   Supervising physician immediately available to respond to emergencies See telemetry face sheet for immediately available ER MD    Location [facility]-Cardiac & Pulmonary Rehab    Staff Present Birdie Sons, MPA, Elveria Rising, BA, ACSM CEP, Exercise Physiologist;Kara Eliezer Bottom, MS Exercise Physiologist    Virtual Visit No    Medication changes reported     No    Fall or balance concerns reported    No    Tobacco Cessation No Change    Warm-up and Cool-down Performed on first and last piece of equipment    Resistance Training Performed Yes    VAD Patient? No    PAD/SET Patient? No      Pain Assessment   Currently in Pain? No/denies              Social History   Tobacco Use  Smoking Status Never Smoker  Smokeless Tobacco Never Used    Goals Met:  Independence with exercise equipment Exercise tolerated well Personal goals reviewed No report of cardiac concerns or symptoms Strength training completed today  Goals Unmet:  Not Applicable  Comments: Pt able to follow exercise prescription today without complaint.  Will continue to monitor for progression.    Dr. Emily Filbert is Medical Director for [facility].

## 2021-01-30 ENCOUNTER — Other Ambulatory Visit: Payer: Self-pay

## 2021-01-30 DIAGNOSIS — I5032 Chronic diastolic (congestive) heart failure: Secondary | ICD-10-CM | POA: Diagnosis not present

## 2021-01-30 NOTE — Progress Notes (Signed)
Daily Session Note  Patient Details  Name: KEILEN KAHL MRN: [id number] Date of Birth: 1961-09-01 Referring Provider:   Flowsheet Row Cardiac Rehab from 10/24/2020 in [facility]  Referring Provider Isaias Cowman MD      Encounter Date: 01/30/2021  Check In:  Session Check In - 01/30/21 0946      Check-In   Supervising physician immediately available to respond to emergencies See telemetry face sheet for immediately available ER MD    Location [facility]-Cardiac & Pulmonary Rehab    Staff Present Birdie Sons, MPA, Elveria Rising, BA, ACSM CEP, Exercise Physiologist;Kara Eliezer Bottom, MS Exercise Physiologist    Virtual Visit No    Medication changes reported     No    Fall or balance concerns reported    No    Tobacco Cessation No Change    Warm-up and Cool-down Performed on first and last piece of equipment    Resistance Training Performed Yes    VAD Patient? No    PAD/SET Patient? No      Pain Assessment   Currently in Pain? No/denies              Social History   Tobacco Use  Smoking Status Never Smoker  Smokeless Tobacco Never Used    Goals Met:  Independence with exercise equipment Exercise tolerated well No report of cardiac concerns or symptoms Strength training completed today  Goals Unmet:  Not Applicable  Comments: Pt able to follow exercise prescription today without complaint.  Will continue to monitor for progression.    Dr. Emily Filbert is Medical Director for [place] and [facility].

## 2021-02-05 ENCOUNTER — Encounter: Payer: Self-pay | Admitting: *Deleted

## 2021-02-05 DIAGNOSIS — I5032 Chronic diastolic (congestive) heart failure: Secondary | ICD-10-CM

## 2021-02-05 NOTE — Progress Notes (Signed)
Cardiac Individual Treatment Plan  Patient Details  Name: Philip Cook MRN: [id number] Date of Birth: 06/29/1961 Referring Provider:   Flowsheet Row Cardiac Rehab from 10/24/2020 in [facility] Cardiac and Pulmonary Rehab  Referring Provider Isaias Cowman MD      Initial Encounter Date:  Flowsheet Row Cardiac Rehab from 10/24/2020 in [facility] Cardiac and Pulmonary Rehab  Date 10/24/20      Visit Diagnosis: Chronic diastolic heart failure ([place])  Patient's Home Medications on Admission:  Current Outpatient Medications:  .  apixaban (ELIQUIS) 5 MG TABS tablet, Take 1 tablet (5 mg total) by mouth 2 (two) times daily., Disp: 60 tablet, Rfl: 0 .  diltiazem (CARDIZEM CD) 180 MG 24 hr capsule, Take 1 capsule (180 mg total) by mouth daily., Disp: 30 capsule, Rfl: 0 .  furosemide (LASIX) 20 MG tablet, Take 1 tablet (20 mg total) by mouth daily., Disp: 30 tablet, Rfl: 0 .  HUMULIN R 500 UNIT/ML injection, Inject into the skin. Use in insulin pump, Disp: , Rfl:  .  Insulin Human (INSULIN PUMP) SOLN, Inject into the skin. , Disp: , Rfl:  .  Multiple Vitamin (MULTIVITAMIN WITH MINERALS) TABS tablet, Take 1 tablet by mouth daily., Disp: , Rfl:  .  oxybutynin (DITROPAN XL) 10 MG 24 hr tablet, Take 1 tablet (10 mg total) by mouth daily as needed (bladder spasm/stent pain). (Patient not taking: No sig reported), Disp: 30 tablet, Rfl: 0 .  prasugrel (EFFIENT) 10 MG TABS tablet, Take 10 mg by mouth daily., Disp: , Rfl:  .  rosuvastatin (CRESTOR) 10 MG tablet, Take 1 tablet (10 mg total) by mouth daily., Disp: 30 tablet, Rfl: 0 .  tamsulosin (FLOMAX) 0.4 MG CAPS capsule, Take 1 capsule (0.4 mg total) by mouth daily after supper. (Patient not taking: No sig reported), Disp: 14 capsule, Rfl: 0  Past Medical History: Past Medical History:  Diagnosis Date  . CHF (congestive heart failure) ([place])   . Coronary artery disease   . Diabetes mellitus without complication ([place])   . History of kidney stones   . Hx  MRSA infection   . Hyperlipidemia   . Hypertension   . Kidney stone   . Sleep apnea     Tobacco Use: Social History   Tobacco Use  Smoking Status Never Smoker  Smokeless Tobacco Never Used    Labs: Recent Review Flowsheet Data    Labs for ITP Cardiac and Pulmonary Rehab Latest Ref Rng & Units 08/30/2020 11/23/2020   Hemoglobin A1c 4.8 - 5.6 % 8.3(H) 8.2(H)       Exercise Target Goals: Exercise Program Goal: Individual exercise prescription set using results from initial 6 min walk test and THRR while considering  patient's activity barriers and safety.   Exercise Prescription Goal: Initial exercise prescription builds to 30-45 minutes a day of aerobic activity, 2-3 days per week.  Home exercise guidelines will be given to patient during program as part of exercise prescription that the participant will acknowledge.   Education: Aerobic Exercise: - Group verbal and visual presentation on the components of exercise prescription. Introduces F.I.T.T principle from ACSM for exercise prescriptions.  Reviews F.I.T.T. principles of aerobic exercise including progression. Written material given at graduation. Flowsheet Row Cardiac Rehab from 01/30/2021 in [facility] Cardiac and Pulmonary Rehab  Date 11/07/20  Educator jh  Instruction Review Code 1- Verbalizes Understanding      Education: Resistance Exercise: - Group verbal and visual presentation on the components of exercise prescription. Introduces F.I.T.T principle from ACSM  for exercise prescriptions  Reviews F.I.T.T. principles of resistance exercise including progression. Written material given at graduation.    Education: Exercise & Equipment Safety: - Individual verbal instruction and demonstration of equipment use and safety with use of the equipment. Flowsheet Row Cardiac Rehab from 01/30/2021 in [facility] Cardiac and Pulmonary Rehab  Education need identified 10/24/20  Date 10/24/20  Educator [place]  Instruction Review Code 1-  Verbalizes Understanding      Education: Exercise Physiology & General Exercise Guidelines: - Group verbal and written instruction with models to review the exercise physiology of the cardiovascular system and associated critical values. Provides general exercise guidelines with specific guidelines to those with heart or lung disease.  Flowsheet Row Cardiac Rehab from 01/30/2021 in [facility] Cardiac and Pulmonary Rehab  Date 10/31/20  Educator [facility]  Instruction Review Code 1- Verbalizes Understanding      Education: Flexibility, Balance, Mind/Body Relaxation: - Group verbal and visual presentation with interactive activity on the components of exercise prescription. Introduces F.I.T.T principle from ACSM for exercise prescriptions. Reviews F.I.T.T. principles of flexibility and balance exercise training including progression. Also discusses the mind body connection.  Reviews various relaxation techniques to help reduce and manage stress (i.e. Deep breathing, progressive muscle relaxation, and visualization). Balance handout provided to take home. Written material given at graduation. Flowsheet Row Cardiac Rehab from 01/30/2021 in [facility] Cardiac and Pulmonary Rehab  Date 01/23/21  Educator AS  Instruction Review Code 1- Verbalizes Understanding      Activity Barriers & Risk Stratification:  Activity Barriers & Cardiac Risk Stratification - 10/24/20 1351      Activity Barriers & Cardiac Risk Stratification   Activity Barriers Arthritis;Deconditioning    Cardiac Risk Stratification High           6 Minute Walk:  6 Minute Walk    Row Name 10/24/20 1346         6 Minute Walk   Phase Initial     Distance 1250 feet     Walk Time 6 minutes     # of Rest Breaks 0     MPH 2.36     METS 2.33     RPE 13     Perceived Dyspnea  1     VO2 Peak 8.15     Symptoms Yes (comment)     Comments Hip pain 3/10     Resting HR 69 bpm     Resting BP 118/62     Resting Oxygen Saturation  97 %      Exercise Oxygen Saturation  during 6 min walk 98 %     Max Ex. HR 97 bpm     Max Ex. BP 154/64     2 Minute Post BP 128/66            Oxygen Initial Assessment:   Oxygen Re-Evaluation:   Oxygen Discharge (Final Oxygen Re-Evaluation):   Initial Exercise Prescription:  Initial Exercise Prescription - 10/24/20 1300      Date of Initial Exercise RX and Referring Provider   Date 10/24/20    Referring Provider Isaias Cowman MD      Treadmill   MPH 1.7    Grade 0    Minutes 15    METs 2.3      Recumbant Bike   Level 2    RPM 60    Watts 15    Minutes 15    METs 2.3      NuStep   Level 2  SPM 80    Minutes 15    METs 2.3      T5 Nustep   Level 1    SPM 80    Minutes 15    METs 2.3      Biostep-RELP   Level 1    SPM 50    Minutes 15    METs 2.3      Prescription Details   Frequency (times per week) 2    Duration Progress to 30 minutes of continuous aerobic without signs/symptoms of physical distress      Intensity   THRR 40-80% of Max Heartrate 106-144    Ratings of Perceived Exertion 11-13    Perceived Dyspnea 0-4      Progression   Progression Continue to progress workloads to maintain intensity without signs/symptoms of physical distress.      Resistance Training   Training Prescription Yes    Weight 3 lb    Reps 10-15           Perform Capillary Blood Glucose checks as needed.  Exercise Prescription Changes:  Exercise Prescription Changes    Row Name 10/24/20 1300 11/04/20 1600 11/14/20 1000 11/19/20 1500 12/03/20 1300     Response to Exercise   Blood Pressure (Admit) 118/62 142/70 -- 132/76 152/54   Blood Pressure (Exercise) 154/64 178/88 -- 154/78 146/68   Blood Pressure (Exit) 128/66 124/68 -- 122/74 138/64   Heart Rate (Admit) 69 bpm 88 bpm -- 73 bpm 82 bpm   Heart Rate (Exercise) 97 bpm 102 bpm -- 105 bpm 101 bpm   Heart Rate (Exit) 68 bpm 82 bpm -- 80 bpm 72 bpm   Oxygen Saturation (Admit) 97 % -- -- -- --   Oxygen  Saturation (Exercise) 98 % -- -- -- --   Oxygen Saturation (Exit) 96 % -- -- -- --   Rating of Perceived Exertion (Exercise) 13 12 -- 13 14   Perceived Dyspnea (Exercise) 1 -- -- -- --   Symptoms hip pain 3/10 -- -- none none   Comments walk test results second day exercise -- -- --   Duration -- Continue with 30 min of aerobic exercise without signs/symptoms of physical distress. -- Continue with 30 min of aerobic exercise without signs/symptoms of physical distress. Continue with 30 min of aerobic exercise without signs/symptoms of physical distress.   Intensity -- THRR unchanged -- THRR unchanged THRR unchanged     Progression   Progression -- Continue to progress workloads to maintain intensity without signs/symptoms of physical distress. -- Continue to progress workloads to maintain intensity without signs/symptoms of physical distress. Continue to progress workloads to maintain intensity without signs/symptoms of physical distress.   Average METs -- 2.48 -- 2.34 2.4     Resistance Training   Training Prescription -- Yes -- Yes Yes   Weight -- 3 lb -- 3 lb 3 lb   Reps -- 10-15 -- 10-15 10-15     Interval Training   Interval Training -- -- -- No No     Treadmill   MPH -- 2 -- 2.2 1.9   Grade -- 0 -- 0 --   Minutes -- 15 -- 15 15   METs -- 2.76 -- 2.68 2.45     Recumbant Bike   Level -- -- -- 2 --   Minutes -- -- -- 15 --   METs -- -- -- 2.6 --     NuStep   Level -- -- -- 2 --  Minutes -- -- -- 15 --   METs -- -- -- 2.2 --     T5 Nustep   Level -- 3 -- 3 3   SPM -- 80 -- -- --   Minutes -- 15 -- 15 15   METs -- 2.2 -- 1.9 2.1     Home Exercise Plan   Plans to continue exercise at -- -- Home (comment)  walk, staff videos, considering [place] (comment)  walk, staff videos, considering [place] (comment)  walk, staff videos, considering [employer]   Frequency -- -- Add 3 additional days to program exercise sessions. Add 3 additional days to  program exercise sessions. Add 3 additional days to program exercise sessions.   Initial Home Exercises Provided -- -- 11/14/20 11/14/20 11/14/20   Row Name 12/17/20 1500 12/30/20 1100 01/15/21 1600 01/28/21 1400       Response to Exercise   Blood Pressure (Admit) 134/64 -- 126/62 130/62    Blood Pressure (Exercise) 144/86 152/78 132/76 126/68    Blood Pressure (Exit) 134/80 132/70 120/60 118/68    Heart Rate (Admit) 74 bpm 69 bpm 63 bpm 70 bpm    Heart Rate (Exercise) 101 bpm 89 bpm 88 bpm 79 bpm    Heart Rate (Exit) 80 bpm 66 bpm 72 bpm 68 bpm    Rating of Perceived Exertion (Exercise) 14 12 14 13     Symptoms none none none none    Duration Continue with 30 min of aerobic exercise without signs/symptoms of physical distress. Continue with 30 min of aerobic exercise without signs/symptoms of physical distress. Continue with 30 min of aerobic exercise without signs/symptoms of physical distress. Continue with 30 min of aerobic exercise without signs/symptoms of physical distress.    Intensity THRR unchanged THRR unchanged THRR unchanged THRR unchanged         Progression   Progression Continue to progress workloads to maintain intensity without signs/symptoms of physical distress. Continue to progress workloads to maintain intensity without signs/symptoms of physical distress. Continue to progress workloads to maintain intensity without signs/symptoms of physical distress. Continue to progress workloads to maintain intensity without signs/symptoms of physical distress.    Average METs 2.64 2.26 2.86 2.9         Resistance Training   Training Prescription Yes Yes Yes Yes    Weight 4 lb 4 lb 4 lb 3 lb    Reps 10-15 10-15 10-15 10-15         Interval Training   Interval Training No No No No         Treadmill   MPH 2 1.7 2 --    Grade -- 0 0.5 --    Minutes 15 15 30  --    METs 2.53 2.3 2.67 --         NuStep   Level 3 -- -- 4    Minutes 15 -- -- 15    METs 2.6 -- -- 2.9          T5 Nustep   Level 5 4 -- --    Minutes 15 15 -- --    METs 2.8 2.1 -- --         Biostep-RELP   Level -- -- 3 --    Minutes -- -- 15 --    METs -- -- 3 --         Home Exercise Plan   Plans to continue exercise at Home (comment)  walk, staff videos, considering  [place] (comment)  walk, staff videos, considering [place] (comment)  walk, staff videos, considering [place] (comment)  walk, staff videos, considering [employer]    Frequency Add 3 additional days to program exercise sessions. Add 3 additional days to program exercise sessions. Add 3 additional days to program exercise sessions. Add 3 additional days to program exercise sessions.    Initial Home Exercises Provided 11/14/20 11/14/20 11/14/20 11/14/20           Exercise Comments:  Exercise Comments    Row Name 10/29/20 1017           Exercise Comments First full day of exercise!  Patient was oriented to gym and equipment including functions, settings, policies, and procedures.  Patient's individual exercise prescription and treatment plan were reviewed.  All starting workloads were established based on the results of the 6 minute walk test done at initial orientation visit.  The plan for exercise progression was also introduced and progression will be customized based on patient's performance and goals.              Exercise Goals and Review:  Exercise Goals    Row Name 10/24/20 1351             Exercise Goals   Increase Physical Activity Yes       Intervention Provide advice, education, support and counseling about physical activity/exercise needs.;Develop an individualized exercise prescription for aerobic and resistive training based on initial evaluation findings, risk stratification, comorbidities and participant's personal goals.       Expected Outcomes Short Term: Attend rehab on a regular basis to increase amount of physical activity.;Long Term: Add in home  exercise to make exercise part of routine and to increase amount of physical activity.;Long Term: Exercising regularly at least 3-5 days a week.       Increase Strength and Stamina Yes       Intervention Provide advice, education, support and counseling about physical activity/exercise needs.;Develop an individualized exercise prescription for aerobic and resistive training based on initial evaluation findings, risk stratification, comorbidities and participant's personal goals.       Expected Outcomes Short Term: Increase workloads from initial exercise prescription for resistance, speed, and METs.;Short Term: Perform resistance training exercises routinely during rehab and add in resistance training at home;Long Term: Improve cardiorespiratory fitness, muscular endurance and strength as measured by increased METs and functional capacity (6MWT)       Able to understand and use rate of perceived exertion (RPE) scale Yes       Intervention Provide education and explanation on how to use RPE scale       Expected Outcomes Short Term: Able to use RPE daily in rehab to express subjective intensity level;Long Term:  Able to use RPE to guide intensity level when exercising independently       Able to understand and use Dyspnea scale Yes       Intervention Provide education and explanation on how to use Dyspnea scale       Expected Outcomes Short Term: Able to use Dyspnea scale daily in rehab to express subjective sense of shortness of breath during exertion;Long Term: Able to use Dyspnea scale to guide intensity level when exercising independently       Knowledge and understanding of Target Heart Rate Range (THRR) Yes       Intervention Provide education and explanation of THRR including how the numbers were predicted and where they are located for  reference       Expected Outcomes Short Term: Able to state/look up THRR;Short Term: Able to use daily as guideline for intensity in rehab;Long Term: Able to use  THRR to govern intensity when exercising independently       Able to check pulse independently Yes       Intervention Provide education and demonstration on how to check pulse in carotid and radial arteries.;Review the importance of being able to check your own pulse for safety during independent exercise       Expected Outcomes Short Term: Able to explain why pulse checking is important during independent exercise;Long Term: Able to check pulse independently and accurately       Understanding of Exercise Prescription Yes       Intervention Provide education, explanation, and written materials on patient's individual exercise prescription       Expected Outcomes Short Term: Able to explain program exercise prescription;Long Term: Able to explain home exercise prescription to exercise independently              Exercise Goals Re-Evaluation :  Exercise Goals Re-Evaluation    Row Name 10/29/20 1017 11/04/20 1616 11/14/20 1036 11/19/20 1525 12/03/20 1009     Exercise Goal Re-Evaluation   Exercise Goals Review Able to understand and use rate of perceived exertion (RPE) scale;Able to understand and use Dyspnea scale;Knowledge and understanding of Target Heart Rate Range (THRR);Understanding of Exercise Prescription Increase Physical Activity;Increase Strength and Stamina Increase Physical Activity;Increase Strength and Stamina;Able to understand and use rate of perceived exertion (RPE) scale;Able to understand and use Dyspnea scale;Knowledge and understanding of Target Heart Rate Range (THRR);Able to check pulse independently;Understanding of Exercise Prescription Increase Physical Activity;Increase Strength and Stamina;Understanding of Exercise Prescription Increase Physical Activity;Increase Strength and Stamina   Comments Reviewed RPE and dyspnea scales, THR and program prescription with pt today.  Pt voiced understanding and was given a copy of goals to take home. [name] has tolerated exercise well in  his first sessions.  He has increased TM to 2 mph. We will continue to monitor progress. Reviewed home exercise with pt today.  Pt plans to walk and use staff videos for exercise.  He and his wife are also planning to join [employer].  Reviewed THR, pulse, RPE, sign and symptoms, pulse oximetery and when to call 911 or MD.  Also discussed weather considerations and indoor options.  Pt voiced understanding. [name] is doing well in rehab.  He is now up to 2.2 mph on the treadmill.  We will continue to monitor his progress. [name] is back today after getting clearance due to new onset afib. He started back at 1.9 mph today. We will monitor progress.   Expected Outcomes Short: Use RPE daily to regulate intensity. Long: Follow program prescription in THR. Short:  attend consistently Long:  improve overall MET level Short: Start to add in more exercise at home Long; Continue to exercise indpendently. Short: Increase level on NuStep Long: Continue to improve stamina. Short:  get back to consistent attendance Long:  continue to build stamina   Row Name 12/17/20 1530 12/30/20 1130 01/15/21 1606 01/28/21 1000       Exercise Goal Re-Evaluation   Exercise Goals Review Increase Physical Activity;Increase Strength and Stamina;Understanding of Exercise Prescription Increase Physical Activity;Increase Strength and Stamina Increase Physical Activity;Increase Strength and Stamina;Understanding of Exercise Prescription Increase Physical Activity;Increase Strength and Stamina;Understanding of Exercise Prescription    Comments [name] is doing well in rehab.  He now  up to level 5 on the NuStep.  We will continue to monitor his progress. [name] attends consistently amd works a RPE 12.  He does not reach target HR range. Staff will review HR. [name] is doing well in rehab.  He missed yesterday due to a doctor's appointment.  He has dropped down from his 1% grade to 0.5% so we will work with him to get back up.  We will continue to montior  his progress. [name] reports not doing much exercise at home. Pt plans to join the gym with his wife - his wifes medicare starts in June and will go to [employer], he is going to go to [employer] - he was doing that before COVID. Since he is quitting his job this weekend (2 weeks notice) - he will start going to the gym soon.    Expected Outcomes Short: Continue to bump up workloads Long: Continue to improve stamina Short:attempt reaching THR range Long: increase MET level Short: Add incline back to treadmill Long: Continue to improve stamina Short: start [employer] on off days (may start this week if his wife will go with him) Long: Continue to improve stamina           Discharge Exercise Prescription (Final Exercise Prescription Changes):  Exercise Prescription Changes - 01/28/21 1400      Response to Exercise   Blood Pressure (Admit) 130/62    Blood Pressure (Exercise) 126/68    Blood Pressure (Exit) 118/68    Heart Rate (Admit) 70 bpm    Heart Rate (Exercise) 79 bpm    Heart Rate (Exit) 68 bpm    Rating of Perceived Exertion (Exercise) 13    Symptoms none    Duration Continue with 30 min of aerobic exercise without signs/symptoms of physical distress.    Intensity THRR unchanged      Progression   Progression Continue to progress workloads to maintain intensity without signs/symptoms of physical distress.    Average METs 2.9      Resistance Training   Training Prescription Yes    Weight 3 lb    Reps 10-15      Interval Training   Interval Training No      NuStep   Level 4    Minutes 15    METs 2.9      Home Exercise Plan   Plans to continue exercise at Home (comment)   walk, staff videos, considering [employer]   Frequency Add 3 additional days to program exercise sessions.    Initial Home Exercises Provided 11/14/20           Nutrition:  Target Goals: Understanding of nutrition guidelines, daily intake of sodium <1512m, cholesterol <2057m calories 30%  from fat and 7% or less from saturated fats, daily to have 5 or more servings of fruits and vegetables.  Education: All About Nutrition: -Group instruction provided by verbal, written material, interactive activities, discussions, models, and posters to present general guidelines for heart healthy nutrition including fat, fiber, MyPlate, the role of sodium in heart healthy nutrition, utilization of the nutrition label, and utilization of this knowledge for meal planning. Follow up email sent as well. Written material given at graduation. Flow[facility]  Date 01/30/21  [facility] Review Code 1- Verbalizes Understanding      Biometrics:  Pre Biometrics - 10/24/20 1351      Pre Biometrics   Height 5' 10"  (  1.778 m)    Weight 343 lb 1.6 oz (155.6 kg)    BMI (Calculated) 49.23    Single Leg Stand 18.7 seconds            Nutrition Therapy Plan and Nutrition Goals:  Nutrition Therapy & Goals - 11/05/20 0848      Nutrition Therapy   Diet Heart healthy, low Na, diabetes friendly    Drug/Food Interactions Statins/Certain Fruits    Protein (specify units) 120g    Fiber 30 grams    Whole Grain Foods 3 servings    Saturated Fats 12 max. grams    Fruits and Vegetables 8 servings/day    Sodium 1.5 grams      Personal Nutrition Goals   Nutrition Goal ST: check in with feelings before, during, and after eating. create list of foods he enjoys. Try to drink more water instead of diet soda. LT: feel in control around food, prepare more meals and have snacks on hand.    Comments He is on insulin. He feels he is very picky - he doesn't like fish. he doesn't care for boiled or baked chicken - texture. He enjoys [place] food. he also likes diet mountain dew, but would like to drink more water even though he doesn't care for it. He has been trying to eat high protein. he counts his CHO. He only likes apples for fruit. He  feels out of control with food. He feels that he is out of control with food and feels that he wants to make changes, but in the moment is hard. Discussed heart healthy eating and diabetes friendly eating.  Discussed mindful eating and eating more intuitively - leftovers are ok and its ok if meals aren't perfect. Asked him to provide list of foods he enjoys and is willing to try to help make a plan for him. Asked him to observe how he feels before, during, and after eating food and where he sits on the hunger scale to help address feeling out of control around food. His wife is depressed and wants him to sit on the couch with her which causes him to midlessly eat - encouraged staying busy to avoid boredom and choosing lower calorie snacks when eating outside of hunger.      Intervention Plan   Intervention Prescribe, educate and counsel regarding individualized specific dietary modifications aiming towards targeted core components such as weight, hypertension, lipid management, diabetes, heart failure and other comorbidities.;Nutrition handout(s) given to patient.    Expected Outcomes Short Term Goal: Understand basic principles of dietary content, such as calories, fat, sodium, cholesterol and nutrients.;Short Term Goal: A plan has been developed with personal nutrition goals set during dietitian appointment.;Long Term Goal: Adherence to prescribed nutrition plan.           Nutrition Assessments:  MEDIFICTS Score Key:  ?70 Need to make dietary changes   40-70 Heart Healthy Diet  ? 40 Therapeutic Level Cholesterol Diet  Flowsheet Row Cardiac Rehab from 10/29/2020 in [facility] Cardiac and Pulmonary Rehab  Picture Your Plate Total Score on Admission 56     Picture Your Plate Scores:  <85 Unhealthy dietary pattern with much room for improvement.  41-50 Dietary pattern unlikely to meet recommendations for good health and room for improvement.  51-60 More healthful dietary pattern, with some room  for improvement.   >60 Healthy dietary pattern, although there may be some specific behaviors that could be improved.    Nutrition Goals Re-Evaluation:  Nutrition Goals Re-Evaluation  [place] Name 12/03/20 1014 12/31/20 0950 01/28/21 0952         Goals   Current Weight 342 lb 9.6 oz (155.4 kg) -- --     Nutrition Goal His cardiologist recommends the DASH diet.  He is making a list of foods he is and isnt willing to try for the RD. [name] has worked on his list for the RD - he will bring it in.  He has cut down on portion sizes and is buying less snacks to have at home.  He is also trying to drink more water. ST: meet with RD for meal ideas based off of food he enjoys LT: follow MyPlate, manage BG     Comment -- -- [name] brought in his food list (things he eats, foods he would be willing to try, and foods he does not eat). RD to review food list and meet with [name] to discuss.     Expected Outcome Short: meet with RD Long: use diet to help manage risk factors successfully SHort: bring list to RD Long: work on heart healthy DM freindly diet ST: meet with RD for meal ideas based off of food he enjoys LT: follow MyPlate, manage BG            Nutrition Goals Discharge (Final Nutrition Goals Re-Evaluation):  Nutrition Goals Re-Evaluation - 01/28/21 2458      Goals   Nutrition Goal ST: meet with RD for meal ideas based off of food he enjoys LT: follow MyPlate, manage BG    Comment [name] brought in his food list (things he eats, foods he would be willing to try, and foods he does not eat). RD to review food list and meet with [name] to discuss.    Expected Outcome ST: meet with RD for meal ideas based off of food he enjoys LT: follow MyPlate, manage BG           Psychosocial: Target Goals: Acknowledge presence or absence of significant depression and/or stress, maximize coping skills, provide positive support system. Participant is able to verbalize types and ability to use techniques and skills  needed for reducing stress and depression.   Education: Stress, Anxiety, and Depression - Group verbal and visual presentation to define topics covered.  Reviews how body is impacted by stress, anxiety, and depression.  Also discusses healthy ways to reduce stress and to treat/manage anxiety and depression.  Written material given at graduation. Flowsheet Row Cardiac Rehab from 09/15/2017 in [facility] Cardiac and Pulmonary Rehab  Date 07/07/17  Educator [facility]  Instruction Review Code 1- [employer] Understanding      Education: Sleep Hygiene -Provides group verbal and written instruction about how sleep can affect your health.  Define sleep hygiene, discuss sleep cycles and impact of sleep habits. Review good sleep hygiene tips.    Initial Review & Psychosocial Screening:  Initial Psych Review & Screening - 10/17/20 0916      Initial Review   Current issues with None Identified      Family Dynamics   Good Support System? Yes   Wife and children     Barriers   Psychosocial barriers to participate in program There are no identifiable barriers or psychosocial needs.      Screening Interventions   Interventions Encouraged to exercise    Expected Outcomes Short Term goal: Utilizing psychosocial counselor, staff and physician to assist with identification of specific Stressors or current issues interfering with healing process. Setting desired goal for each stressor or current  issue identified.;Long Term Goal: Stressors or current issues are controlled or eliminated.;Short Term goal: Identification and review with participant of any Quality of Life or Depression concerns found by scoring the questionnaire.;Long Term goal: The participant improves quality of Life and PHQ9 Scores as seen by post scores and/or verbalization of changes           Quality of Life Scores:   Quality of Life - 10/24/20 0942      Quality of Life   Select Quality of Life      Quality of Life Scores   Health/Function  Pre 14.9 %    Socioeconomic Pre 22.5 %    Psych/Spiritual Pre 23.43 %    Family Pre 21.6 %    GLOBAL Pre 19.21 %          Scores of 19 and below usually indicate a poorer quality of life in these areas.  A difference of  2-3 points is a clinically meaningful difference.  A difference of 2-3 points in the total score of the Quality of Life Index has been associated with significant improvement in overall quality of life, self-image, physical symptoms, and general health in studies assessing change in quality of life.  PHQ-9: Recent Review Flowsheet Data    Depression screen [facility] 2/9 10/24/2020 12/28/2017 12/28/2017 09/28/2017 09/15/2017   Decreased Interest 1 0 0 0 1   Down, Depressed, Hopeless 0 0 0 0 0   PHQ - 2 Score 1 0 0 0 1   Altered sleeping 0 - - - 0   Tired, decreased energy 1 - - - 1   Change in appetite 2 - - - 1   Feeling bad or failure about yourself  0 - - - 1   Trouble concentrating 0 - - - 0   Moving slowly or fidgety/restless 0 - - - 0   Suicidal thoughts 0 - - - 0   PHQ-9 Score 4 - - - 4   Difficult doing work/chores Somewhat difficult - - - Not difficult at all     Interpretation of Total Score  Total Score Depression Severity:  1-4 = Minimal depression, 5-9 = Mild depression, 10-14 = Moderate depression, 15-19 = Moderately severe depression, 20-27 = Severe depression   Psychosocial Evaluation and Intervention:  Psychosocial Evaluation - 10/17/20 0937      Psychosocial Evaluation & Interventions   Comments [name] has no barriers to attending the program. He has been through the program before and has had several life changes since last attendance. [name] is retired and his wife is on disability for medical reason. He states that he does not have any more stress than the usual daily life stress. He lives with his wife and 2 teenage daughters, and male dog and cat. House full of women per [name].  He plans to look for a part time job in the future for something to do. He has  not been able to lose weight and is talking about how to manage weight loss. He is looking forward to attending the program.    Expected Outcomes STG Attend all scheduled sessions, meet with RD to learn steps toward weight loss LTG: COntinue to manage weight with the tools and resources provided during program, continue to work toward healty lifestyle and stress free life.    Continue Psychosocial Services  Follow up required by staff           Psychosocial Re-Evaluation:  Psychosocial Re-Evaluation    Row Name  12/03/20 1005 12/31/20 0952 12/31/20 0957 01/28/21 1000       Psychosocial Re-Evaluation   Current issues with Current Stress Concerns;Current Sleep Concerns Current Stress Concerns;Current Sleep Concerns -- Current Stress Concerns;Current Sleep Concerns    Comments [name] says he sleeps like a rock with CPAP.  He and his daughter have both been to ER in the past week and that has caused a little stress.  He likes to do wood work to help with stress.  He also takes breaks from working to help reduce stress. [name] stil has some stress ealted to his 28 year old daughter.  Some days stress is better.  He sits in the yard with his dogs to help with stress and is also working some which he says helps with stress. Marcs wife has NASH diease and that also causes some stress.  He does feel he can cope well most days. Marc's wife has NASH diease and that causes some stress. He rpeorts stress from new dogs and children right now. He would like to get away - may go on vacation after his 2 week notice with his job and may see his sister who he hasn't seen since Thanksgiving. His daughters are 72 and 66 and he doesn't feel like he can leave them alone. He has fluid retention from not being able to take his pills due to work - putting in two week notice. He plans on joining [employer] for his off days. He reports doing ok with his stress; he will go outside and sit on his deck and play with the dogs and  listen to radios. He thinks he has been sleeping ok - but his wife says he is restless - he has a sleep study July 3rd. He continues wearing his CPAP.    Expected Outcomes Short: attend stress management education Long: manage stress long term Short: continue to use stress and playing with dogs to manage stress Long:  manage stress long term -- Short: continue to play with dogs to manage stress, join [employer] Long: manage stress long term    Interventions -- -- -- Encouraged to attend Cardiac Rehabilitation for the exercise    Continue Psychosocial Services  -- -- -- Follow up required by staff         Initial Review   Source of Stress Concerns -- -- -- Occupation;Family           Psychosocial Discharge (Final Psychosocial Re-Evaluation):  Psychosocial Re-Evaluation - 01/28/21 1000      Psychosocial Re-Evaluation   Current issues with Current Stress Concerns;Current Sleep Concerns    Comments Marc's wife has NASH diease and that causes some stress. He rpeorts stress from new dogs and children right now. He would like to get away - may go on vacation after his 2 week notice with his job and may see his sister who he hasn't seen since Thanksgiving. His daughters are 62 and 50 and he doesn't feel like he can leave them alone. He has fluid retention from not being able to take his pills due to work - putting in two week notice. He plans on joining [employer] for his off days. He reports doing ok with his stress; he will go outside and sit on his deck and play with the dogs and listen to radios. He thinks he has been sleeping ok - but his wife says he is restless - he has a sleep study July 3rd. He continues wearing his CPAP.  Expected Outcomes Short: continue to play with dogs to manage stress, join [employer] Long: manage stress long term    Interventions Encouraged to attend Cardiac Rehabilitation for the exercise    Continue Psychosocial Services  Follow up required by staff       Initial Review   Source of Stress Concerns Occupation;Family           Vocational Rehabilitation: Provide vocational rehab assistance to qualifying candidates.   Vocational Rehab Evaluation & Intervention:  Vocational Rehab - 10/17/20 0919      Initial Vocational Rehab Evaluation & Intervention   Assessment shows need for Vocational Rehabilitation No           Education: Education Goals: Education classes will be provided on a variety of topics geared toward better understanding of heart health and risk factor modification. Participant will state understanding/return demonstration of topics presented as noted by education test scores.  Learning Barriers/Preferences:  Learning Barriers/Preferences - 10/17/20 0919      Learning Barriers/Preferences   Learning Barriers None    Learning Preferences None           General Cardiac Education Topics:  AED/CPR: - Group verbal and written instruction with the use of models to demonstrate the basic use of the AED with the basic ABC's of resuscitation.   Anatomy and Cardiac Procedures: - Group verbal and visual presentation and models provide information about basic cardiac anatomy and function. Reviews the testing methods done to diagnose heart disease and the outcomes of the test results. Describes the treatment choices: Medical Management, Angioplasty, or Coronary Bypass Surgery for treating various heart conditions including Myocardial Infarction, Angina, Valve Disease, and Cardiac Arrhythmias.  Written material given at graduation. Flowsheet Row Cardiac Rehab from 09/15/2017 in [facility] Cardiac and Pulmonary Rehab  Date 06/07/17  Educator [facility]  Instruction Review Code 1- Verbalizes Understanding      Medication Safety: - Group verbal and visual instruction to review commonly prescribed medications for heart and lung disease. Reviews the medication, class of the drug, and side effects. Includes the steps to properly store meds  and maintain the prescription regimen.  Written material given at graduation. Flowsheet Row Cardiac Rehab from 01/30/2021 in [facility] Cardiac and Pulmonary Rehab  Date 12/05/20  Educator [facility]  Instruction Review Code 1- Verbalizes Understanding      Intimacy: - Group verbal instruction through game format to discuss how heart and lung disease can affect sexual intimacy. Written material given at graduation.. Flowsheet Row Cardiac Rehab from 01/30/2021 in [facility] Cardiac and Pulmonary Rehab  Date 11/07/20  Educator [facility]  Instruction Review Code 1- Verbalizes Understanding      Know Your Numbers and Heart Failure: - Group verbal and visual instruction to discuss disease risk factors for cardiac and pulmonary disease and treatment options.  Reviews associated critical values for Overweight/Obesity, Hypertension, Cholesterol, and Diabetes.  Discusses basics of heart failure: signs/symptoms and treatments.  Introduces Heart Failure Zone chart for action plan for heart failure.  Written material given at graduation. Flowsheet Row Cardiac Rehab from 09/15/2017 in [facility] Cardiac and Pulmonary Rehab  Date 06/07/17  Educator [facility]  Instruction Review Code 1- Verbalizes Understanding      Infection Prevention: - Provides verbal and written material to individual with discussion of infection control including proper hand washing and proper equipment cleaning during exercise session. Flowsheet Row Cardiac Rehab from 01/30/2021 in [facility] Cardiac and Pulmonary Rehab  Education need identified 10/24/20  Date 10/24/20  Educator [place]  Instruction  Review Code 1- Verbalizes Understanding      Falls Prevention: - Provides verbal and written material to individual with discussion of falls prevention and safety. Flowsheet Row Cardiac Rehab from 01/30/2021 in [facility] Cardiac and Pulmonary Rehab  Education need identified 10/24/20  Date 10/24/20  Educator [place]  Instruction Review Code 1- Verbalizes Understanding       Other: -Provides group and verbal instruction on various topics (see comments)   Knowledge Questionnaire Score:  Knowledge Questionnaire Score - 10/24/20 0933      Knowledge Questionnaire Score   Pre Score 22/26: Angina, Nitro, Exercise           Core Components/Risk Factors/Patient Goals at Admission:  Personal Goals and Risk Factors at Admission - 10/24/20 1352      Core Components/Risk Factors/Patient Goals on Admission    Weight Management Yes;Obesity;Weight Loss    Intervention Weight Management: Develop a combined nutrition and exercise program designed to reach desired caloric intake, while maintaining appropriate intake of nutrient and fiber, sodium and fats, and appropriate energy expenditure required for the weight goal.;Weight Management: Provide education and appropriate resources to help participant work on and attain dietary goals.;Weight Management/Obesity: Establish reasonable short term and long term weight goals.;Obesity: Provide education and appropriate resources to help participant work on and attain dietary goals.    Admit Weight 343 lb (155.6 kg)    Goal Weight: Short Term 338 lb (153.3 kg)    Goal Weight: Long Term 323 lb (146.5 kg)    Expected Outcomes Short Term: Continue to assess and modify interventions until short term weight is achieved;Long Term: Adherence to nutrition and physical activity/exercise program aimed toward attainment of established weight goal;Weight Loss: Understanding of general recommendations for a balanced deficit meal plan, which promotes 1-2 lb weight loss per week and includes a negative energy balance of [phone] kcal/d    Diabetes Yes    Intervention Provide education about signs/symptoms and action to take for hypo/hyperglycemia.;Provide education about proper nutrition, including hydration, and aerobic/resistive exercise prescription along with prescribed medications to achieve blood glucose in normal ranges: Fasting glucose  65-99 mg/dL    Expected Outcomes Short Term: Participant verbalizes understanding of the signs/symptoms and immediate care of hyper/hypoglycemia, proper foot care and importance of medication, aerobic/resistive exercise and nutrition plan for blood glucose control.;Long Term: Attainment of HbA1C < 7%.    Heart Failure Yes    Intervention Provide a combined exercise and nutrition program that is supplemented with education, support and counseling about heart failure. Directed toward relieving symptoms such as shortness of breath, decreased exercise tolerance, and extremity edema.    Expected Outcomes Improve functional capacity of life;Short term: Attendance in program 2-3 days a week with increased exercise capacity. Reported lower sodium intake. Reported increased fruit and vegetable intake. Reports medication compliance.;Short term: Daily weights obtained and reported for increase. Utilizing diuretic protocols set by physician.;Long term: Adoption of self-care skills and reduction of barriers for early signs and symptoms recognition and intervention leading to self-care maintenance.    Hypertension Yes    Intervention Provide education on lifestyle modifcations including regular physical activity/exercise, weight management, moderate sodium restriction and increased consumption of fresh fruit, vegetables, and low fat dairy, alcohol moderation, and smoking cessation.;Monitor prescription use compliance.    Expected Outcomes Short Term: Continued assessment and intervention until BP is < 140/35m HG in hypertensive participants. < 130/824mHG in hypertensive participants with diabetes, heart failure or chronic kidney disease.;Long Term: Maintenance of blood pressure at goal levels.  Lipids Yes    Intervention Provide education and support for participant on nutrition & aerobic/resistive exercise along with prescribed medications to achieve LDL <24m, HDL >435m    Expected Outcomes Short Term:  Participant states understanding of desired cholesterol values and is compliant with medications prescribed. Participant is following exercise prescription and nutrition guidelines.;Long Term: Cholesterol controlled with medications as prescribed, with individualized exercise RX and with personalized nutrition plan. Value goals: LDL < 7055mHDL > 40 mg.           Education:Diabetes - Individual verbal and written instruction to review signs/symptoms of diabetes, desired ranges of glucose level fasting, after meals and with exercise. Acknowledge that pre and post exercise glucose checks will be done for 3 sessions at entry of program. [place] [facility]  Education need identified 10/24/20  Date 10/24/20  Educator [place] Ele[place]struction Review Code 1- Verbalizes Understanding      Core Components/Risk Factors/Patient Goals Review:   Goals and Risk Factor Review    Row Name 12/03/20 1000 12/31/20 0955 12/31/20 0958 01/28/21 0955       Core Components/Risk Factors/Patient Goals Review   Personal Goals Review Weight Management/Obesity;Improve shortness of breath with ADL's;Heart Failure;Diabetes Weight Management/Obesity;Improve shortness of breath with ADL's;Heart Failure;Diabetes -- Weight Management/Obesity;Improve shortness of breath with ADL's;Heart Failure;Diabetes    Review MarAltamese Dilling now taking 5mg78miquis and Cardizem due to new onset of a fib.  He saw cardiologist yesterday.  He does check BP at home - it has been elevated at [id number] - it was 138 948Dr yesterday.  MarcAltamese Dillings his sugars have been good.  He started Flomax and he feels this has helped. MarcAltamese Dillingtaking meds as directed.  He doe smonitor BG and BP at home. MarcAltamese Dillingtaking meds as directed.  He does monitor BG and BP at home.  His weight was down today since he has been working.  He would like to travel some but doesn't have anyone to watch his dogs and home. He feels  his weight has been stable. MarcAltamese Dillingworking with RD on diet. He continues to monitor his BG and BP at home - he has a continuous BG monitor (he also checks it manually 4x/day), he checks BP 1-2x/week. BG has been around 140, some highs and some lows as he feels he is not taking his insulin right - he is eating before he takes it, but reports doing better with insulin management. BP has been running 130-140/70; he has a wrist cuff which he feels is not as accurate. Today BP was 130/62 at rehab. He feels his shortness of breath has been about the same - he continues to attend rehab. He reports having some edema in his lower extremities - he feels the part time job prevents him from taking fluid pills in afternoon - 3 doctors told him to quit his job - he is going to this weekend.    Expected Outcomes Short: continue to monitor BP/Bg at home  Long: manage risk factors long term -- Short:  continue to monitor vitals Long: manage risk factors long term Short:  continue to monitor vitals, work with RD, take fluid pills as prescribed Long: manage risk factors long term           Core Components/Risk Factors/Patient Goals at Discharge (Final Review):   Goals and Risk Factor Review - 01/28/21 0955      Core Components/Risk  Factors/Patient Goals Review   Personal Goals Review Weight Management/Obesity;Improve shortness of breath with ADL's;Heart Failure;Diabetes    Review He feels his weight has been stable. [name] is working with RD on diet. He continues to monitor his BG and BP at home - he has a continuous BG monitor (he also checks it manually 4x/day), he checks BP 1-2x/week. BG has been around 140, some highs and some lows as he feels he is not taking his insulin right - he is eating before he takes it, but reports doing better with insulin management. BP has been running 130-140/70; he has a wrist cuff which he feels is not as accurate. Today BP was 130/62 at rehab. He feels his shortness of breath has been  about the same - he continues to attend rehab. He reports having some edema in his lower extremities - he feels the part time job prevents him from taking fluid pills in afternoon - 3 doctors told him to quit his job - he is going to this weekend.    Expected Outcomes Short:  continue to monitor vitals, work with RD, take fluid pills as prescribed Long: manage risk factors long term           ITP Comments:  ITP Comments    Row Name 10/17/20 0933 10/24/20 0932 10/29/20 1017 11/13/20 0640 12/11/20 0750   ITP Comments Virtual orientation call completed today. he has an appointment on Date: 10/24/2020 for EP eval and gym Orientation.  Documentation of diagnosis can be found in [employer]  Date: 09/27/20 and 07/19/20. Completed 6MWT and gym orientation. Initial ITP created and sent for review to Dr. Emily Filbert, Medical Director. First full day of exercise!  Patient was oriented to gym and equipment including functions, settings, policies, and procedures.  Patient's individual exercise prescription and treatment plan were reviewed.  All starting workloads were established based on the results of the 6 minute walk test done at initial orientation visit.  The plan for exercise progression was also introduced and progression will be customized based on patient's performance and goals. 30 Day review completed. Medical Director ITP review done, changes made as directed, and signed approval by Medical Director. 30 Day review completed. Medical Director ITP review done, changes made as directed, and signed approval by Medical Director. only 3 visits in March   Row Name 01/08/21 1003 02/05/21 0710         ITP Comments 30 Day review completed. Medical Director ITP review done, changes made as directed, and signed approval by Medical Director. 30 Day review completed. Medical Director ITP review done, changes made as directed, and signed approval by Medical Director.             Comments:

## 2021-02-06 ENCOUNTER — Other Ambulatory Visit: Payer: Self-pay

## 2021-02-06 DIAGNOSIS — I5032 Chronic diastolic (congestive) heart failure: Secondary | ICD-10-CM | POA: Diagnosis not present

## 2021-02-06 NOTE — Progress Notes (Signed)
Daily Session Note  Patient Details  Name: Philip Cook MRN: [id number] Date of Birth: 08/02/1961 Referring Provider:   Flowsheet Row Cardiac Rehab from 10/24/2020 in [facility] Cardiac and Pulmonary Rehab  Referring Provider Isaias Cowman MD      Encounter Date: 02/06/2021  Check In:  Session Check In - 02/06/21 0959      Check-In   Supervising physician immediately available to respond to emergencies See telemetry face sheet for immediately available ER MD    Location [facility]-Cardiac & Pulmonary Rehab    Staff Present Birdie Sons, MPA, RN;Melissa Caiola RDN, Rowe Pavy, BA, ACSM CEP, Exercise Physiologist    Virtual Visit No    Medication changes reported     No    Fall or balance concerns reported    No    Tobacco Cessation No Change    Warm-up and Cool-down Performed on first and last piece of equipment    Resistance Training Performed Yes    VAD Patient? No    PAD/SET Patient? No      Pain Assessment   Currently in Pain? No/denies              Social History   Tobacco Use  Smoking Status Never Smoker  Smokeless Tobacco Never Used    Goals Met:  Independence with exercise equipment Exercise tolerated well No report of cardiac concerns or symptoms Strength training completed today  Goals Unmet:  Not Applicable  Comments: Pt able to follow exercise prescription today without complaint.  Will continue to monitor for progression.    Dr. Emily Filbert is Medical Director for [facility] Pulmonary [facility].

## 2021-02-11 ENCOUNTER — Other Ambulatory Visit: Payer: Self-pay

## 2021-02-11 DIAGNOSIS — I5032 Chronic diastolic (congestive) heart failure: Secondary | ICD-10-CM

## 2021-02-11 NOTE — Progress Notes (Signed)
Daily Session Note  Patient Details  Name: Philip Cook MRN: [id number] Date of Birth: [birth year] Referring Provider:   Flowsheet Row Cardiac Rehab from 10/24/2020 in [facility] Cardiac and Pulmonary Rehab  Referring Provider Isaias Cowman MD      Encounter Date: 02/11/2021  Check In:  Session Check In - 02/11/21 0956      Check-In   Supervising physician immediately available to respond to emergencies See telemetry face sheet for immediately available ER MD    Location [facility]-Cardiac & Pulmonary Rehab    Staff Present Birdie Sons, MPA, RN;Amanda Oletta Darter, BA, ACSM CEP, Exercise Physiologist;Kara Eliezer Bottom, MS, ASCM CEP, Exercise Physiologist    Virtual Visit No    Medication changes reported     No    Fall or balance concerns reported    No    Tobacco Cessation No Change    Warm-up and Cool-down Performed on first and last piece of equipment    Resistance Training Performed Yes    VAD Patient? No    PAD/SET Patient? No      Pain Assessment   Currently in Pain? No/denies              Social History   Tobacco Use  Smoking Status Never Smoker  Smokeless Tobacco Never Used    Goals Met:  Independence with exercise equipment Exercise tolerated well No report of cardiac concerns or symptoms Strength training completed today  Goals Unmet:  Not Applicable  Comments: Pt able to follow exercise prescription today without complaint.  Will continue to monitor for progression.    Dr. Emily Filbert is Medical Director for [place].  Dr. Ottie Glazier is Medical Director for [facility] Pulmonary Rehabilitation.

## 2021-02-13 ENCOUNTER — Other Ambulatory Visit: Payer: Self-pay

## 2021-02-13 DIAGNOSIS — I5032 Chronic diastolic (congestive) heart failure: Secondary | ICD-10-CM | POA: Diagnosis not present

## 2021-02-13 NOTE — Progress Notes (Signed)
Daily Session Note  Patient Details  Name: Philip Cook MRN: [id number] Date of Birth: 03/21/1961 Referring Provider:   Flowsheet Row Cardiac Rehab from 10/24/2020 in [facility] Cardiac and Pulmonary Rehab  Referring Provider Isaias Cowman MD      Encounter Date: 02/13/2021  Check In:  Session Check In - 02/13/21 0946      Check-In   Supervising physician immediately available to respond to emergencies See telemetry face sheet for immediately available ER MD    Location [facility]-Cardiac & Pulmonary Rehab    Staff Present Birdie Sons, MPA, RN;Melissa Caiola RDN, Rowe Pavy, BA, ACSM CEP, Exercise Physiologist    Virtual Visit No    Medication changes reported     No    Fall or balance concerns reported    No    Tobacco Cessation No Change    Warm-up and Cool-down Performed on first and last piece of equipment    Resistance Training Performed Yes    VAD Patient? No    PAD/SET Patient? No      Pain Assessment   Currently in Pain? No/denies              Social History   Tobacco Use  Smoking Status Never Smoker  Smokeless Tobacco Never Used    Goals Met:  Independence with exercise equipment Exercise tolerated well No report of cardiac concerns or symptoms Strength training completed today  Goals Unmet:  Not Applicable  Comments: Pt able to follow exercise prescription today without complaint.  Will continue to monitor for progression.    Dr. Emily Filbert is Medical Director for [place].  Dr. Ottie Glazier is Medical Director for [facility] Pulmonary Rehabilitation.

## 2021-02-18 ENCOUNTER — Other Ambulatory Visit: Payer: Self-pay

## 2021-02-18 ENCOUNTER — Encounter: Payer: BC Managed Care – PPO | Admitting: *Deleted

## 2021-02-18 DIAGNOSIS — I5032 Chronic diastolic (congestive) heart failure: Secondary | ICD-10-CM | POA: Diagnosis not present

## 2021-02-18 NOTE — Progress Notes (Signed)
Daily Session Note  Patient Details  Name: Philip Cook MRN: [id number] Date of Birth: [birth year] Referring Provider:   Flowsheet Row Cardiac Rehab from 10/24/2020 in [facility] Cardiac and Pulmonary Rehab  Referring Provider Isaias Cowman MD      Encounter Date: 02/18/2021  Check In:  Session Check In - 02/18/21 1004      Check-In   Supervising physician immediately available to respond to emergencies See telemetry face sheet for immediately available ER MD    Location [facility]-Cardiac & Pulmonary Rehab    Staff Present Heath Lark, RN, BSN, Jacklynn Bue, MS, ASCM CEP, Exercise Physiologist;Melissa Caiola RDN, LDN    Virtual Visit No    Medication changes reported     No    Fall or balance concerns reported    No    Warm-up and Cool-down Performed on first and last piece of equipment    Resistance Training Performed Yes    VAD Patient? No    PAD/SET Patient? No      Pain Assessment   Currently in Pain? No/denies               6 Minute Walk    Row Name 10/24/20 1346 02/18/21 1113       6 Minute Walk   Phase Initial Discharge    Distance 1250 feet 1440 feet    Distance % Change -- 15.2 %    Distance Feet Change -- 190 ft    Walk Time 6 minutes 6 minutes    # of Rest Breaks 0 0    MPH 2.36 2.72    METS 2.33 3    RPE 13 14    Perceived Dyspnea  1 2    VO2 Peak 8.15 10.53    Symptoms Yes (comment) Yes (comment)    Comments Hip pain 3/10 Hip pain 6/10, leg pain 5/10    Resting HR 69 bpm 70 bpm    Resting BP 118/62 138/70    Resting Oxygen Saturation  97 % 95 %    Exercise Oxygen Saturation  during 6 min walk 98 % 95 %    Max Ex. HR 97 bpm 110 bpm    Max Ex. BP 154/64 182/64    2 Minute Post BP 128/66 138/62           Social History   Tobacco Use  Smoking Status Never Smoker  Smokeless Tobacco Never Used    Goals Met:  Independence with exercise equipment Exercise tolerated well No report of cardiac concerns or symptoms  Goals Unmet:  Not  Applicable  Comments: Pt able to follow exercise prescription today without complaint.  Will continue to monitor for progression.    Dr. Emily Filbert is Medical Director for [place].  Dr. Ottie Glazier is Medical Director for [facility] Pulmonary Rehabilitation.

## 2021-02-19 NOTE — Patient Instructions (Signed)
Discharge Patient Instructions  Patient Details  Name: Philip Cook MRN: [id number] Date of Birth: November 13, 1960 Referring Provider:  [name], MD   Number of Visits: 83  Reason for Discharge:  Patient reached a stable level of exercise. Patient independent in their exercise. Patient has met program and personal goals.  Smoking History:  Social History   Tobacco Use  Smoking Status Never Smoker  Smokeless Tobacco Never Used    Diagnosis:  Chronic diastolic heart failure ([place])  Initial Exercise Prescription:  Initial Exercise Prescription - 10/24/20 1300      Date of Initial Exercise RX and Referring Provider   Date 10/24/20    Referring Provider Isaias Cowman MD      Treadmill   MPH 1.7    Grade 0    Minutes 15    METs 2.3      Recumbant Bike   Level 2    RPM 60    Watts 15    Minutes 15    METs 2.3      NuStep   Level 2    SPM 80    Minutes 15    METs 2.3      T5 Nustep   Level 1    SPM 80    Minutes 15    METs 2.3      Biostep-RELP   Level 1    SPM 50    Minutes 15    METs 2.3      Prescription Details   Frequency (times per week) 2    Duration Progress to 30 minutes of continuous aerobic without signs/symptoms of physical distress      Intensity   THRR 40-80% of Max Heartrate 106-144    Ratings of Perceived Exertion 11-13    Perceived Dyspnea 0-4      Progression   Progression Continue to progress workloads to maintain intensity without signs/symptoms of physical distress.      Resistance Training   Training Prescription Yes    Weight 3 lb    Reps 10-15           Discharge Exercise Prescription (Final Exercise Prescription Changes):  Exercise Prescription Changes - 02/12/21 1500      Response to Exercise   Blood Pressure (Admit) 120/64    Blood Pressure (Exercise) 140/66    Blood Pressure (Exit) 106/58    Heart Rate (Admit) 67 bpm    Heart Rate (Exercise) 94 bpm    Heart Rate (Exit) 75 bpm    Oxygen  Saturation (Admit) 96 %    Oxygen Saturation (Exercise) 95 %    Oxygen Saturation (Exit) 94 %    Rating of Perceived Exertion (Exercise) 13    Perceived Dyspnea (Exercise) 0    Symptoms none    Duration Continue with 30 min of aerobic exercise without signs/symptoms of physical distress.    Intensity THRR unchanged      Progression   Progression Continue to progress workloads to maintain intensity without signs/symptoms of physical distress.    Average METs 3.48      Resistance Training   Training Prescription Yes    Weight 3 lb    Reps 10-15      Interval Training   Interval Training No      Treadmill   MPH 2.4    Grade 1    Minutes 15    METs 3.17      NuStep   Level 2    Minutes 15  METs 3.8      Home Exercise Plan   Plans to continue exercise at Home (comment)   walk, staff videos, considering [employer]   Frequency Add 3 additional days to program exercise sessions.    Initial Home Exercises Provided 11/14/20           Functional Capacity:  6 Minute Walk    Row Name 10/24/20 1346 02/18/21 1113       6 Minute Walk   Phase Initial Discharge    Distance 1250 feet 1440 feet    Distance % Change -- 15.2 %    Distance Feet Change -- 190 ft    Walk Time 6 minutes 6 minutes    # of Rest Breaks 0 0    MPH 2.36 2.72    METS 2.33 3    RPE 13 14    Perceived Dyspnea  1 2    VO2 Peak 8.15 10.53    Symptoms Yes (comment) Yes (comment)    Comments Hip pain 3/10 Hip pain 6/10, leg pain 5/10    Resting HR 69 bpm 70 bpm    Resting BP 118/62 138/70    Resting Oxygen Saturation  97 % 95 %    Exercise Oxygen Saturation  during 6 min walk 98 % 95 %    Max Ex. HR 97 bpm 110 bpm    Max Ex. BP 154/64 182/64    2 Minute Post BP 128/66 138/62

## 2021-02-20 ENCOUNTER — Encounter: Payer: BC Managed Care – PPO | Attending: Cardiology | Admitting: *Deleted

## 2021-02-20 ENCOUNTER — Other Ambulatory Visit: Payer: Self-pay

## 2021-02-20 DIAGNOSIS — I5032 Chronic diastolic (congestive) heart failure: Secondary | ICD-10-CM | POA: Insufficient documentation

## 2021-02-20 NOTE — Progress Notes (Signed)
Daily Session Note  Patient Details  Name: Philip Cook MRN: [id number] Date of Birth: 1961/07/23 Referring Provider:   Flowsheet Row Cardiac Rehab from 10/24/2020 in [facility] Cardiac and Pulmonary Rehab  Referring Provider Isaias Cowman MD      Encounter Date: 02/20/2021  Check In:  Session Check In - 02/20/21 1038      Check-In   Supervising physician immediately available to respond to emergencies See telemetry face sheet for immediately available ER MD    Location [facility]-Cardiac & Pulmonary Rehab    Staff Present Heath Lark, RN, BSN, Jacklynn Bue, MS, ASCM CEP, Exercise Physiologist;Amanda Oletta Darter, BA, ACSM CEP, Exercise Physiologist    Virtual Visit No    Medication changes reported     No    Fall or balance concerns reported    No    Warm-up and Cool-down Performed on first and last piece of equipment    Resistance Training Performed Yes    VAD Patient? No    PAD/SET Patient? No      Pain Assessment   Currently in Pain? No/denies              Social History   Tobacco Use  Smoking Status Never Smoker  Smokeless Tobacco Never Used    Goals Met:  Independence with exercise equipment Exercise tolerated well No report of cardiac concerns or symptoms Strength training completed today  Goals Unmet:  Not Applicable  Comments:  Rudie graduated today from  rehab with 32  sessions completed.  Details of the patient's exercise prescription and what He needs to do in order to continue the prescription and progress were discussed with patient.  Patient was given a copy of prescription and goals.  Patient verbalized understanding.  Demetrion plans to continue to exercise by walking.     Dr. Emily Filbert is Medical Director for [place].  Dr. Ottie Glazier is Medical Director for [facility] Pulmonary Rehabilitation.

## 2021-02-20 NOTE — Progress Notes (Addendum)
Discharge Progress Report  Patient Details  Name: Philip Cook MRN: [id number] Date of Birth: 11-Jul-1961 Referring Provider:   Flowsheet Row Cardiac Rehab from 10/24/2020 in [facility] Cardiac and Pulmonary Rehab  Referring Provider Isaias Cowman MD       Number of Visits: 32  Reason for Discharge:  Patient reached a stable level of exercise. Patient independent in their exercise. Patient has met program and personal goals.  Smoking History:  Social History   Tobacco Use  Smoking Status Never Smoker  Smokeless Tobacco Never Used    Diagnosis:  Chronic diastolic heart failure ([place])  ADL UCSD:   Initial Exercise Prescription:  Initial Exercise Prescription - 10/24/20 1300      Date of Initial Exercise RX and Referring Provider   Date 10/24/20    Referring Provider Isaias Cowman MD      Treadmill   MPH 1.7    Grade 0    Minutes 15    METs 2.3      Recumbant Bike   Level 2    RPM 60    Watts 15    Minutes 15    METs 2.3      NuStep   Level 2    SPM 80    Minutes 15    METs 2.3      T5 Nustep   Level 1    SPM 80    Minutes 15    METs 2.3      Biostep-RELP   Level 1    SPM 50    Minutes 15    METs 2.3      Prescription Details   Frequency (times per week) 2    Duration Progress to 30 minutes of continuous aerobic without signs/symptoms of physical distress      Intensity   THRR 40-80% of Max Heartrate 106-144    Ratings of Perceived Exertion 11-13    Perceived Dyspnea 0-4      Progression   Progression Continue to progress workloads to maintain intensity without signs/symptoms of physical distress.      Resistance Training   Training Prescription Yes    Weight 3 lb    Reps 10-15           Discharge Exercise Prescription (Final Exercise Prescription Changes):  Exercise Prescription Changes - 02/12/21 1500      Response to Exercise   Blood Pressure (Admit) 120/64    Blood Pressure (Exercise) 140/66    Blood Pressure  (Exit) 106/58    Heart Rate (Admit) 67 bpm    Heart Rate (Exercise) 94 bpm    Heart Rate (Exit) 75 bpm    Oxygen Saturation (Admit) 96 %    Oxygen Saturation (Exercise) 95 %    Oxygen Saturation (Exit) 94 %    Rating of Perceived Exertion (Exercise) 13    Perceived Dyspnea (Exercise) 0    Symptoms none    Duration Continue with 30 min of aerobic exercise without signs/symptoms of physical distress.    Intensity THRR unchanged      Progression   Progression Continue to progress workloads to maintain intensity without signs/symptoms of physical distress.    Average METs 3.48      Resistance Training   Training Prescription Yes    Weight 3 lb    Reps 10-15      Interval Training   Interval Training No      Treadmill   MPH 2.4    Grade 1  Minutes 15    METs 3.17      NuStep   Level 2    Minutes 15    METs 3.8      Home Exercise Plan   Plans to continue exercise at Home (comment)   walk, staff videos, considering [employer]   Frequency Add 3 additional days to program exercise sessions.    Initial Home Exercises Provided 11/14/20           Functional Capacity:  6 Minute Walk    Row Name 10/24/20 1346 02/18/21 1113       6 Minute Walk   Phase Initial Discharge    Distance 1250 feet 1440 feet    Distance % Change -- 15.2 %    Distance Feet Change -- 190 ft    Walk Time 6 minutes 6 minutes    # of Rest Breaks 0 0    MPH 2.36 2.72    METS 2.33 3    RPE 13 14    Perceived Dyspnea  1 2    VO2 Peak 8.15 10.53    Symptoms Yes (comment) Yes (comment)    Comments Hip pain 3/10 Hip pain 6/10, leg pain 5/10    Resting HR 69 bpm 70 bpm    Resting BP 118/62 138/70    Resting Oxygen Saturation  97 % 95 %    Exercise Oxygen Saturation  during 6 min walk 98 % 95 %    Max Ex. HR 97 bpm 110 bpm    Max Ex. BP 154/64 182/64    2 Minute Post BP 128/66 138/62           Psychological, QOL, Others - Outcomes: PHQ 2/9: Depression screen [facility] 2/9 02/20/2021 10/24/2020  12/28/2017 12/28/2017 09/28/2017  Decreased Interest 1 1 0 0 0  Down, Depressed, Hopeless 0 0 0 0 0  PHQ - 2 Score 1 1 0 0 0  Altered sleeping 1 0 - - -  Tired, decreased energy 2 1 - - -  Change in appetite 2 2 - - -  Feeling bad or failure about yourself  1 0 - - -  Trouble concentrating 0 0 - - -  Moving slowly or fidgety/restless 0 0 - - -  Suicidal thoughts 0 0 - - -  PHQ-9 Score 7 4 - - -  Difficult doing work/chores Somewhat difficult Somewhat difficult - - -   Quality of Life:  Quality of Life - 02/20/21 1153      Quality of Life   Select Quality of Life      Quality of Life Scores   Health/Function Pre 14.9 %    Health/Function Post 16.07 %    Health/Function % Change 7.85 %    Socioeconomic Pre 22.5 %    Socioeconomic Post 20.36 %    Socioeconomic % Change  -9.51 %    Psych/Spiritual Pre 23.43 %    Psych/Spiritual Post 21.07 %    Psych/Spiritual % Change -10.07 %    Family Pre 21.6 %    Family Post 20.4 %    Family % Change -5.56 %    GLOBAL Pre 19.21 %    GLOBAL Post 18.62 %    GLOBAL % Change -3.07 %          Nutrition & Weight - Outcomes:  Pre Biometrics - 10/24/20 1351      Pre Biometrics   Height 5' 10"  (1.778 m)    Weight 343 lb  1.6 oz (155.6 kg)    BMI (Calculated) 49.23    Single Leg Stand 18.7 seconds           Nutrition:  Nutrition Therapy & Goals - 11/05/20 0848      Nutrition Therapy   Diet Heart healthy, low Na, diabetes friendly    Drug/Food Interactions Statins/Certain Fruits    Protein (specify units) 120g    Fiber 30 grams    Whole Grain Foods 3 servings    Saturated Fats 12 max. grams    Fruits and Vegetables 8 servings/day    Sodium 1.5 grams      Personal Nutrition Goals   Nutrition Goal ST: check in with feelings before, during, and after eating. create list of foods he enjoys. Try to drink more water instead of diet soda. LT: feel in control around food, prepare more meals and have snacks on hand.    Comments He is on  insulin. He feels he is very picky - he doesn't like fish. he doesn't care for boiled or baked chicken - texture. He enjoys [place] food. he also likes diet mountain dew, but would like to drink more water even though he doesn't care for it. He has been trying to eat high protein. he counts his CHO. He only likes apples for fruit. He feels out of control with food. He feels that he is out of control with food and feels that he wants to make changes, but in the moment is hard. Discussed heart healthy eating and diabetes friendly eating.  Discussed mindful eating and eating more intuitively - leftovers are ok and its ok if meals aren't perfect. Asked him to provide list of foods he enjoys and is willing to try to help make a plan for him. Asked him to observe how he feels before, during, and after eating food and where he sits on the hunger scale to help address feeling out of control around food. His wife is depressed and wants him to sit on the couch with her which causes him to midlessly eat - encouraged staying busy to avoid boredom and choosing lower calorie snacks when eating outside of hunger.      Intervention Plan   Intervention Prescribe, educate and counsel regarding individualized specific dietary modifications aiming towards targeted core components such as weight, hypertension, lipid management, diabetes, heart failure and other comorbidities.;Nutrition handout(s) given to patient.    Expected Outcomes Short Term Goal: Understand basic principles of dietary content, such as calories, fat, sodium, cholesterol and nutrients.;Short Term Goal: A plan has been developed with personal nutrition goals set during dietitian appointment.;Long Term Goal: Adherence to prescribed nutrition plan.           Education Questionnaire Score:  Knowledge Questionnaire Score - 02/20/21 1153      Knowledge Questionnaire Score   Post Score 25/26           Goals reviewed with patient; copy given to patient.

## 2021-02-20 NOTE — Progress Notes (Addendum)
Cardiac Individual Treatment Plan  Patient Details  Name: Philip Cook MRN: [id number] Date of Birth: 1961-03-18 Referring Provider:   Flowsheet Row Cardiac Rehab from 10/24/2020 in [facility] Cardiac and Pulmonary Rehab  Referring Provider Isaias Cowman MD      Initial Encounter Date:  Flowsheet Row Cardiac Rehab from 10/24/2020 in [facility] Cardiac and Pulmonary Rehab  Date 10/24/20      Visit Diagnosis: Chronic diastolic heart failure ([place])  Patient's Home Medications on Admission:  Current Outpatient Medications:  .  apixaban (ELIQUIS) 5 MG TABS tablet, Take 1 tablet (5 mg total) by mouth 2 (two) times daily., Disp: 60 tablet, Rfl: 0 .  diltiazem (CARDIZEM CD) 180 MG 24 hr capsule, Take 1 capsule (180 mg total) by mouth daily., Disp: 30 capsule, Rfl: 0 .  furosemide (LASIX) 20 MG tablet, Take 1 tablet (20 mg total) by mouth daily., Disp: 30 tablet, Rfl: 0 .  HUMULIN R 500 UNIT/ML injection, Inject into the skin. Use in insulin pump, Disp: , Rfl:  .  Insulin Human (INSULIN PUMP) SOLN, Inject into the skin. , Disp: , Rfl:  .  Multiple Vitamin (MULTIVITAMIN WITH MINERALS) TABS tablet, Take 1 tablet by mouth daily., Disp: , Rfl:  .  oxybutynin (DITROPAN XL) 10 MG 24 hr tablet, Take 1 tablet (10 mg total) by mouth daily as needed (bladder spasm/stent pain). (Patient not taking: No sig reported), Disp: 30 tablet, Rfl: 0 .  prasugrel (EFFIENT) 10 MG TABS tablet, Take 10 mg by mouth daily., Disp: , Rfl:  .  rosuvastatin (CRESTOR) 10 MG tablet, Take 1 tablet (10 mg total) by mouth daily., Disp: 30 tablet, Rfl: 0 .  tamsulosin (FLOMAX) 0.4 MG CAPS capsule, Take 1 capsule (0.4 mg total) by mouth daily after supper. (Patient not taking: No sig reported), Disp: 14 capsule, Rfl: 0  Past Medical History: Past Medical History:  Diagnosis Date  . CHF (congestive heart failure) ([place])   . Coronary artery disease   . Diabetes mellitus without complication ([place])   . History of kidney stones   . Hx  MRSA infection   . Hyperlipidemia   . Hypertension   . Kidney stone   . Sleep apnea     Tobacco Use: Social History   Tobacco Use  Smoking Status Never Smoker  Smokeless Tobacco Never Used    Labs: Recent Review Flowsheet Data    Labs for ITP Cardiac and Pulmonary Rehab Latest Ref Rng & Units 08/30/2020 11/23/2020   Hemoglobin A1c 4.8 - 5.6 % 8.3(H) 8.2(H)       Exercise Target Goals: Exercise Program Goal: Individual exercise prescription set using results from initial 6 min walk test and THRR while considering  patient's activity barriers and safety.   Exercise Prescription Goal: Initial exercise prescription builds to 30-45 minutes a day of aerobic activity, 2-3 days per week.  Home exercise guidelines will be given to patient during program as part of exercise prescription that the participant will acknowledge.   Education: Aerobic Exercise: - Group verbal and visual presentation on the components of exercise prescription. Introduces F.I.T.T principle from ACSM for exercise prescriptions.  Reviews F.I.T.T. principles of aerobic exercise including progression. Written material given at graduation. Flowsheet Row Cardiac Rehab from 02/20/2021 in [facility] Cardiac and Pulmonary Rehab  Date 11/07/20  Educator jh  Instruction Review Code 1- Verbalizes Understanding      Education: Resistance Exercise: - Group verbal and visual presentation on the components of exercise prescription. Introduces F.I.T.T principle from ACSM  for exercise prescriptions  Reviews F.I.T.T. principles of resistance exercise including progression. Written material given at graduation.    Education: Exercise & Equipment Safety: - Individual verbal instruction and demonstration of equipment use and safety with use of the equipment. Flowsheet Row Cardiac Rehab from 02/20/2021 in [facility] Cardiac and Pulmonary Rehab  Education need identified 10/24/20  Date 10/24/20  Educator [place]  Instruction Review Code 1-  Verbalizes Understanding      Education: Exercise Physiology & General Exercise Guidelines: - Group verbal and written instruction with models to review the exercise physiology of the cardiovascular system and associated critical values. Provides general exercise guidelines with specific guidelines to those with heart or lung disease.  Flowsheet Row Cardiac Rehab from 02/20/2021 in [facility] Cardiac and Pulmonary Rehab  Date 10/31/20  Educator [facility]  Instruction Review Code 1- Verbalizes Understanding      Education: Flexibility, Balance, Mind/Body Relaxation: - Group verbal and visual presentation with interactive activity on the components of exercise prescription. Introduces F.I.T.T principle from ACSM for exercise prescriptions. Reviews F.I.T.T. principles of flexibility and balance exercise training including progression. Also discusses the mind body connection.  Reviews various relaxation techniques to help reduce and manage stress (i.e. Deep breathing, progressive muscle relaxation, and visualization). Balance handout provided to take home. Written material given at graduation. Flowsheet Row Cardiac Rehab from 02/20/2021 in [facility] Cardiac and Pulmonary Rehab  Date 01/23/21  Educator AS  Instruction Review Code 1- Verbalizes Understanding      Activity Barriers & Risk Stratification:  Activity Barriers & Cardiac Risk Stratification - 10/24/20 1351      Activity Barriers & Cardiac Risk Stratification   Activity Barriers Arthritis;Deconditioning    Cardiac Risk Stratification High           6 Minute Walk:  6 Minute Walk    Row Name 10/24/20 1346 02/18/21 1113       6 Minute Walk   Phase Initial Discharge    Distance 1250 feet 1440 feet    Distance % Change -- 15.2 %    Distance Feet Change -- 190 ft    Walk Time 6 minutes 6 minutes    # of Rest Breaks 0 0    MPH 2.36 2.72    METS 2.33 3    RPE 13 14    Perceived Dyspnea  1 2    VO2 Peak 8.15 10.53    Symptoms Yes (comment) Yes  (comment)    Comments Hip pain 3/10 Hip pain 6/10, leg pain 5/10    Resting HR 69 bpm 70 bpm    Resting BP 118/62 138/70    Resting Oxygen Saturation  97 % 95 %    Exercise Oxygen Saturation  during 6 min walk 98 % 95 %    Max Ex. HR 97 bpm 110 bpm    Max Ex. BP 154/64 182/64    2 Minute Post BP 128/66 138/62           Oxygen Initial Assessment:   Oxygen Re-Evaluation:   Oxygen Discharge (Final Oxygen Re-Evaluation):   Initial Exercise Prescription:  Initial Exercise Prescription - 10/24/20 1300      Date of Initial Exercise RX and Referring Provider   Date 10/24/20    Referring Provider Paraschos, Alexander MD      Treadmill   MPH 1.7    Grade 0    Minutes 15    METs 2.3      Recumbant Bike   Level 2  RPM 60    Watts 15    Minutes 15    METs 2.3      NuStep   Level 2    SPM 80    Minutes 15    METs 2.3      T5 Nustep   Level 1    SPM 80    Minutes 15    METs 2.3      Biostep-RELP   Level 1    SPM 50    Minutes 15    METs 2.3      Prescription Details   Frequency (times per week) 2    Duration Progress to 30 minutes of continuous aerobic without signs/symptoms of physical distress      Intensity   THRR 40-80% of Max Heartrate 106-144    Ratings of Perceived Exertion 11-13    Perceived Dyspnea 0-4      Progression   Progression Continue to progress workloads to maintain intensity without signs/symptoms of physical distress.      Resistance Training   Training Prescription Yes    Weight 3 lb    Reps 10-15           Perform Capillary Blood Glucose checks as needed.  Exercise Prescription Changes:   Exercise Prescription Changes    Row Name 10/24/20 1300 11/04/20 1600 11/14/20 1000 11/19/20 1500 12/03/20 1300     Response to Exercise   Blood Pressure (Admit) 118/62 142/70 -- 132/76 152/54   Blood Pressure (Exercise) 154/64 178/88 -- 154/78 146/68   Blood Pressure (Exit) 128/66 124/68 -- 122/74 138/64   Heart Rate (Admit) 69  bpm 88 bpm -- 73 bpm 82 bpm   Heart Rate (Exercise) 97 bpm 102 bpm -- 105 bpm 101 bpm   Heart Rate (Exit) 68 bpm 82 bpm -- 80 bpm 72 bpm   Oxygen Saturation (Admit) 97 % -- -- -- --   Oxygen Saturation (Exercise) 98 % -- -- -- --   Oxygen Saturation (Exit) 96 % -- -- -- --   Rating of Perceived Exertion (Exercise) 13 12 -- 13 14   Perceived Dyspnea (Exercise) 1 -- -- -- --   Symptoms hip pain 3/10 -- -- none none   Comments walk test results second day exercise -- -- --   Duration -- Continue with 30 min of aerobic exercise without signs/symptoms of physical distress. -- Continue with 30 min of aerobic exercise without signs/symptoms of physical distress. Continue with 30 min of aerobic exercise without signs/symptoms of physical distress.   Intensity -- THRR unchanged -- THRR unchanged THRR unchanged     Progression   Progression -- Continue to progress workloads to maintain intensity without signs/symptoms of physical distress. -- Continue to progress workloads to maintain intensity without signs/symptoms of physical distress. Continue to progress workloads to maintain intensity without signs/symptoms of physical distress.   Average METs -- 2.48 -- 2.34 2.4     Resistance Training   Training Prescription -- Yes -- Yes Yes   Weight -- 3 lb -- 3 lb 3 lb   Reps -- 10-15 -- 10-15 10-15     Interval Training   Interval Training -- -- -- No No     Treadmill   MPH -- 2 -- 2.2 1.9   Grade -- 0 -- 0 --   Minutes -- 15 -- 15 15   METs -- 2.76 -- 2.68 2.45     Recumbant Bike   Level -- -- -- 2 --  Minutes -- -- -- 15 --   METs -- -- -- 2.6 --     NuStep   Level -- -- -- 2 --   Minutes -- -- -- 15 --   METs -- -- -- 2.2 --     T5 Nustep   Level -- 3 -- 3 3   SPM -- 80 -- -- --   Minutes -- 15 -- 15 15   METs -- 2.2 -- 1.9 2.1     Home Exercise Plan   Plans to continue exercise at -- -- Home (comment)  walk, staff videos, considering [place] (comment)  walk, staff  videos, considering [place] (comment)  walk, staff videos, considering [employer]   Frequency -- -- Add 3 additional days to program exercise sessions. Add 3 additional days to program exercise sessions. Add 3 additional days to program exercise sessions.   Initial Home Exercises Provided -- -- 11/14/20 11/14/20 11/14/20   Row Name 12/17/20 1500 12/30/20 1100 01/15/21 1600 01/28/21 1400 02/12/21 1500     Response to Exercise   Blood Pressure (Admit) 134/64 -- 126/62 130/62 120/64   Blood Pressure (Exercise) 144/86 152/78 132/76 126/68 140/66   Blood Pressure (Exit) 134/80 132/70 120/60 118/68 106/58   Heart Rate (Admit) 74 bpm 69 bpm 63 bpm 70 bpm 67 bpm   Heart Rate (Exercise) 101 bpm 89 bpm 88 bpm 79 bpm 94 bpm   Heart Rate (Exit) 80 bpm 66 bpm 72 bpm 68 bpm 75 bpm   Oxygen Saturation (Admit) -- -- -- -- 96 %   Oxygen Saturation (Exercise) -- -- -- -- 95 %   Oxygen Saturation (Exit) -- -- -- -- 94 %   Rating of Perceived Exertion (Exercise) 14 12 14 13 13    Perceived Dyspnea (Exercise) -- -- -- -- 0   Symptoms none none none none none   Duration Continue with 30 min of aerobic exercise without signs/symptoms of physical distress. Continue with 30 min of aerobic exercise without signs/symptoms of physical distress. Continue with 30 min of aerobic exercise without signs/symptoms of physical distress. Continue with 30 min of aerobic exercise without signs/symptoms of physical distress. Continue with 30 min of aerobic exercise without signs/symptoms of physical distress.   Intensity THRR unchanged THRR unchanged THRR unchanged THRR unchanged THRR unchanged     Progression   Progression Continue to progress workloads to maintain intensity without signs/symptoms of physical distress. Continue to progress workloads to maintain intensity without signs/symptoms of physical distress. Continue to progress workloads to maintain intensity without signs/symptoms of physical distress.  Continue to progress workloads to maintain intensity without signs/symptoms of physical distress. Continue to progress workloads to maintain intensity without signs/symptoms of physical distress.   Average METs 2.64 2.26 2.86 2.9 3.48     Resistance Training   Training Prescription Yes Yes Yes Yes Yes   Weight 4 lb 4 lb 4 lb 3 lb 3 lb   Reps 10-15 10-15 10-15 10-15 10-15     Interval Training   Interval Training No No No No No     Treadmill   MPH 2 1.7 2 -- 2.4   Grade -- 0 0.5 -- 1   Minutes 15 15 30  -- 15   METs 2.53 2.3 2.67 -- 3.17     NuStep   Level 3 -- -- 4 2   Minutes 15 -- -- 15 15   METs 2.6 -- -- 2.9 3.8     T5  Nustep   Level 5 4 -- -- --   Minutes 15 15 -- -- --   METs 2.8 2.1 -- -- --     Biostep-RELP   Level -- -- 3 -- --   Minutes -- -- 15 -- --   METs -- -- 3 -- --     Home Exercise Plan   Plans to continue exercise at Home (comment)  walk, staff videos, considering [place] (comment)  walk, staff videos, considering [place] (comment)  walk, staff videos, considering [place] (comment)  walk, staff videos, considering [place] (comment)  walk, staff videos, considering [employer]   Frequency Add 3 additional days to program exercise sessions. Add 3 additional days to program exercise sessions. Add 3 additional days to program exercise sessions. Add 3 additional days to program exercise sessions. Add 3 additional days to program exercise sessions.   Initial Home Exercises Provided 11/14/20 11/14/20 11/14/20 11/14/20 11/14/20          Exercise Comments:   Exercise Comments    Row Name 10/29/20 1017           Exercise Comments First full day of exercise!  Patient was oriented to gym and equipment including functions, settings, policies, and procedures.  Patient's individual exercise prescription and treatment plan were reviewed.  All starting workloads were established based on the results of the 6 minute  walk test done at initial orientation visit.  The plan for exercise progression was also introduced and progression will be customized based on patient's performance and goals.              Exercise Goals and Review:   Exercise Goals    Row Name 10/24/20 1351             Exercise Goals   Increase Physical Activity Yes       Intervention Provide advice, education, support and counseling about physical activity/exercise needs.;Develop an individualized exercise prescription for aerobic and resistive training based on initial evaluation findings, risk stratification, comorbidities and participant's personal goals.       Expected Outcomes Short Term: Attend rehab on a regular basis to increase amount of physical activity.;Long Term: Add in home exercise to make exercise part of routine and to increase amount of physical activity.;Long Term: Exercising regularly at least 3-5 days a week.       Increase Strength and Stamina Yes       Intervention Provide advice, education, support and counseling about physical activity/exercise needs.;Develop an individualized exercise prescription for aerobic and resistive training based on initial evaluation findings, risk stratification, comorbidities and participant's personal goals.       Expected Outcomes Short Term: Increase workloads from initial exercise prescription for resistance, speed, and METs.;Short Term: Perform resistance training exercises routinely during rehab and add in resistance training at home;Long Term: Improve cardiorespiratory fitness, muscular endurance and strength as measured by increased METs and functional capacity (6MWT)       Able to understand and use rate of perceived exertion (RPE) scale Yes       Intervention Provide education and explanation on how to use RPE scale       Expected Outcomes Short Term: Able to use RPE daily in rehab to express subjective intensity level;Long Term:  Able to use RPE to guide intensity level when  exercising independently       Able to understand and use Dyspnea scale Yes  Intervention Provide education and explanation on how to use Dyspnea scale       Expected Outcomes Short Term: Able to use Dyspnea scale daily in rehab to express subjective sense of shortness of breath during exertion;Long Term: Able to use Dyspnea scale to guide intensity level when exercising independently       Knowledge and understanding of Target Heart Rate Range (THRR) Yes       Intervention Provide education and explanation of THRR including how the numbers were predicted and where they are located for reference       Expected Outcomes Short Term: Able to state/look up THRR;Short Term: Able to use daily as guideline for intensity in rehab;Long Term: Able to use THRR to govern intensity when exercising independently       Able to check pulse independently Yes       Intervention Provide education and demonstration on how to check pulse in carotid and radial arteries.;Review the importance of being able to check your own pulse for safety during independent exercise       Expected Outcomes Short Term: Able to explain why pulse checking is important during independent exercise;Long Term: Able to check pulse independently and accurately       Understanding of Exercise Prescription Yes       Intervention Provide education, explanation, and written materials on patient's individual exercise prescription       Expected Outcomes Short Term: Able to explain program exercise prescription;Long Term: Able to explain home exercise prescription to exercise independently              Exercise Goals Re-Evaluation :  Exercise Goals Re-Evaluation    Row Name 10/29/20 1017 11/04/20 1616 11/14/20 1036 11/19/20 1525 12/03/20 1009     Exercise Goal Re-Evaluation   Exercise Goals Review Able to understand and use rate of perceived exertion (RPE) scale;Able to understand and use Dyspnea scale;Knowledge and understanding of Target  Heart Rate Range (THRR);Understanding of Exercise Prescription Increase Physical Activity;Increase Strength and Stamina Increase Physical Activity;Increase Strength and Stamina;Able to understand and use rate of perceived exertion (RPE) scale;Able to understand and use Dyspnea scale;Knowledge and understanding of Target Heart Rate Range (THRR);Able to check pulse independently;Understanding of Exercise Prescription Increase Physical Activity;Increase Strength and Stamina;Understanding of Exercise Prescription Increase Physical Activity;Increase Strength and Stamina   Comments Reviewed RPE and dyspnea scales, THR and program prescription with pt today.  Pt voiced understanding and was given a copy of goals to take home. Philip Cook has tolerated exercise well in his first sessions.  He has increased TM to 2 mph. We will continue to monitor progress. Reviewed home exercise with pt today.  Pt plans to walk and use staff videos for exercise.  He and his wife are also planning to join [employer].  Reviewed THR, pulse, RPE, sign and symptoms, pulse oximetery and when to call 911 or MD.  Also discussed weather considerations and indoor options.  Pt voiced understanding. Philip Cook is doing well in rehab.  He is now up to 2.2 mph on the treadmill.  We will continue to monitor his progress. Philip Cook is back today after getting clearance due to new onset afib. He started back at 1.9 mph today. We will monitor progress.   Expected Outcomes Short: Use RPE daily to regulate intensity. Long: Follow program prescription in THR. Short:  attend consistently Long:  improve overall MET level Short: Start to add in more exercise at home Long; Continue to exercise indpendently. Short: Increase level  on NuStep Long: Continue to improve stamina. Short:  get back to consistent attendance Long:  continue to build stamina   Row Name 12/17/20 1530 12/30/20 1130 01/15/21 1606 01/28/21 1000 02/12/21 1505     Exercise Goal Re-Evaluation   Exercise  Goals Review Increase Physical Activity;Increase Strength and Stamina;Understanding of Exercise Prescription Increase Physical Activity;Increase Strength and Stamina Increase Physical Activity;Increase Strength and Stamina;Understanding of Exercise Prescription Increase Physical Activity;Increase Strength and Stamina;Understanding of Exercise Prescription Increase Physical Activity;Increase Strength and Stamina;Understanding of Exercise Prescription   Comments Philip Cook is doing well in rehab.  He now up to level 5 on the NuStep.  We will continue to monitor his progress. Philip Cook attends consistently amd works a RPE 12.  He does not reach target HR range. Staff will review HR. Philip Cook is doing well in rehab.  He missed yesterday due to a doctor's appointment.  He has dropped down from his 1% grade to 0.5% so we will work with him to get back up.  We will continue to montior his progress. Philip Cook reports not doing much exercise at home. Pt plans to join the gym with his wife - his wifes medicare starts in June and will go to [employer], he is going to go to [employer] - he was doing that before COVID. Since he is quitting his job this weekend (2 weeks notice) - he will start going to the gym soon. Philip Cook has been doing well in rehab.  He is up to 2.4 mph on the treadmill.  We will continue to monitor his progress.   Expected Outcomes Short: Continue to bump up workloads Long: Continue to improve stamina Short:attempt reaching THR range Long: increase MET level Short: Add incline back to treadmill Long: Continue to improve stamina Short: start [employer] on off days (may start this week if his wife will go with him) Long: Continue to improve stamina Short: Continue to improve workloads Long: Continue to improve stamina          Discharge Exercise Prescription (Final Exercise Prescription Changes):  Exercise Prescription Changes - 02/12/21 1500      Response to Exercise   Blood Pressure (Admit) 120/64    Blood  Pressure (Exercise) 140/66    Blood Pressure (Exit) 106/58    Heart Rate (Admit) 67 bpm    Heart Rate (Exercise) 94 bpm    Heart Rate (Exit) 75 bpm    Oxygen Saturation (Admit) 96 %    Oxygen Saturation (Exercise) 95 %    Oxygen Saturation (Exit) 94 %    Rating of Perceived Exertion (Exercise) 13    Perceived Dyspnea (Exercise) 0    Symptoms none    Duration Continue with 30 min of aerobic exercise without signs/symptoms of physical distress.    Intensity THRR unchanged      Progression   Progression Continue to progress workloads to maintain intensity without signs/symptoms of physical distress.    Average METs 3.48      Resistance Training   Training Prescription Yes    Weight 3 lb    Reps 10-15      Interval Training   Interval Training No      Treadmill   MPH 2.4    Grade 1    Minutes 15    METs 3.17      NuStep   Level 2    Minutes 15    METs 3.8      Home Exercise Plan   Plans to continue  exercise at Home (comment)   walk, staff videos, considering [employer]   Frequency Add 3 additional days to program exercise sessions.    Initial Home Exercises Provided 11/14/20           Nutrition:  Target Goals: Understanding of nutrition guidelines, daily intake of sodium <1564m, cholesterol <2023m calories 30% from fat and 7% or less from saturated fats, daily to have 5 or more servings of fruits and vegetables.  Education: All About Nutrition: -Group instruction provided by verbal, written material, interactive activities, discussions, models, and posters to present general guidelines for heart healthy nutrition including fat, fiber, MyPlate, the role of sodium in heart healthy nutrition, utilization of the nutrition label, and utilization of this knowledge for meal planning. Follow up email sent as well. Written material given at graduation. [facility]  Date 01/30/21  [facility]  Review Code 1- Verbalizes Understanding      Biometrics:  Pre Biometrics - 10/24/20 1351      Pre Biometrics   Height 5' 10"  (1.778 m)    Weight 343 lb 1.6 oz (155.6 kg)    BMI (Calculated) 49.23    Single Leg Stand 18.7 seconds            Nutrition Therapy Plan and Nutrition Goals:  Nutrition Therapy & Goals - 11/05/20 0848      Nutrition Therapy   Diet Heart healthy, low Na, diabetes friendly    Drug/Food Interactions Statins/Certain Fruits    Protein (specify units) 120g    Fiber 30 grams    Whole Grain Foods 3 servings    Saturated Fats 12 max. grams    Fruits and Vegetables 8 servings/day    Sodium 1.5 grams      Personal Nutrition Goals   Nutrition Goal ST: check in with feelings before, during, and after eating. create list of foods he enjoys. Try to drink more water instead of diet soda. LT: feel in control around food, prepare more meals and have snacks on hand.    Comments He is on insulin. He feels he is very picky - he doesn't like fish. he doesn't care for boiled or baked chicken - texture. He enjoys [place]. he also likes diet mountain dew, but would like to drink more water even though he doesn't care for it. He has been trying to eat high protein. he counts his CHO. He only likes apples for fruit. He feels out of control with food. He feels that he is out of control with food and feels that he wants to make changes, but in the moment is hard. Discussed heart healthy eating and diabetes friendly eating.  Discussed mindful eating and eating more intuitively - leftovers are ok and its ok if meals aren't perfect. Asked him to provide list of foods he enjoys and is willing to try to help make a plan for him. Asked him to observe how he feels before, during, and after eating food and where he sits on the hunger scale to help address feeling out of control around food. His wife is depressed and wants him to sit on the couch with her which causes him to midlessly eat -  encouraged staying busy to avoid boredom and choosing lower calorie snacks when eating outside of hunger.      Intervention Plan   Intervention Prescribe, educate and counsel regarding individualized specific dietary modifications aiming towards targeted core components  such as weight, hypertension, lipid management, diabetes, heart failure and other comorbidities.;Nutrition handout(s) given to patient.    Expected Outcomes Short Term Goal: Understand basic principles of dietary content, such as calories, fat, sodium, cholesterol and nutrients.;Short Term Goal: A plan has been developed with personal nutrition goals set during dietitian appointment.;Long Term Goal: Adherence to prescribed nutrition plan.           Nutrition Assessments:  MEDIFICTS Score Key:  ?70 Need to make dietary changes   40-70 Heart Healthy Diet  ? 40 Therapeutic Level Cholesterol Diet  Flowsheet Row Cardiac Rehab from 02/20/2021 in [facility] Cardiac and Pulmonary Rehab  Picture Your Plate Total Score on Discharge 51     Picture Your Plate Scores:  <95 Unhealthy dietary pattern with much room for improvement.  41-50 Dietary pattern unlikely to meet recommendations for good health and room for improvement.  51-60 More healthful dietary pattern, with some room for improvement.   >60 Healthy dietary pattern, although there may be some specific behaviors that could be improved.    Nutrition Goals Re-Evaluation:  Nutrition Goals Re-Evaluation    [place] Name 12/03/20 1014 12/31/20 0950 01/28/21 0952         Goals   Current Weight 342 lb 9.6 oz (155.4 kg) -- --     Nutrition Goal His cardiologist recommends the DASH diet.  He is making a list of foods he is and isnt willing to try for the RD. Philip Cook has worked on his list for the RD - he will bring it in.  He has cut down on portion sizes and is buying less snacks to have at home.  He is also trying to drink more water. ST: meet with RD for meal ideas based off of food  he enjoys LT: follow MyPlate, manage BG     Comment -- -- Philip Cook brought in his food list (things he eats, foods he would be willing to try, and foods he does not eat). RD to review food list and meet with Philip Cook to discuss.     Expected Outcome Short: meet with RD Long: use diet to help manage risk factors successfully SHort: bring list to RD Long: work on heart healthy DM freindly diet ST: meet with RD for meal ideas based off of food he enjoys LT: follow MyPlate, manage BG            Nutrition Goals Discharge (Final Nutrition Goals Re-Evaluation):  Nutrition Goals Re-Evaluation - 01/28/21 1884      Goals   Nutrition Goal ST: meet with RD for meal ideas based off of food he enjoys LT: follow MyPlate, manage BG    Comment Philip Cook brought in his food list (things he eats, foods he would be willing to try, and foods he does not eat). RD to review food list and meet with Philip Cook to discuss.    Expected Outcome ST: meet with RD for meal ideas based off of food he enjoys LT: follow MyPlate, manage BG           Psychosocial: Target Goals: Acknowledge presence or absence of significant depression and/or stress, maximize coping skills, provide positive support system. Participant is able to verbalize types and ability to use techniques and skills needed for reducing stress and depression.   Education: Stress, Anxiety, and Depression - Group verbal and visual presentation to define topics covered.  Reviews how body is impacted by stress, anxiety, and depression.  Also discusses healthy ways to reduce stress and to  treat/manage anxiety and depression.  Written material given at graduation. Flowsheet Row Cardiac Rehab from 09/15/2017 in [facility] Cardiac and Pulmonary Rehab  Date 07/07/17  Educator [facility]  Instruction Review Code 1- [employer] Understanding      Education: Sleep Hygiene -Provides group verbal and written instruction about how sleep can affect your health.  Define sleep hygiene, discuss sleep  cycles and impact of sleep habits. Review good sleep hygiene tips.    Initial Review & Psychosocial Screening:  Initial Psych Review & Screening - 10/17/20 0916      Initial Review   Current issues with None Identified      Family Dynamics   Good Support System? Yes   Wife and children     Barriers   Psychosocial barriers to participate in program There are no identifiable barriers or psychosocial needs.      Screening Interventions   Interventions Encouraged to exercise    Expected Outcomes Short Term goal: Utilizing psychosocial counselor, staff and physician to assist with identification of specific Stressors or current issues interfering with healing process. Setting desired goal for each stressor or current issue identified.;Long Term Goal: Stressors or current issues are controlled or eliminated.;Short Term goal: Identification and review with participant of any Quality of Life or Depression concerns found by scoring the questionnaire.;Long Term goal: The participant improves quality of Life and PHQ9 Scores as seen by post scores and/or verbalization of changes           Quality of Life Scores:   Quality of Life - 02/20/21 1153      Quality of Life   Select Quality of Life      Quality of Life Scores   Health/Function Pre 14.9 %    Health/Function Post 16.07 %    Health/Function % Change 7.85 %    Socioeconomic Pre 22.5 %    Socioeconomic Post 20.36 %    Socioeconomic % Change  -9.51 %    Psych/Spiritual Pre 23.43 %    Psych/Spiritual Post 21.07 %    Psych/Spiritual % Change -10.07 %    Family Pre 21.6 %    Family Post 20.4 %    Family % Change -5.56 %    GLOBAL Pre 19.21 %    GLOBAL Post 18.62 %    GLOBAL % Change -3.07 %          Scores of 19 and below usually indicate a poorer quality of life in these areas.  A difference of  2-3 points is a clinically meaningful difference.  A difference of 2-3 points in the total score of the Quality of Life Index has been  associated with significant improvement in overall quality of life, self-image, physical symptoms, and general health in studies assessing change in quality of life.  PHQ-9: Recent Review Flowsheet Data    Depression screen [facility] 2/9 02/20/2021 10/24/2020 12/28/2017 12/28/2017 09/28/2017   Decreased Interest 1 1 0 0 0   Down, Depressed, Hopeless 0 0 0 0 0   PHQ - 2 Score 1 1 0 0 0   Altered sleeping 1 0 - - -   Tired, decreased energy 2 1 - - -   Change in appetite 2 2 - - -   Feeling bad or failure about yourself  1 0 - - -   Trouble concentrating 0 0 - - -   Moving slowly or fidgety/restless 0 0 - - -   Suicidal thoughts 0 0 - - -  PHQ-9 Score 7 4 - - -   Difficult doing work/chores Somewhat difficult Somewhat difficult - - -     Interpretation of Total Score  Total Score Depression Severity:  1-4 = Minimal depression, 5-9 = Mild depression, 10-14 = Moderate depression, 15-19 = Moderately severe depression, 20-27 = Severe depression   Psychosocial Evaluation and Intervention:  Psychosocial Evaluation - 10/17/20 0937      Psychosocial Evaluation & Interventions   Comments Philip Cook has no barriers to attending the program. He has been through the program before and has had several life changes since last attendance. Philip Cook is retired and his wife is on disability for medical reason. He states that he does not have any more stress than the usual daily life stress. He lives with his wife and 2 teenage daughters, and male dog and cat. House full of women per Philip Cook.  He plans to look for a part time job in the future for something to do. He has not been able to lose weight and is talking about how to manage weight loss. He is looking forward to attending the program.    Expected Outcomes STG Attend all scheduled sessions, meet with RD to learn steps toward weight loss LTG: COntinue to manage weight with the tools and resources provided during program, continue to work toward healty lifestyle and stress free  life.    Continue Psychosocial Services  Follow up required by staff           Psychosocial Re-Evaluation:  Psychosocial Re-Evaluation    [place] Name 12/03/20 1005 12/31/20 0952 12/31/20 0957 01/28/21 1000       Psychosocial Re-Evaluation   Current issues with Current Stress Concerns;Current Sleep Concerns Current Stress Concerns;Current Sleep Concerns -- Current Stress Concerns;Current Sleep Concerns    Comments Philip Cook says he sleeps like a rock with CPAP.  He and his daughter have both been to ER in the past week and that has caused a little stress.  He likes to do wood work to help with stress.  He also takes breaks from working to help reduce stress. Philip Cook stil has some stress ealted to his 81 year old daughter.  Some days stress is better.  He sits in the yard with his dogs to help with stress and is also working some which he says helps with stress. Marcs wife has NASH diease and that also causes some stress.  He does feel he can cope well most days. Philip Cook's wife has NASH diease and that causes some stress. He rpeorts stress from new dogs and children right now. He would like to get away - may go on vacation after his 2 week notice with his job and may see his sister who he hasn't seen since Thanksgiving. His daughters are 57 and 35 and he doesn't feel like he can leave them alone. He has fluid retention from not being able to take his pills due to work - putting in two week notice. He plans on joining [employer] for his off days. He reports doing ok with his stress; he will go outside and sit on his deck and play with the dogs and listen to radios. He thinks he has been sleeping ok - but his wife says he is restless - he has a sleep study July 3rd. He continues wearing his CPAP.    Expected Outcomes Short: attend stress management education Long: manage stress long term Short: continue to use stress and playing with dogs to manage stress  Long:  manage stress long term -- Short: continue to play  with dogs to manage stress, join [employer] Long: manage stress long term    Interventions -- -- -- Encouraged to attend Cardiac Rehabilitation for the exercise    Continue Psychosocial Services  -- -- -- Follow up required by staff         Initial Review   Source of Stress Concerns -- -- -- Occupation;Family           Psychosocial Discharge (Final Psychosocial Re-Evaluation):  Psychosocial Re-Evaluation - 01/28/21 1000      Psychosocial Re-Evaluation   Current issues with Current Stress Concerns;Current Sleep Concerns    Comments Philip Cook's wife has NASH diease and that causes some stress. He rpeorts stress from new dogs and children right now. He would like to get away - may go on vacation after his 2 week notice with his job and may see his sister who he hasn't seen since Thanksgiving. His daughters are 71 and 54 and he doesn't feel like he can leave them alone. He has fluid retention from not being able to take his pills due to work - putting in two week notice. He plans on joining [employer] for his off days. He reports doing ok with his stress; he will go outside and sit on his deck and play with the dogs and listen to radios. He thinks he has been sleeping ok - but his wife says he is restless - he has a sleep study July 3rd. He continues wearing his CPAP.    Expected Outcomes Short: continue to play with dogs to manage stress, join [employer] Long: manage stress long term    Interventions Encouraged to attend Cardiac Rehabilitation for the exercise    Continue Psychosocial Services  Follow up required by staff      Initial Review   Source of Stress Concerns Occupation;Family           Vocational Rehabilitation: Provide vocational rehab assistance to qualifying candidates.   Vocational Rehab Evaluation & Intervention:  Vocational Rehab - 10/17/20 0919      Initial Vocational Rehab Evaluation & Intervention   Assessment shows need for Vocational Rehabilitation No            Education: Education Goals: Education classes will be provided on a variety of topics geared toward better understanding of heart health and risk factor modification. Participant will state understanding/return demonstration of topics presented as noted by education test scores.  Learning Barriers/Preferences:  Learning Barriers/Preferences - 10/17/20 0919      Learning Barriers/Preferences   Learning Barriers None    Learning Preferences None           General Cardiac Education Topics:  AED/CPR: - Group verbal and written instruction with the use of models to demonstrate the basic use of the AED with the basic ABC's of resuscitation.   Anatomy and Cardiac Procedures: - Group verbal and visual presentation and models provide information about basic cardiac anatomy and function. Reviews the testing methods done to diagnose heart disease and the outcomes of the test results. Describes the treatment choices: Medical Management, Angioplasty, or Coronary Bypass Surgery for treating various heart conditions including Myocardial Infarction, Angina, Valve Disease, and Cardiac Arrhythmias.  Written material given at graduation. Flowsheet Row Cardiac Rehab from 09/15/2017 in [facility] Cardiac and Pulmonary Rehab  Date 06/07/17  Educator [facility]  Instruction Review Code 1- Verbalizes Understanding      Medication Safety: - Group verbal  and visual instruction to review commonly prescribed medications for heart and lung disease. Reviews the medication, class of the drug, and side effects. Includes the steps to properly store meds and maintain the prescription regimen.  Written material given at graduation. Flowsheet Row Cardiac Rehab from 02/20/2021 in [facility] Cardiac and Pulmonary Rehab  Date 02/06/21  Educator [facility]  Instruction Review Code 1- Verbalizes Understanding      Intimacy: - Group verbal instruction through game format to discuss how heart and lung disease can affect sexual intimacy.  Written material given at graduation.. Flowsheet Row Cardiac Rehab from 02/20/2021 in [facility] Cardiac and Pulmonary Rehab  Date 11/07/20  Educator jh  Instruction Review Code 1- Verbalizes Understanding      Know Your Numbers and Heart Failure: - Group verbal and visual instruction to discuss disease risk factors for cardiac and pulmonary disease and treatment options.  Reviews associated critical values for Overweight/Obesity, Hypertension, Cholesterol, and Diabetes.  Discusses basics of heart failure: signs/symptoms and treatments.  Introduces Heart Failure Zone chart for action plan for heart failure.  Written material given at graduation. Flowsheet Row Cardiac Rehab from 02/20/2021 in [facility] Cardiac and Pulmonary Rehab  Date 02/13/21  Educator SB  Instruction Review Code 1- Verbalizes Understanding      Infection Prevention: - Provides verbal and written material to individual with discussion of infection control including proper hand washing and proper equipment cleaning during exercise session. Flowsheet Row Cardiac Rehab from 02/20/2021 in [facility] Cardiac and Pulmonary Rehab  Education need identified 10/24/20  Date 10/24/20  Educator [place]  Instruction Review Code 1- Verbalizes Understanding      Falls Prevention: - Provides verbal and written material to individual with discussion of falls prevention and safety. Flowsheet Row Cardiac Rehab from 02/20/2021 in [facility] Cardiac and Pulmonary Rehab  Education need identified 10/24/20  Date 10/24/20  Educator [place]  Instruction Review Code 1- Verbalizes Understanding      Other: -Provides group and verbal instruction on various topics (see comments)   Knowledge Questionnaire Score:  Knowledge Questionnaire Score - 02/20/21 1153      Knowledge Questionnaire Score   Post Score 25/26           Core Components/Risk Factors/Patient Goals at Admission:  Personal Goals and Risk Factors at Admission - 10/24/20 1352      Core Components/Risk  Factors/Patient Goals on Admission    Weight Management Yes;Obesity;Weight Loss    Intervention Weight Management: Develop a combined nutrition and exercise program designed to reach desired caloric intake, while maintaining appropriate intake of nutrient and fiber, sodium and fats, and appropriate energy expenditure required for the weight goal.;Weight Management: Provide education and appropriate resources to help participant work on and attain dietary goals.;Weight Management/Obesity: Establish reasonable short term and long term weight goals.;Obesity: Provide education and appropriate resources to help participant work on and attain dietary goals.    Admit Weight 343 lb (155.6 kg)    Goal Weight: Short Term 338 lb (153.3 kg)    Goal Weight: Long Term 323 lb (146.5 kg)    Expected Outcomes Short Term: Continue to assess and modify interventions until short term weight is achieved;Long Term: Adherence to nutrition and physical activity/exercise program aimed toward attainment of established weight goal;Weight Loss: Understanding of general recommendations for a balanced deficit meal plan, which promotes 1-2 lb weight loss per week and includes a negative energy balance of [phone] kcal/d    Diabetes Yes    Intervention Provide education about signs/symptoms and action  to take for hypo/hyperglycemia.;Provide education about proper nutrition, including hydration, and aerobic/resistive exercise prescription along with prescribed medications to achieve blood glucose in normal ranges: Fasting glucose 65-99 mg/dL    Expected Outcomes Short Term: Participant verbalizes understanding of the signs/symptoms and immediate care of hyper/hypoglycemia, proper foot care and importance of medication, aerobic/resistive exercise and nutrition plan for blood glucose control.;Long Term: Attainment of HbA1C < 7%.    Heart Failure Yes    Intervention Provide a combined exercise and nutrition program that is supplemented with  education, support and counseling about heart failure. Directed toward relieving symptoms such as shortness of breath, decreased exercise tolerance, and extremity edema.    Expected Outcomes Improve functional capacity of life;Short term: Attendance in program 2-3 days a week with increased exercise capacity. Reported lower sodium intake. Reported increased fruit and vegetable intake. Reports medication compliance.;Short term: Daily weights obtained and reported for increase. Utilizing diuretic protocols set by physician.;Long term: Adoption of self-care skills and reduction of barriers for early signs and symptoms recognition and intervention leading to self-care maintenance.    Hypertension Yes    Intervention Provide education on lifestyle modifcations including regular physical activity/exercise, weight management, moderate sodium restriction and increased consumption of fresh fruit, vegetables, and low fat dairy, alcohol moderation, and smoking cessation.;Monitor prescription use compliance.    Expected Outcomes Short Term: Continued assessment and intervention until BP is < 140/68m HG in hypertensive participants. < 130/863mHG in hypertensive participants with diabetes, heart failure or chronic kidney disease.;Long Term: Maintenance of blood pressure at goal levels.    Lipids Yes    Intervention Provide education and support for participant on nutrition & aerobic/resistive exercise along with prescribed medications to achieve LDL <7068mHDL >43m18m  Expected Outcomes Short Term: Participant states understanding of desired cholesterol values and is compliant with medications prescribed. Participant is following exercise prescription and nutrition guidelines.;Long Term: Cholesterol controlled with medications as prescribed, with individualized exercise RX and with personalized nutrition plan. Value goals: LDL < 70mg35mL > 40 mg.           Education:Diabetes - Individual verbal and written  instruction to review signs/symptoms of diabetes, desired ranges of glucose level fasting, after meals and with exercise. Acknowledge that pre and post exercise glucose checks will be done for 3 sessions at entry of program. [place] [facility]  Education need identified 10/24/20  Date 10/24/20  Educator [place]  [place] Review Code 1- Verbalizes Understanding      Core Components/Risk Factors/Patient Goals Review:   Goals and Risk Factor Review    Row Name 12/03/20 1000 12/31/20 0955 12/31/20 0958 01/28/21 0955       Core Components/Risk Factors/Patient Goals Review   Personal Goals Review Weight Management/Obesity;Improve shortness of breath with ADL's;Heart Failure;Diabetes Weight Management/Obesity;Improve shortness of breath with ADL's;Heart Failure;Diabetes -- Weight Management/Obesity;Improve shortness of breath with ADL's;Heart Failure;Diabetes    Review Philip Cook Philip Dillingow taking 5mg [id number] and Cardizem due to new onset of a fib.  He saw cardiologist yesterday.  He does check BP at home - it has been elevated at [id number] - it was 138 at696 yesterday.  Philip Cook sAltamese Dillinghis sugars have been good.  He started Flomax and he feels this has helped. Philip Cook iAltamese Dillingking meds as directed.  He doe smonitor BG and BP at home. Philip Cook iAltamese Dillingking meds as directed.  He does monitor BG and BP at home.  His  weight was down today since he has been working.  He would like to travel some but doesn't have anyone to watch his dogs and home. He feels his weight has been stable. Philip Cook is working with RD on diet. He continues to monitor his BG and BP at home - he has a continuous BG monitor (he also checks it manually 4x/day), he checks BP 1-2x/week. BG has been around 140, some highs and some lows as he feels he is not taking his insulin right - he is eating before he takes it, but reports doing better with insulin management. BP has been running 130-140/70; he has a  wrist cuff which he feels is not as accurate. Today BP was 130/62 at rehab. He feels his shortness of breath has been about the same - he continues to attend rehab. He reports having some edema in his lower extremities - he feels the part time job prevents him from taking fluid pills in afternoon - 3 doctors told him to quit his job - he is going to this weekend.    Expected Outcomes Short: continue to monitor BP/Bg at home  Long: manage risk factors long term -- Short:  continue to monitor vitals Long: manage risk factors long term Short:  continue to monitor vitals, work with RD, take fluid pills as prescribed Long: manage risk factors long term           Core Components/Risk Factors/Patient Goals at Discharge (Final Review):   Goals and Risk Factor Review - 01/28/21 0955      Core Components/Risk Factors/Patient Goals Review   Personal Goals Review Weight Management/Obesity;Improve shortness of breath with ADL's;Heart Failure;Diabetes    Review He feels his weight has been stable. Philip Cook is working with RD on diet. He continues to monitor his BG and BP at home - he has a continuous BG monitor (he also checks it manually 4x/day), he checks BP 1-2x/week. BG has been around 140, some highs and some lows as he feels he is not taking his insulin right - he is eating before he takes it, but reports doing better with insulin management. BP has been running 130-140/70; he has a wrist cuff which he feels is not as accurate. Today BP was 130/62 at rehab. He feels his shortness of breath has been about the same - he continues to attend rehab. He reports having some edema in his lower extremities - he feels the part time job prevents him from taking fluid pills in afternoon - 3 doctors told him to quit his job - he is going to this weekend.    Expected Outcomes Short:  continue to monitor vitals, work with RD, take fluid pills as prescribed Long: manage risk factors long term           ITP Comments:  ITP  Comments    Row Name 10/17/20 0933 10/24/20 0932 10/29/20 1017 11/13/20 0640 12/11/20 0750   ITP Comments Virtual orientation call completed today. he has an appointment on Date: 10/24/2020 for EP eval and gym Orientation.  Documentation of diagnosis can be found in [facility]  Date: 09/27/20 and 07/19/20. Completed 6MWT and gym orientation. Initial ITP created and sent for review to Dr. Emily Filbert, Medical Director. First full day of exercise!  Patient was oriented to gym and equipment including functions, settings, policies, and procedures.  Patient's individual exercise prescription and treatment plan were reviewed.  All starting workloads were established based on the results of the 6 minute walk  test done at initial orientation visit.  The plan for exercise progression was also introduced and progression will be customized based on patient's performance and goals. 30 Day review completed. Medical Director ITP review done, changes made as directed, and signed approval by Medical Director. 30 Day review completed. Medical Director ITP review done, changes made as directed, and signed approval by Medical Director. only 3 visits in March   Row Name 01/08/21 1003 02/05/21 0710 02/20/21 1044       ITP Comments 30 Day review completed. Medical Director ITP review done, changes made as directed, and signed approval by Medical Director. 30 Day review completed. Medical Director ITP review done, changes made as directed, and signed approval by Medical Director. Fahd graduated today from  rehab with 32  sessions completed.  Details of the patient's exercise prescription and what He needs to do in order to continue the prescription and progress were discussed with patient.  Patient was given a copy of prescription and goals.  Patient verbalized understanding.  Shaydon plans to continue to exercise by walking.            Comments: Discharge ITP

## 2021-06-03 ENCOUNTER — Other Ambulatory Visit: Payer: Self-pay | Admitting: Physician Assistant

## 2021-06-03 ENCOUNTER — Ambulatory Visit
Admission: RE | Admit: 2021-06-03 | Discharge: 2021-06-03 | Disposition: A | Discharge status: Home / Self Care | Payer: BC Managed Care – PPO | Source: Ambulatory Visit | Attending: Physician Assistant | Admitting: Physician Assistant

## 2021-06-03 ENCOUNTER — Other Ambulatory Visit: Payer: Self-pay

## 2021-06-03 DIAGNOSIS — L03115 Cellulitis of right lower limb: Secondary | ICD-10-CM | POA: Insufficient documentation

## 2021-06-03 DIAGNOSIS — M7989 Other specified soft tissue disorders: Secondary | ICD-10-CM

## 2021-06-24 ENCOUNTER — Other Ambulatory Visit
Admission: RE | Admit: 2021-06-24 | Discharge: 2021-06-24 | Disposition: A | Discharge status: Home / Self Care | Payer: BC Managed Care – PPO | Source: Ambulatory Visit | Attending: Internal Medicine | Admitting: Internal Medicine

## 2021-06-24 ENCOUNTER — Other Ambulatory Visit: Payer: Self-pay

## 2021-06-24 DIAGNOSIS — Z20822 Contact with and (suspected) exposure to covid-19: Secondary | ICD-10-CM | POA: Insufficient documentation

## 2021-06-24 DIAGNOSIS — Z01812 Encounter for preprocedural laboratory examination: Secondary | ICD-10-CM | POA: Diagnosis present

## 2021-06-25 LAB — SARS CORONAVIRUS 2 (TAT 6-24 HRS): SARS Coronavirus 2: NEGATIVE

## 2021-06-26 ENCOUNTER — Ambulatory Visit: Payer: BC Managed Care – PPO | Attending: Neurology

## 2021-06-26 DIAGNOSIS — G4733 Obstructive sleep apnea (adult) (pediatric): Secondary | ICD-10-CM | POA: Diagnosis present

## 2021-06-26 DIAGNOSIS — Z9989 Dependence on other enabling machines and devices: Secondary | ICD-10-CM | POA: Diagnosis not present

## 2021-06-27 ENCOUNTER — Other Ambulatory Visit: Payer: Self-pay

## 2021-09-02 ENCOUNTER — Other Ambulatory Visit: Payer: BC Managed Care – PPO | Attending: Internal Medicine

## 2021-09-03 ENCOUNTER — Ambulatory Visit: Payer: BC Managed Care – PPO | Attending: Neurology

## 2021-09-04 ENCOUNTER — Other Ambulatory Visit: Payer: Self-pay

## 2021-12-17 ENCOUNTER — Ambulatory Visit: Payer: BC Managed Care – PPO | Attending: Neurology

## 2021-12-17 DIAGNOSIS — G4733 Obstructive sleep apnea (adult) (pediatric): Secondary | ICD-10-CM | POA: Insufficient documentation

## 2021-12-17 DIAGNOSIS — G4761 Periodic limb movement disorder: Secondary | ICD-10-CM | POA: Diagnosis not present

## 2022-07-02 ENCOUNTER — Other Ambulatory Visit: Payer: Self-pay

## 2022-07-02 DIAGNOSIS — R310 Gross hematuria: Secondary | ICD-10-CM

## 2022-07-08 ENCOUNTER — Ambulatory Visit
Admission: RE | Admit: 2022-07-08 | Discharge: 2022-07-08 | Disposition: A | Discharge status: Home / Self Care | Payer: BC Managed Care – PPO | Source: Ambulatory Visit | Attending: Internal Medicine | Admitting: Internal Medicine

## 2022-07-08 DIAGNOSIS — R310 Gross hematuria: Secondary | ICD-10-CM | POA: Insufficient documentation

## 2022-07-23 ENCOUNTER — Ambulatory Visit: Payer: BC Managed Care – PPO | Admitting: Urology

## 2022-07-23 ENCOUNTER — Encounter: Payer: Self-pay | Admitting: Urology

## 2022-07-23 VITALS — BP 154/74 | HR 71 | Ht 70.0 in | Wt 239.0 lb

## 2022-07-23 DIAGNOSIS — Z87442 Personal history of urinary calculi: Secondary | ICD-10-CM

## 2022-07-23 DIAGNOSIS — N200 Calculus of kidney: Secondary | ICD-10-CM

## 2022-07-23 DIAGNOSIS — R310 Gross hematuria: Secondary | ICD-10-CM | POA: Diagnosis not present

## 2022-07-23 NOTE — Patient Instructions (Signed)

## 2022-07-23 NOTE — Progress Notes (Signed)
   07/23/2022 2:52 PM   Philip Cook November 04, 1960 [id number]  Reason for visit: Gross hematuria, history of nephrolithiasis  HPI: 61 year old male with morbid obesity who I previously followed in 2021 for kidney stones requiring ureteroscopy on the right side.  He reports about a week of painless gross hematuria earlier this month.  I do not see that a urine culture was ever sent, but urinalysis showed significant microscopic hematuria but no evidence of infection.  He was treated with antibiotics.  A renal ultrasound was also performed which I personally viewed and interpreted and showed no evidence of stones or hydronephrosis, or bladder lesions.  He has had some intermittent left-sided flank pain as well as intermittent sensation of incomplete emptying earlier this week that has improved.  We discussed common possible etiologies of hematuria including BPH, malignancy, urolithiasis, medical renal disease, and idiopathic. Standard workup recommended by the AUA includes imaging with CT urogram to assess the upper tracts, and cystoscopy. Cytology is performed on patient's with gross hematuria to look for malignant cells in the urine.  CT urogram and cystoscopy to complete hematuria work-up Will give antibiotic(cipro) prior to cystoscopy with his history of UTI   Billey Co, MD  [facility] [address], [place], [place] [address] [phone]

## 2022-08-04 ENCOUNTER — Ambulatory Visit
Admission: RE | Admit: 2022-08-04 | Discharge: 2022-08-04 | Disposition: A | Discharge status: Home / Self Care | Payer: BC Managed Care – PPO | Source: Ambulatory Visit | Attending: Urology | Admitting: Urology

## 2022-08-04 DIAGNOSIS — R310 Gross hematuria: Secondary | ICD-10-CM | POA: Insufficient documentation

## 2022-08-04 MED ORDER — IOHEXOL 350 MG/ML SOLN
100.0000 mL | Freq: Once | INTRAVENOUS | Status: AC | PRN
  Administered 2022-08-04: 100 mL via INTRAVENOUS

## 2022-08-04 MED ORDER — IOHEXOL 300 MG/ML  SOLN: 125.0000 mL | Freq: Once | INTRAMUSCULAR | Status: DC | PRN

## 2022-08-06 ENCOUNTER — Ambulatory Visit: Payer: BC Managed Care – PPO | Admitting: Urology

## 2022-08-06 VITALS — BP 115/67 | HR 71 | Ht 70.0 in | Wt 352.0 lb

## 2022-08-06 DIAGNOSIS — R310 Gross hematuria: Secondary | ICD-10-CM | POA: Diagnosis not present

## 2022-08-06 MED ORDER — CIPROFLOXACIN HCL 500 MG PO TABS
500.0000 mg | ORAL_TABLET | Freq: Once | ORAL | Status: AC
Start: 2022-08-06 — End: 2022-08-06
  Administered 2022-08-06: 500 mg via ORAL

## 2022-08-06 NOTE — Addendum Note (Signed)
Addended by: Despina Hidden on: 08/06/2022 02:04 PM   Modules accepted: Orders

## 2022-08-06 NOTE — Addendum Note (Signed)
Addended by: Donalee Citrin on: 08/06/2022 03:56 PM   Modules accepted: Orders

## 2022-08-06 NOTE — Progress Notes (Signed)
Cystoscopy Procedure Note:  Indication: Gross hematuria  Cipro given for prophylaxis  After informed consent and discussion of the procedure and its risks, Philip Cook was positioned and prepped in the standard fashion. Cystoscopy was performed with a flexible cystoscope. The urethra, bladder neck and entire bladder was visualized in a standard fashion. The prostate was moderate in size. The ureteral orifices were visualized in their normal location and orientation.  On retroflexion some very mild subtle erythema at the prostate but no papillary lesions or suspicious findings.  Cytology sent.  Imaging: CT urogram with no stones, filling defects, or other urologic abnormalities  Findings: Normal cystoscopy  Assessment and Plan: Follow-up cytology, if negative can follow-up with urology as needed  Nickolas Madrid, MD 08/06/2022

## 2022-08-10 LAB — CYTOLOGY - NON PAP

## 2022-10-21 IMAGING — DX DG CHEST 1V PORT
2 series · 2 of 2 positions shown · non-contrast
Comparison: Prior chest radiographs 07/28/2017 and earlier.

CLINICAL DATA: Shortness of breath.

EXAM:
PORTABLE CHEST 1 VIEW

[chest ap (1 of 2)]
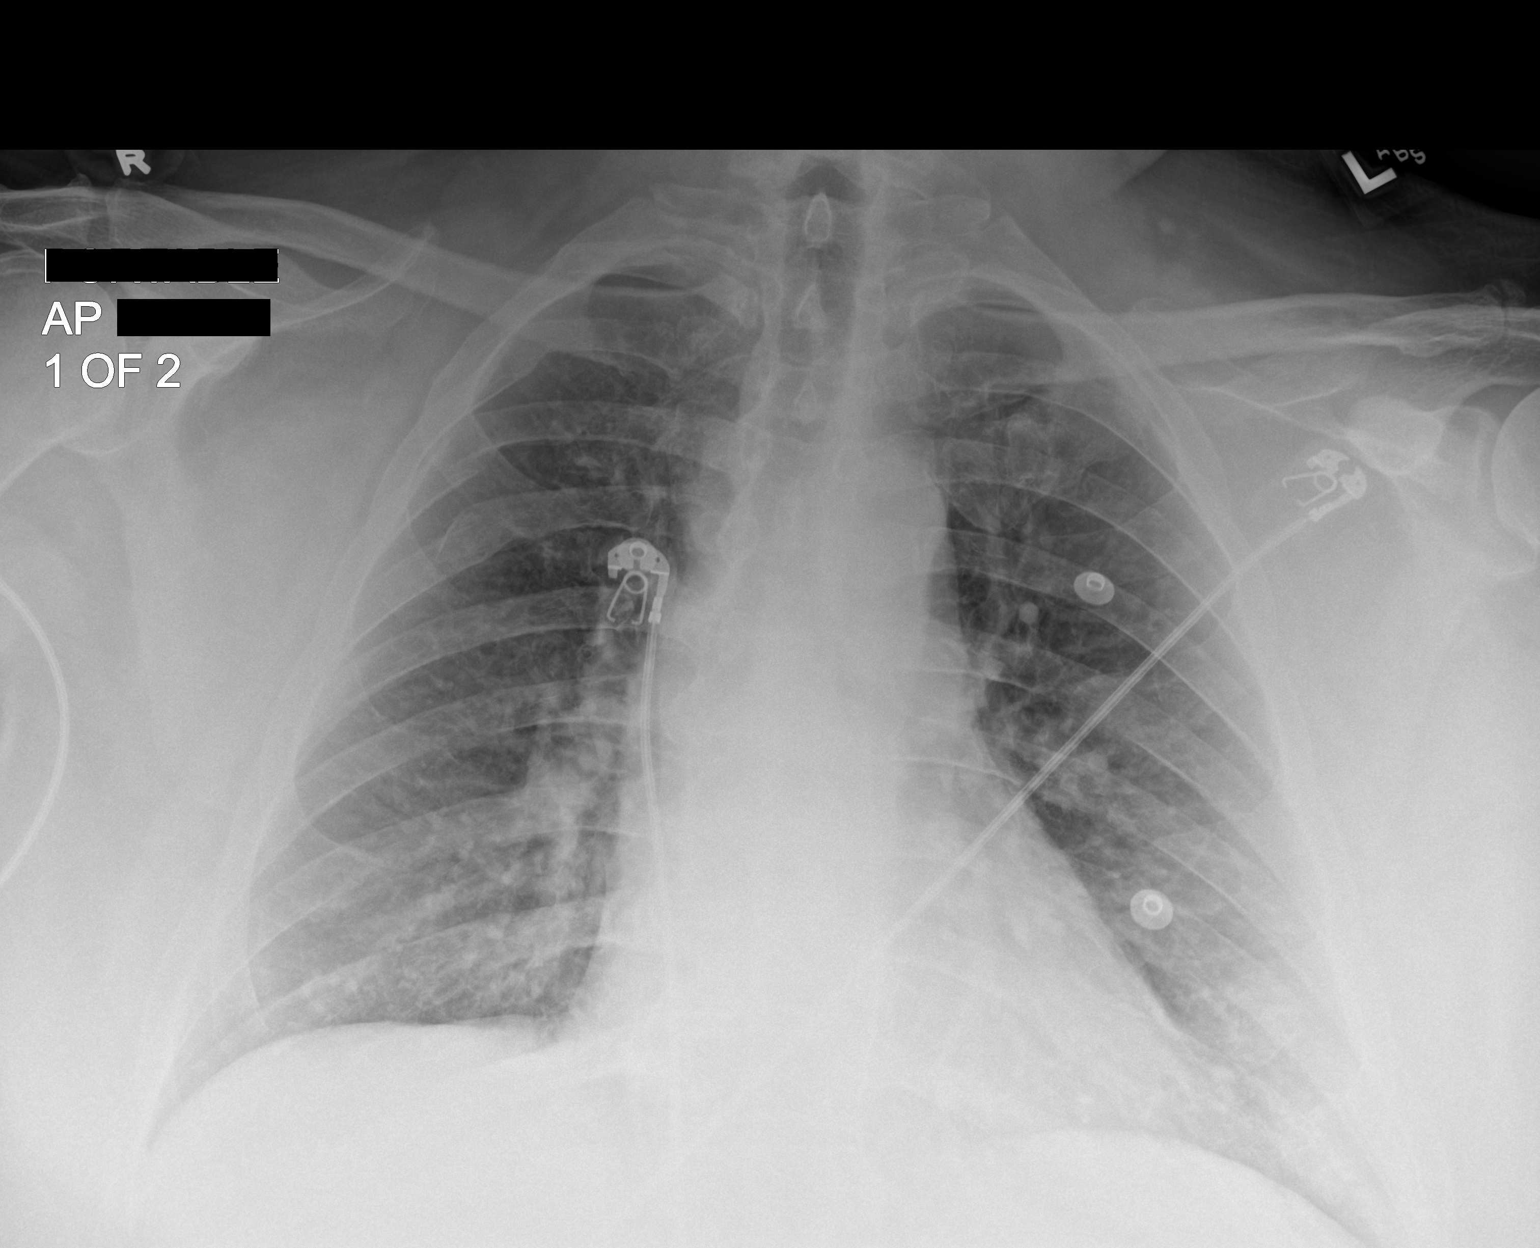

[chest ap (2 of 2)]
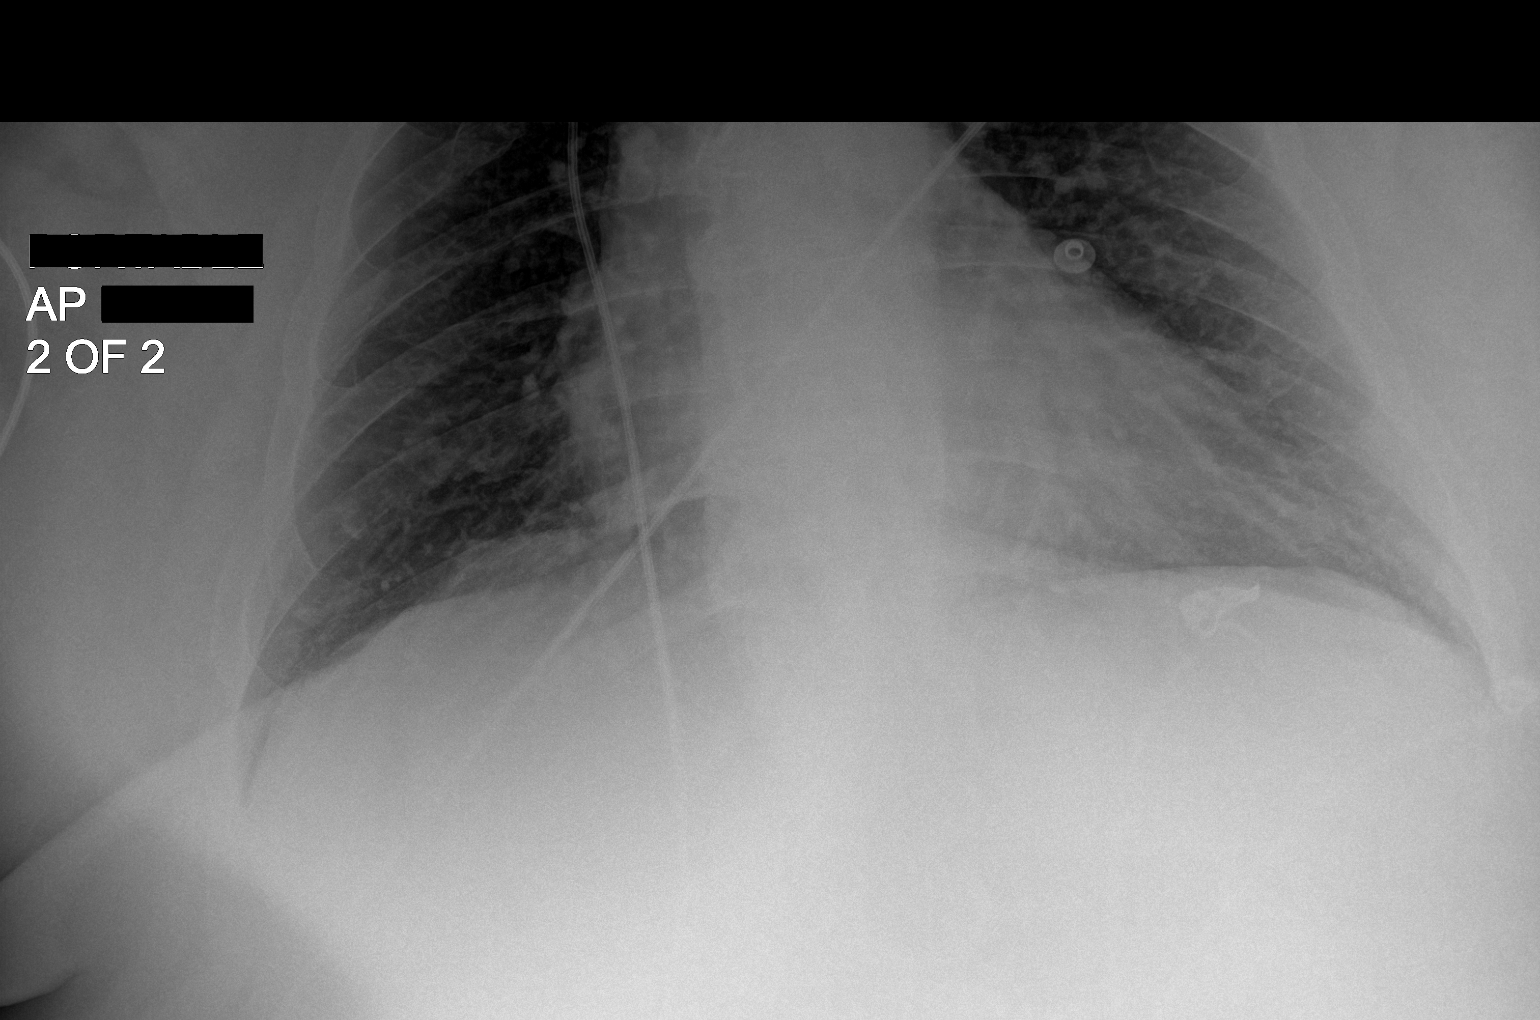

[2 of 2 positions shown; findings below may reference images not displayed]

FINDINGS: Heart size at the upper limits of normal. Mild interstitial
opacities within the bilateral lung bases. No evidence of pleural
effusion or pneumothorax. No acute bony abnormality identified.
IMPRESSION: Mild interstitial opacities within the bilateral lung bases may
reflect edema. Atypical/viral pneumonia cannot be excluded and
clinical correlation is recommended.

## 2023-07-07 IMAGING — US US EXTREM LOW VENOUS*R*
1 series · 13 of 24 positions shown · non-contrast
Comparison: Bilateral lower extremity venous Doppler
ultrasound-04/06/2017 (negative).

CLINICAL DATA: Right lower extremity pain and edema. Evaluate for
DVT.



[Series 1: us extrem low venous*right* · 0.07mm/px · 13 of 38 slices shown]
[im 1/38]
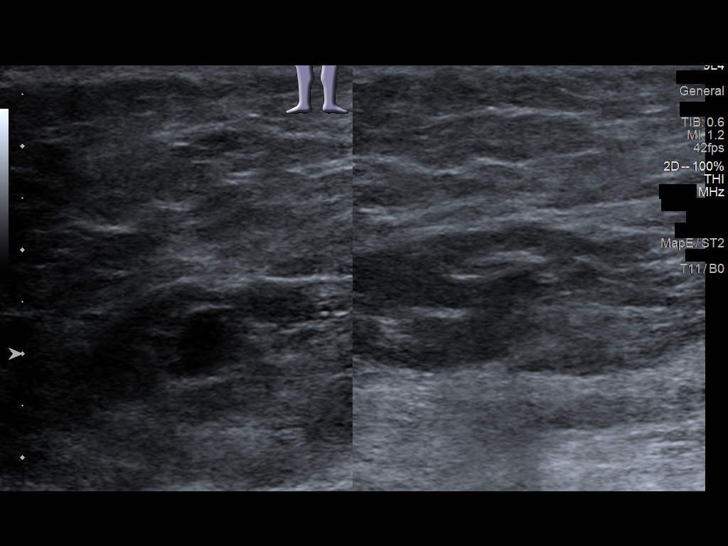
[im 4/38]
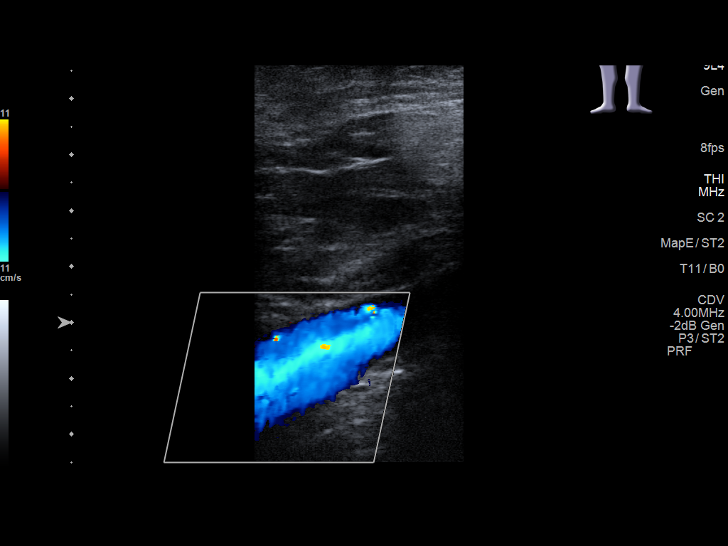
[im 7/38]
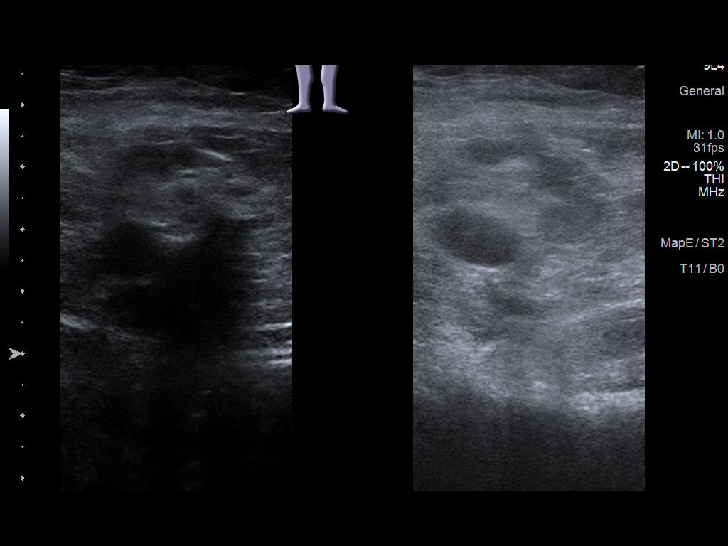
[im 10/38]
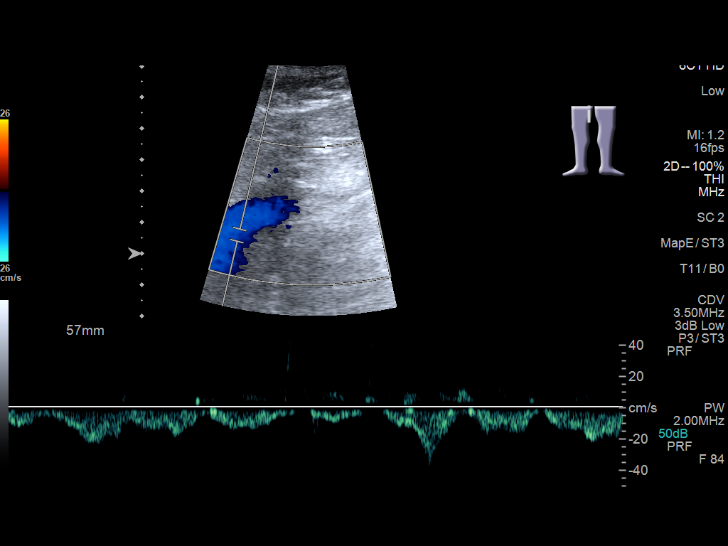
[im 13/38]
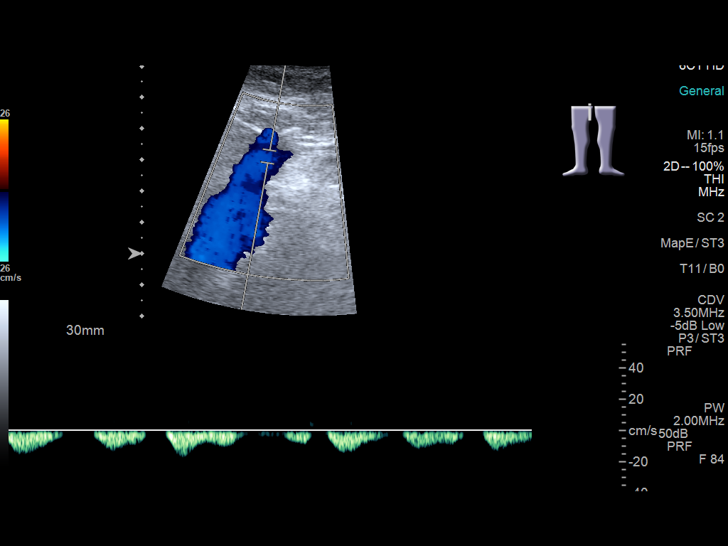
[im 17/38]
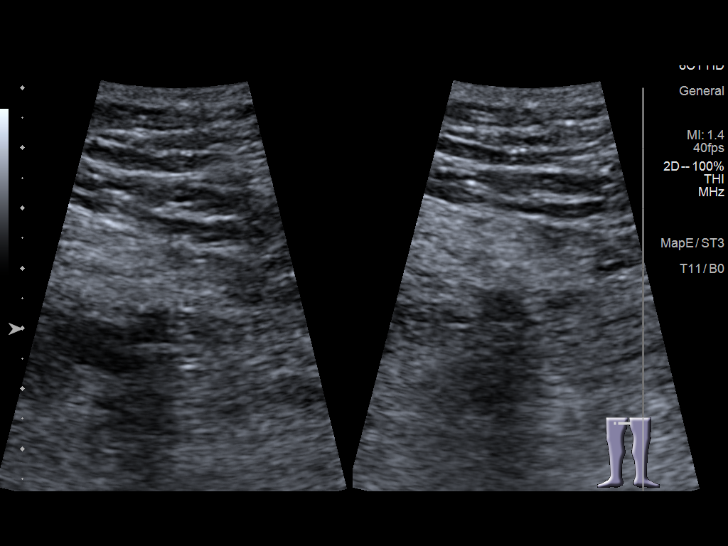
[im 20/38]
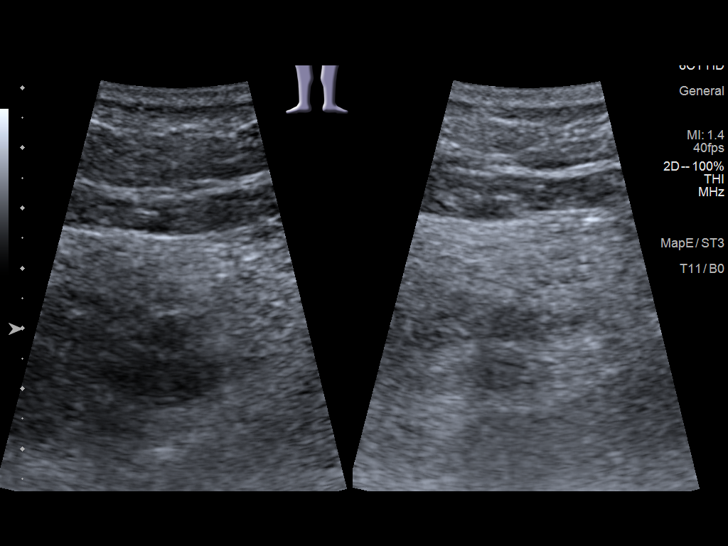
[im 21/38]
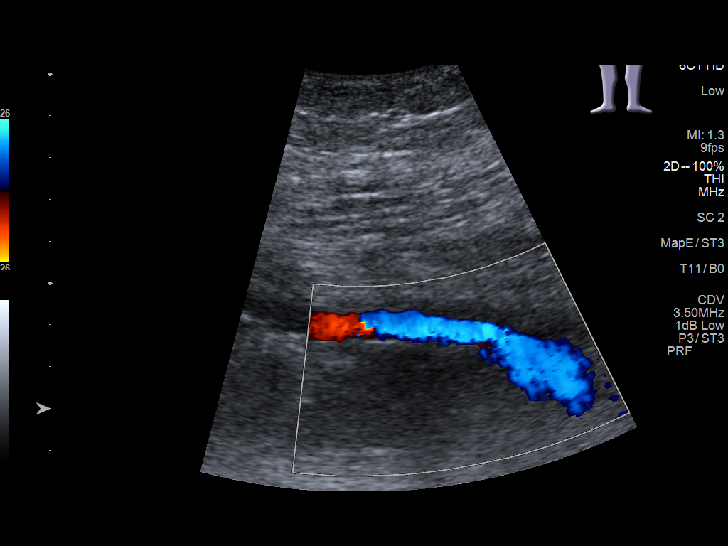
[im 25/38]
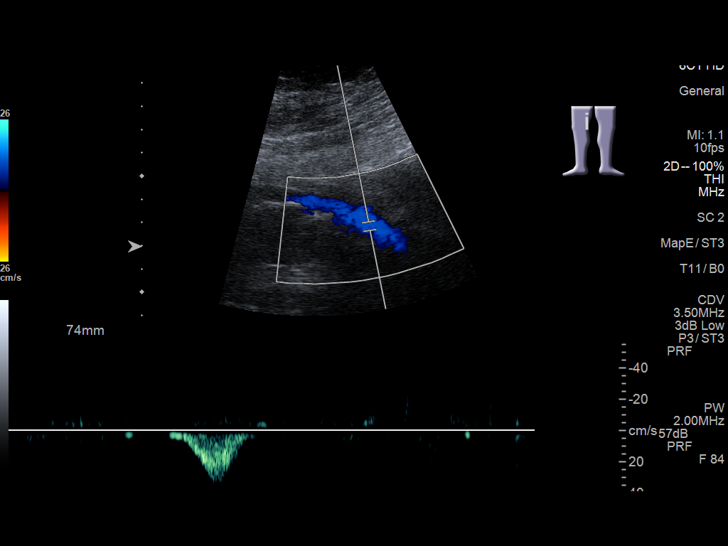
[im 28/38]
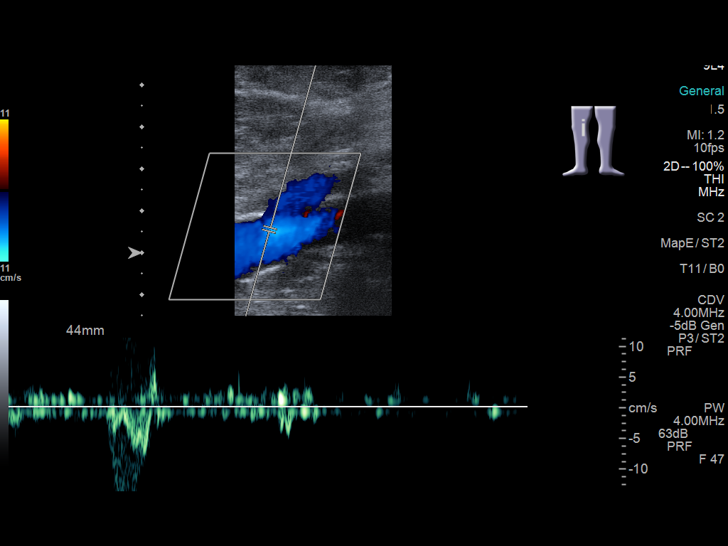
[im 31/38]
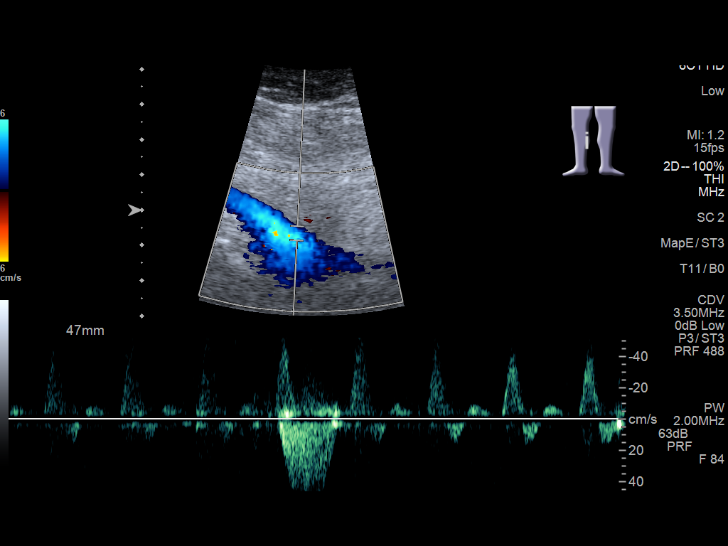
[im 34/38]
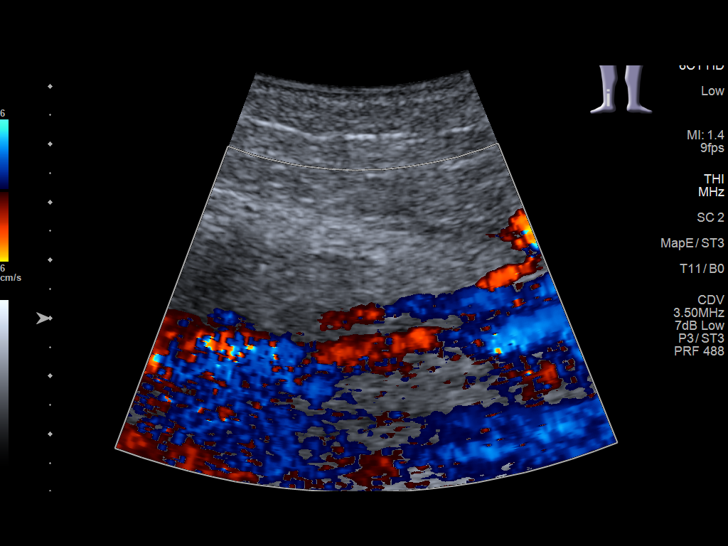
[im 38/38]
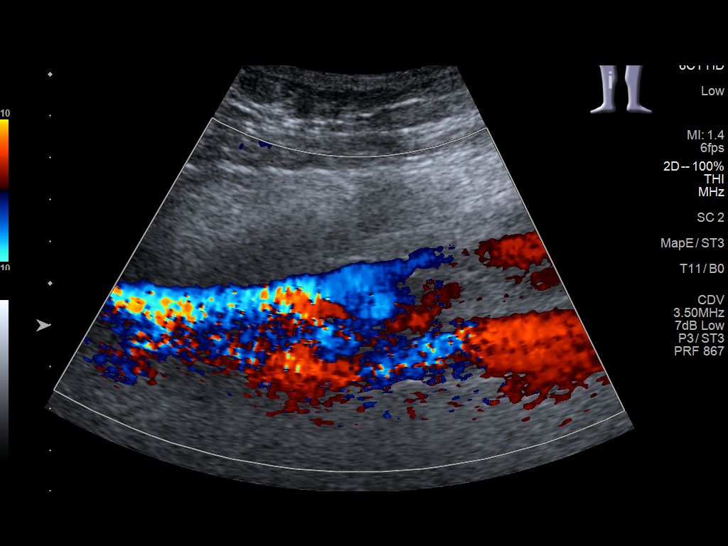

[13 of 24 positions shown; findings below may reference images not displayed]

FINDINGS: Contralateral Common Femoral Vein: Respiratory phasicity is normal
and symmetric with the symptomatic side. No evidence of thrombus.
Normal compressibility.

Common Femoral Vein: No evidence of thrombus. Normal
compressibility, respiratory phasicity and response to augmentation.

Saphenofemoral Junction: No evidence of thrombus. Normal
compressibility and flow on color Doppler imaging.

Profunda Femoral Vein: No evidence of thrombus. Normal
compressibility and flow on color Doppler imaging.

Femoral Vein: No evidence of thrombus. Normal compressibility,
respiratory phasicity and response to augmentation.

Popliteal Vein: No evidence of thrombus. Normal compressibility,
respiratory phasicity and response to augmentation.

Calf Veins: No evidence of thrombus. Normal compressibility and flow
on color Doppler imaging.

Superficial Great Saphenous Vein: No evidence of thrombus. Normal
compressibility.

Venous Reflux:  None.

Other Findings:  None.
IMPRESSION: No evidence of DVT within the right lower extremity.

## 2024-09-27 ENCOUNTER — Inpatient Hospital Stay
Admission: EM | Admit: 2024-09-27 | Discharge: 2024-09-28 | DRG: 439 | Disposition: A | Discharge status: Home / Self Care | Attending: Internal Medicine | Admitting: Internal Medicine

## 2024-09-27 ENCOUNTER — Other Ambulatory Visit: Payer: Self-pay

## 2024-09-27 ENCOUNTER — Emergency Department

## 2024-09-27 DIAGNOSIS — Z7982 Long term (current) use of aspirin: Secondary | ICD-10-CM | POA: Diagnosis not present

## 2024-09-27 DIAGNOSIS — Z8249 Family history of ischemic heart disease and other diseases of the circulatory system: Secondary | ICD-10-CM | POA: Diagnosis not present

## 2024-09-27 DIAGNOSIS — Z6841 Body Mass Index (BMI) 40.0 and over, adult: Secondary | ICD-10-CM

## 2024-09-27 DIAGNOSIS — Z794 Long term (current) use of insulin: Secondary | ICD-10-CM

## 2024-09-27 DIAGNOSIS — K859 Acute pancreatitis without necrosis or infection, unspecified: Secondary | ICD-10-CM | POA: Diagnosis present

## 2024-09-27 DIAGNOSIS — I2510 Atherosclerotic heart disease of native coronary artery without angina pectoris: Secondary | ICD-10-CM | POA: Diagnosis present

## 2024-09-27 DIAGNOSIS — Z882 Allergy status to sulfonamides status: Secondary | ICD-10-CM | POA: Diagnosis not present

## 2024-09-27 DIAGNOSIS — I110 Hypertensive heart disease with heart failure: Secondary | ICD-10-CM | POA: Diagnosis present

## 2024-09-27 DIAGNOSIS — N400 Enlarged prostate without lower urinary tract symptoms: Secondary | ICD-10-CM | POA: Diagnosis present

## 2024-09-27 DIAGNOSIS — K8500 Idiopathic acute pancreatitis without necrosis or infection: Secondary | ICD-10-CM | POA: Diagnosis present

## 2024-09-27 DIAGNOSIS — Z800 Family history of malignant neoplasm of digestive organs: Secondary | ICD-10-CM

## 2024-09-27 DIAGNOSIS — Z955 Presence of coronary angioplasty implant and graft: Secondary | ICD-10-CM | POA: Diagnosis not present

## 2024-09-27 DIAGNOSIS — I480 Paroxysmal atrial fibrillation: Secondary | ICD-10-CM | POA: Diagnosis present

## 2024-09-27 DIAGNOSIS — L03311 Cellulitis of abdominal wall: Secondary | ICD-10-CM | POA: Diagnosis present

## 2024-09-27 DIAGNOSIS — Z7901 Long term (current) use of anticoagulants: Secondary | ICD-10-CM

## 2024-09-27 DIAGNOSIS — Z87442 Personal history of urinary calculi: Secondary | ICD-10-CM

## 2024-09-27 DIAGNOSIS — K429 Umbilical hernia without obstruction or gangrene: Secondary | ICD-10-CM | POA: Diagnosis present

## 2024-09-27 DIAGNOSIS — Z9641 Presence of insulin pump (external) (internal): Secondary | ICD-10-CM | POA: Diagnosis present

## 2024-09-27 DIAGNOSIS — L0882 Omphalitis not of newborn: Secondary | ICD-10-CM

## 2024-09-27 DIAGNOSIS — Z8614 Personal history of Methicillin resistant Staphylococcus aureus infection: Secondary | ICD-10-CM | POA: Diagnosis not present

## 2024-09-27 DIAGNOSIS — Z7902 Long term (current) use of antithrombotics/antiplatelets: Secondary | ICD-10-CM | POA: Diagnosis not present

## 2024-09-27 DIAGNOSIS — E109 Type 1 diabetes mellitus without complications: Secondary | ICD-10-CM | POA: Diagnosis present

## 2024-09-27 DIAGNOSIS — Z79899 Other long term (current) drug therapy: Secondary | ICD-10-CM

## 2024-09-27 DIAGNOSIS — Z888 Allergy status to other drugs, medicaments and biological substances status: Secondary | ICD-10-CM

## 2024-09-27 DIAGNOSIS — E785 Hyperlipidemia, unspecified: Secondary | ICD-10-CM | POA: Diagnosis present

## 2024-09-27 DIAGNOSIS — Z7985 Long-term (current) use of injectable non-insulin antidiabetic drugs: Secondary | ICD-10-CM

## 2024-09-27 DIAGNOSIS — E66813 Obesity, class 3: Secondary | ICD-10-CM | POA: Diagnosis present

## 2024-09-27 LAB — GLUCOSE, CAPILLARY
Glucose-Capillary: 128 mg/dL — ABNORMAL HIGH (ref 70–99)
Glucose-Capillary: 128 mg/dL — ABNORMAL HIGH (ref 70–99)
Glucose-Capillary: 131 mg/dL — ABNORMAL HIGH (ref 70–99)
Glucose-Capillary: 169 mg/dL — ABNORMAL HIGH (ref 70–99)

## 2024-09-27 LAB — COMPREHENSIVE METABOLIC PANEL WITH GFR
ALT: 24 U/L (ref 0–44)
AST: 27 U/L (ref 15–41)
Albumin: 4.1 g/dL (ref 3.5–5.0)
Alkaline Phosphatase: 87 U/L (ref 38–126)
Anion gap: 12 (ref 5–15)
BUN: 14 mg/dL (ref 8–23)
CO2: 26 mmol/L (ref 22–32)
Calcium: 9.4 mg/dL (ref 8.9–10.3)
Chloride: 103 mmol/L (ref 98–111)
Creatinine, Ser: 0.91 mg/dL (ref 0.61–1.24)
GFR, Estimated: >60 mL/min
Glucose, Bld: 182 mg/dL — ABNORMAL HIGH (ref 70–99)
Potassium: 3.7 mmol/L (ref 3.5–5.1)
Sodium: 140 mmol/L (ref 135–145)
Total Bilirubin: 0.4 mg/dL (ref 0.0–1.2)
Total Protein: 7.1 g/dL (ref 6.5–8.1)

## 2024-09-27 LAB — CBC
HCT: 43.4 % (ref 39.0–52.0)
Hemoglobin: 14.1 g/dL (ref 13.0–17.0)
MCH: 30.5 pg (ref 26.0–34.0)
MCHC: 32.5 g/dL (ref 30.0–36.0)
MCV: 93.7 fL (ref 80.0–100.0)
Platelets: 179 K/uL (ref 150–400)
RBC: 4.63 MIL/uL (ref 4.22–5.81)
RDW: 12.7 % (ref 11.5–15.5)
WBC: 11.8 K/uL — ABNORMAL HIGH (ref 4.0–10.5)
nRBC: 0 % (ref 0.0–0.2)

## 2024-09-27 LAB — HEMOGLOBIN A1C
Hgb A1c MFr Bld: 6 % — ABNORMAL HIGH (ref 4.8–5.6)
Mean Plasma Glucose: 125.5 mg/dL

## 2024-09-27 LAB — URINALYSIS, ROUTINE W REFLEX MICROSCOPIC
Bacteria, UA: NONE SEEN
Bilirubin Urine: NEGATIVE
Glucose, UA: NEGATIVE mg/dL
Ketones, ur: NEGATIVE mg/dL
Leukocytes,Ua: NEGATIVE
Nitrite: NEGATIVE
Protein, ur: NEGATIVE mg/dL
Specific Gravity, Urine: 1.011 (ref 1.005–1.030)
pH: 7 (ref 5.0–8.0)

## 2024-09-27 LAB — LIPID PANEL
Cholesterol: 110 mg/dL (ref 0–200)
HDL: 26 mg/dL — ABNORMAL LOW
LDL Cholesterol: 25 mg/dL (ref 0–99)
Total CHOL/HDL Ratio: 4.2 ratio
Triglycerides: 293 mg/dL — ABNORMAL HIGH
VLDL: 59 mg/dL — ABNORMAL HIGH (ref 0–40)

## 2024-09-27 LAB — TROPONIN T, HIGH SENSITIVITY: Troponin T High Sensitivity: 20 ng/L — ABNORMAL HIGH (ref 0–19)

## 2024-09-27 LAB — LIPASE, BLOOD
Lipase: >2800 U/L — ABNORMAL HIGH (ref 11–51)
Lipase: >2800 U/L — ABNORMAL HIGH (ref 11–51)

## 2024-09-27 LAB — HIV ANTIBODY (ROUTINE TESTING W REFLEX): HIV Screen 4th Generation wRfx: NONREACTIVE

## 2024-09-27 MED ORDER — SODIUM CHLORIDE 0.9 % IV BOLUS (SEPSIS)
500.0000 mL | Freq: Once | INTRAVENOUS | Status: AC
  Administered 2024-09-27: 500 mL via INTRAVENOUS

## 2024-09-27 MED ORDER — CEFAZOLIN SODIUM-DEXTROSE 2-4 GM/100ML-% IV SOLN
2.0000 g | Freq: Three times a day (TID) | INTRAVENOUS | Status: DC
  Administered 2024-09-27 – 2024-09-28 (×3): 2 g via INTRAVENOUS
  Filled 2024-09-27 (×3): qty 100

## 2024-09-27 MED ORDER — IOHEXOL 350 MG/ML SOLN
100.0000 mL | Freq: Once | INTRAVENOUS | Status: AC | PRN
  Administered 2024-09-27: 125 mL via INTRAVENOUS

## 2024-09-27 MED ORDER — ONDANSETRON HCL 4 MG/2ML IJ SOLN
4.0000 mg | Freq: Once | INTRAMUSCULAR | Status: AC
  Administered 2024-09-27: 4 mg via INTRAVENOUS
  Filled 2024-09-27: qty 2

## 2024-09-27 MED ORDER — INSULIN ASPART 100 UNIT/ML IJ SOLN
0.0000 [IU] | Freq: Three times a day (TID) | INTRAMUSCULAR | Status: DC
  Administered 2024-09-27 (×3): 2 [IU] via SUBCUTANEOUS
  Administered 2024-09-28: 3 [IU] via SUBCUTANEOUS
  Administered 2024-09-28: 5 [IU] via SUBCUTANEOUS
  Filled 2024-09-27: qty 2
  Filled 2024-09-27: qty 5
  Filled 2024-09-27 (×2): qty 2
  Filled 2024-09-27: qty 3

## 2024-09-27 MED ORDER — SODIUM CHLORIDE 0.9 % IV SOLN: INTRAVENOUS | Status: DC

## 2024-09-27 MED ORDER — ACETAMINOPHEN 650 MG RE SUPP: 650.0000 mg | Freq: Four times a day (QID) | RECTAL | Status: DC | PRN

## 2024-09-27 MED ORDER — MORPHINE SULFATE (PF) 4 MG/ML IV SOLN
4.0000 mg | Freq: Once | INTRAVENOUS | Status: AC
  Administered 2024-09-27: 4 mg via INTRAVENOUS
  Filled 2024-09-27: qty 1

## 2024-09-27 MED ORDER — PHENTERMINE HCL 37.5 MG PO TABS: 37.5000 mg | ORAL_TABLET | Freq: Every morning | ORAL | Status: DC

## 2024-09-27 MED ORDER — INSULIN ASPART 100 UNIT/ML IJ SOLN: 0.0000 [IU] | Freq: Every day | INTRAMUSCULAR | Status: DC

## 2024-09-27 MED ORDER — NITROGLYCERIN 0.4 MG SL SUBL: 0.4000 mg | SUBLINGUAL_TABLET | SUBLINGUAL | Status: DC | PRN

## 2024-09-27 MED ORDER — ONDANSETRON HCL 4 MG/2ML IJ SOLN: 4.0000 mg | Freq: Four times a day (QID) | INTRAMUSCULAR | Status: DC | PRN

## 2024-09-27 MED ORDER — TAMSULOSIN HCL 0.4 MG PO CAPS
0.8000 mg | ORAL_CAPSULE | Freq: Every day | ORAL | Status: DC
  Administered 2024-09-27 – 2024-09-28 (×2): 0.8 mg via ORAL
  Filled 2024-09-27 (×2): qty 2

## 2024-09-27 MED ORDER — HYDROCODONE-ACETAMINOPHEN 5-325 MG PO TABS: 1.0000 | ORAL_TABLET | ORAL | Status: DC | PRN

## 2024-09-27 MED ORDER — ONDANSETRON HCL 4 MG/2ML IJ SOLN
4.0000 mg | Freq: Four times a day (QID) | INTRAMUSCULAR | Status: DC | PRN
  Administered 2024-09-27: 4 mg via INTRAVENOUS
  Filled 2024-09-27: qty 2

## 2024-09-27 MED ORDER — PRASUGREL HCL 10 MG PO TABS
10.0000 mg | ORAL_TABLET | Freq: Every day | ORAL | Status: DC
  Administered 2024-09-27: 10 mg via ORAL
  Filled 2024-09-27: qty 1

## 2024-09-27 MED ORDER — ENOXAPARIN SODIUM 80 MG/0.8ML IJ SOSY
70.0000 mg | PREFILLED_SYRINGE | INTRAMUSCULAR | Status: DC
  Filled 2024-09-27: qty 0.7

## 2024-09-27 MED ORDER — ONDANSETRON HCL 4 MG PO TABS: 4.0000 mg | ORAL_TABLET | Freq: Four times a day (QID) | ORAL | Status: DC | PRN

## 2024-09-27 MED ORDER — ONDANSETRON HCL 4 MG PO TABS
4.0000 mg | ORAL_TABLET | Freq: Four times a day (QID) | ORAL | Status: DC | PRN
  Filled 2024-09-27: qty 1

## 2024-09-27 MED ORDER — ACETAMINOPHEN 325 MG PO TABS: 650.0000 mg | ORAL_TABLET | Freq: Four times a day (QID) | ORAL | Status: DC | PRN

## 2024-09-27 MED ORDER — SODIUM CHLORIDE 0.9 % IV BOLUS (SEPSIS): 1000.0000 mL | Freq: Once | INTRAVENOUS | Status: DC

## 2024-09-27 MED ORDER — ACETAMINOPHEN 325 MG PO TABS
650.0000 mg | ORAL_TABLET | Freq: Four times a day (QID) | ORAL | Status: DC | PRN
  Administered 2024-09-27: 650 mg via ORAL
  Filled 2024-09-27: qty 2

## 2024-09-27 MED ORDER — LACTATED RINGERS IV SOLN: INTRAVENOUS | Status: DC

## 2024-09-27 MED ORDER — MORPHINE SULFATE (PF) 2 MG/ML IV SOLN
2.0000 mg | INTRAVENOUS | Status: DC | PRN
  Administered 2024-09-27: 2 mg via INTRAVENOUS
  Filled 2024-09-27: qty 1

## 2024-09-27 MED ORDER — NEBIVOLOL HCL 10 MG PO TABS
10.0000 mg | ORAL_TABLET | Freq: Every day | ORAL | Status: DC
  Administered 2024-09-27 – 2024-09-28 (×2): 10 mg via ORAL
  Filled 2024-09-27 (×2): qty 1

## 2024-09-27 MED ORDER — VANCOMYCIN HCL 2000 MG/400ML IV SOLN
2000.0000 mg | Freq: Once | INTRAVENOUS | Status: AC
  Administered 2024-09-27: 2000 mg via INTRAVENOUS
  Filled 2024-09-27: qty 400

## 2024-09-27 MED ORDER — DILTIAZEM HCL ER COATED BEADS 120 MG PO CP24
120.0000 mg | ORAL_CAPSULE | Freq: Every day | ORAL | Status: DC
  Administered 2024-09-27 – 2024-09-28 (×2): 120 mg via ORAL
  Filled 2024-09-27 (×2): qty 1

## 2024-09-27 MED ORDER — SODIUM CHLORIDE 0.9 % IV SOLN
2.0000 g | Freq: Once | INTRAVENOUS | Status: AC
  Administered 2024-09-27: 2 g via INTRAVENOUS
  Filled 2024-09-27 (×2): qty 20

## 2024-09-27 MED ORDER — ALBUTEROL SULFATE (2.5 MG/3ML) 0.083% IN NEBU: 2.5000 mg | INHALATION_SOLUTION | RESPIRATORY_TRACT | Status: DC | PRN

## 2024-09-27 MED ORDER — APIXABAN 5 MG PO TABS
5.0000 mg | ORAL_TABLET | Freq: Two times a day (BID) | ORAL | Status: DC
  Administered 2024-09-27 – 2024-09-28 (×3): 5 mg via ORAL
  Filled 2024-09-27 (×3): qty 1

## 2024-09-27 MED ORDER — HYDROMORPHONE HCL 1 MG/ML IJ SOLN
0.5000 mg | INTRAMUSCULAR | Status: DC | PRN
  Administered 2024-09-27: 1 mg via INTRAVENOUS
  Filled 2024-09-27: qty 1

## 2024-09-27 MED ORDER — ASPIRIN 81 MG PO TBEC
81.0000 mg | DELAYED_RELEASE_TABLET | Freq: Every evening | ORAL | Status: DC
  Administered 2024-09-27: 81 mg via ORAL
  Filled 2024-09-27: qty 1

## 2024-09-27 MED ORDER — OXYCODONE HCL 5 MG PO TABS: 5.0000 mg | ORAL_TABLET | ORAL | Status: DC | PRN

## 2024-09-27 NOTE — Progress Notes (Signed)
 ED Pharmacy Antibiotic Sign Off An antibiotic consult was received from an ED provider for Vancomycin  per pharmacy dosing for cellulitis. A chart review was completed to assess appropriateness.   The following one time order(s) were placed:  Vancomycin  2000 mg per pt wt: 138.8 kg from O/V note on 08/31/24  Further antibiotic and/or antibiotic pharmacy consults should be ordered by the admitting provider if indicated.   Thank you for allowing pharmacy to be a part of this patient's care.   Thank you, Rankin CANDIE Dills, PharmD, MBA 09/27/2024 4:00 AM

## 2024-09-27 NOTE — Inpatient Diabetes Management (Addendum)
 Inpatient Diabetes Program Recommendations  AACE/ADA: New Consensus Statement on Inpatient Glycemic Control (2015)  Target Ranges:  Prepandial:   less than 140 mg/dL      Peak postprandial:   less than 180 mg/dL (1-2 hours)      Critically ill patients:  140 - 180 mg/dL    Latest Reference Range & Units 09/27/24 08:21  Glucose-Capillary 70 - 99 mg/dL 871 (H)  (H): Data is abnormally high    Admit with: Pancreatitis  History: DM2  Home DM Meds: Insulin  Pump with Humulin R  U500 Concentrated Insulin         Mounjaro 15 mg Qweek  Current Orders: Novolog  Moderate Correction Scale/ SSI (0-15 units) TID AC + HS    Per Home Med Rec, Insulin  Pump turned off at 0300 this AM  12pm--Met w/ pt this AM.  Verified Insulin  Pump was Stopped and removed by pt at 3am today.  Pt does have all supplies needed to resume Insulin  pump if allowed to by MD.  Pt is alert and oriented and able to independently use insulin  pump if allowed.  Uses the Dexcom G7 in conjunction with his pump.  Knows how to suspend pump for Lows if needed.  Also told me his pump usually runs in auto mode (Control IQ mode) and can up regulate and down regulate insulin  rates based on glucose readings from his Dexcom.    Addendum 1:15pm--Secure Chat to Dr. Jhonny and RN regarding CBGs, insulin  pump.  CBG at 12pm was 128.  Discussion with MD to keep pt off his insulin  pump for now and resume pump if CBGs rise later today or tomorrow as pt has all pump supplies.  I provided pt with a Dexcom G7 sample if he needs to resume his pump later today.  Pt aware not to resume insulin  pump yet until MD orders.     ENDO: Dr. Damian Last Seen 08/31/2024 Tandem T:slim X2 Control IQ pump with a DexCom G6 continuous glucose monitor (CGM). He started this system in 02/2022.  Current pump settings: 12am Basal 1.25 units/hr Correction factor 28 Carb ratio 15 Target blood glucose 120 3:30am Basal 0.9 units/hr Correction factor 28 Carb ratio 15 Target  blood glucose 120 12:00pm Basal 0.75 units/hr Correction factor 28 Carb ratio 15 Target blood glucose 120 5 pm Basal 1.65 units/hr Correction factor 28 Carb ratio 15 Target blood glucose 120 24-hr basal is 27.325 units      --Will follow patient during hospitalization--  Adina Rudolpho Arrow RN, MSN, CDCES Diabetes Coordinator Inpatient Glycemic Control Team Team Pager: [phone] (8a-5p)

## 2024-09-27 NOTE — ED Triage Notes (Addendum)
 Pt arrives POV from home ambulatory to triage, gait steady, no acute distress noted c/o mid abd pain that radiates into bilateral flanks. pt states pain is moving to upper abd. Pt reports red colored discharge from umbilical area 2-3 days (pt has hernia in area but has not been bothered by it). Pt reports sob due to pain. Pain became severe in the past 3 hours. Pt reports constant ache but will have intermittent sharp pain on left side that radiates to umbilical area.

## 2024-09-27 NOTE — Progress Notes (Signed)
 PHARMACIST - PHYSICIAN COMMUNICATION  CONCERNING:  Enoxaparin  (Lovenox ) for DVT Prophylaxis    RECOMMENDATION: Patient was prescribed enoxaprin 40mg  q24 hours for VTE prophylaxis.   Filed Weights   09/27/24 0557  Weight: (!) 137.9 kg (304 lb)    Body mass index is 43.62 kg/m.  Estimated Creatinine Clearance: 119.4 mL/min (by C-G formula based on SCr of 0.91 mg/dL).   Based on [facility] policy patient is candidate for enoxaparin  0.5mg /kg TBW SQ every 24 hours based on BMI being >30.  DESCRIPTION: Pharmacy has adjusted enoxaparin  dose per [facility] policy.  Patient is now receiving enoxaparin  0.5 mg/kg every 24 hours   Rankin CANDIE Dills, PharmD, MBA 09/27/2024 6:00 AM

## 2024-09-27 NOTE — Plan of Care (Signed)

## 2024-09-27 NOTE — H&P (Signed)
 " History and Physical    Philip Cook FMW:[id number] DOB: 1960-11-09 DOA: 09/27/2024  PCP: Auston Reyes BIRCH, MD (Confirm with patient/family/NH records and if not entered, this has to be entered at [facility] point of entry) Patient coming from: Home  I have personally briefly reviewed patient's old medical records in [facility]  Chief Complaint: Abd pain  HPI: Philip Cook is a 64 y.o. male with medical history significant of atrial fibrillation on Eliquis , coronary artery disease on antiplatelet, congestive heart failure, hypertension, type 1 diabetes mellitus with insulin  pump, hyperlipidemia, obesity presents the ED with complaints of periumbilical abdominal pain with overlying skin changes with associated pain radiating to left and right flank.  No nausea vomiting or diarrhea.  CT imaging survey significant for a small fat-containing hiatal hernia.  Interestingly patient's lipase is greater than 28,000 with no CT evidence of acute pancreatitis.  On my evaluation patient is resting in bed.  He does appear slightly uncomfortable.  He is endorsing diffuse abdominal pain with radiation up into the epigastrium.  Belly nontender on examination.  ED Course: Patient received pain medication IV fluids.  Hospitalist contacted for admission.  Review of Systems: As per HPI otherwise 14 point review of systems negative.   Past Medical History:  Diagnosis Date   CHF (congestive heart failure) (HCC)    Coronary artery disease    Diabetes mellitus without complication (HCC)    History of kidney stones    Hx MRSA infection    Hyperlipidemia    Hypertension    Kidney stone    Sleep apnea     Past Surgical History:  Procedure Laterality Date   CARPAL TUNNEL RELEASE Bilateral    COLONOSCOPY WITH PROPOFOL  N/A 02/08/2020   Procedure: COLONOSCOPY WITH PROPOFOL ;  Surgeon: Toledo, Ladell POUR, MD;  Location: [facility] ENDOSCOPY;  Service: Gastroenterology;  Laterality: N/A;   CORONARY ANGIOPLASTY  WITH STENT PLACEMENT     CYSTOSCOPY W/ RETROGRADES  08/30/2020   Procedure: CYSTOSCOPY WITH RETROGRADE PYELOGRAM;  Surgeon: Francisca Redell BROCKS, MD;  Location: [facility] ORS;  Service: Urology;;   CYSTOSCOPY/URETEROSCOPY/HOLMIUM LASER/STENT PLACEMENT Right 08/30/2020   Procedure: CYSTOSCOPY/URETEROSCOPY/STENT PLACEMENT;  Surgeon: Francisca Redell BROCKS, MD;  Location: [facility] ORS;  Service: Urology;  Laterality: Right;   CYSTOSCOPY/URETEROSCOPY/HOLMIUM LASER/STENT PLACEMENT Right 09/09/2020   Procedure: CYSTOSCOPY/URETEROSCOPY/HOLMIUM LASER/STENT EXCHANGE;  Surgeon: Francisca Redell BROCKS, MD;  Location: [facility] ORS;  Service: Urology;  Laterality: Right;   LEFT HEART CATH AND CORONARY ANGIOGRAPHY N/A 04/07/2017   Procedure: Left Heart Cath and Coronary Angiography and possible PCI;  Surgeon: Florencio Cara BIRCH, MD;  Location: [facility] INVASIVE CV LAB;  Service: Cardiovascular;  Laterality: N/A;   ROTATOR CUFF REPAIR Left      reports that he has never smoked. He has never used smokeless tobacco. He reports that he does not drink alcohol and does not use drugs.  Allergies[1]  Family History  Problem Relation Age of Onset   CAD Father    Heart failure Brother      Prior to Admission medications  Medication Sig Start Date End Date Taking? Authorizing Provider  aspirin  EC 81 MG tablet Take 81 mg by mouth every evening. Swallow whole.   Yes [provider]  diltiazem  (CARDIZEM  CD) 120 MG 24 hr capsule Take 120 mg by mouth daily. 07/30/22  Yes [provider]  ELIQUIS  5 MG TABS tablet Take 5 mg by mouth 2 (two) times daily.   Yes [provider]  furosemide  (LASIX ) 20 MG tablet  Take 20 mg by mouth 2 (two) times daily.   Yes [provider]  HUMULIN R  500 UNIT/ML injection Inject into the skin. Use in insulin  pump 08/26/20  Yes [provider]  ibuprofen (ADVIL) 200 MG tablet Take 400-800 mg by mouth 2 (two) times daily as needed for moderate pain (pain score 4-6) or mild pain  (pain score 1-3).   Yes [provider]  Insulin  Human (INSULIN  PUMP) SOLN Inject into the skin.    Yes [provider]  MOUNJARO 15 MG/0.5ML Pen Inject 15 mg into the skin once a week. 06/28/24  Yes [provider]  Multiple Vitamin (MULTIVITAMIN WITH MINERALS) TABS tablet Take 1 tablet by mouth daily.   Yes [provider]  nebivolol  (BYSTOLIC ) 10 MG tablet Take 10 mg by mouth daily. 07/30/22  Yes [provider]  nitroGLYCERIN  (NITROSTAT ) 0.4 MG SL tablet Place 0.4 mg under the tongue every 5 (five) minutes as needed for chest pain. 08/09/23  Yes [provider]  phentermine  (ADIPEX-P ) 37.5 MG tablet Take 37.5 mg by mouth every morning.   Yes [provider]  prasugrel  (EFFIENT ) 10 MG TABS tablet Take 10 mg by mouth daily. 01/04/17  Yes [provider]  rosuvastatin  (CRESTOR ) 10 MG tablet Take 1 tablet (10 mg total) by mouth daily. 04/08/17  Yes Tobie Press, MD  tamsulosin  (FLOMAX ) 0.4 MG CAPS capsule Take 0.8 mg by mouth daily. 07/01/22  Yes [provider]  OZEMPIC, 0.25 OR 0.5 MG/DOSE, 2 MG/3ML SOPN Inject into the skin. Patient not taking: Reported on 09/27/2024 07/23/22   [provider]    Physical Exam: Vitals:   09/27/24 0600 09/27/24 0630 09/27/24 0657 09/27/24 0820  BP: 136/74 137/69  129/64  Pulse: 65 65  63  Resp: 14 (!) 22  17  Temp:   97.6 F (36.4 C) 97.7 F (36.5 C)  TempSrc:   Oral   SpO2: 99% 98%  95%  Weight:      Height:       General: No apparent distress, patient appears well HEENT: Normocephalic, atraumatic Neck, supple, trachea midline, no tenderness Heart: Regular rate and rhythm, S1/S2 normal, no murmurs Lungs: Clear to auscultation bilaterally, no adventitious sounds, normal work of breathing Abdomen: Obese, soft, nondistended, tender to palpation epigastrium and bilateral flanks Extremities: Normal, atraumatic, no clubbing or cyanosis, normal muscle tone Skin: No  rashes or lesions, normal color Neurologic: Cranial nerves grossly intact, sensation intact, alert and oriented x3 Psychiatric: Normal affect   Labs on Admission: I have personally reviewed following labs and imaging studies  CBC: Recent Labs  Lab 09/27/24 0049  WBC 11.8*  HGB 14.1  HCT 43.4  MCV 93.7  PLT 179   Basic Metabolic Panel: Recent Labs  Lab 09/27/24 0143  NA 140  K 3.7  CL 103  CO2 26  GLUCOSE 182*  BUN 14  CREATININE 0.91  CALCIUM  9.4   GFR: Estimated Creatinine Clearance: 119.4 mL/min (by C-G formula based on SCr of 0.91 mg/dL). Liver Function Tests: Recent Labs  Lab 09/27/24 0143  AST 27  ALT 24  ALKPHOS 87  BILITOT 0.4  PROT 7.1  ALBUMIN 4.1   Recent Labs  Lab 09/27/24 0143  LIPASE >2,800*   No results for input(s): AMMONIA in the last 168 hours. Coagulation Profile: No results for input(s): INR, PROTIME in the last 168 hours. Cardiac Enzymes: No results for input(s): CKTOTAL, CKMB, CKMBINDEX, TROPONINI in the last 168 hours. BNP (last 3  results) No results for input(s): PROBNP in the last 8760 hours. HbA1C: No results for input(s): HGBA1C in the last 72 hours. CBG: Recent Labs  Lab 09/27/24 0821  GLUCAP 128*   Lipid Profile: No results for input(s): CHOL, HDL, LDLCALC, TRIG, CHOLHDL, LDLDIRECT in the last 72 hours. Thyroid Function Tests: No results for input(s): TSH, T4TOTAL, FREET4, T3FREE, THYROIDAB in the last 72 hours. Anemia Panel: No results for input(s): VITAMINB12, FOLATE, FERRITIN, TIBC, IRON, RETICCTPCT in the last 72 hours. Urine analysis:    Component Value Date/Time   COLORURINE STRAW (A) 09/27/2024 0143   APPEARANCEUR CLEAR (A) 09/27/2024 0143   APPEARANCEUR Cloudy (A) 09/25/2020 1336   LABSPEC 1.011 09/27/2024 0143   PHURINE 7.0 09/27/2024 0143   GLUCOSEU NEGATIVE 09/27/2024 0143   HGBUR SMALL (A) 09/27/2024 0143   BILIRUBINUR NEGATIVE 09/27/2024 0143    BILIRUBINUR Negative 09/25/2020 1336   KETONESUR NEGATIVE 09/27/2024 0143   PROTEINUR NEGATIVE 09/27/2024 0143   NITRITE NEGATIVE 09/27/2024 0143   LEUKOCYTESUR NEGATIVE 09/27/2024 0143    Radiological Exams on Admission: CT ABDOMEN PELVIS W CONTRAST Result Date: 09/27/2024 EXAM: CT ABDOMEN AND PELVIS WITH CONTRAST 09/27/2024 03:20:40 AM TECHNIQUE: CT of the abdomen and pelvis was performed with the administration of intravenous contrast. Multiplanar reformatted images are provided for review. Automated exposure control, iterative reconstruction, and/or weight-based adjustment of the mA/kV was utilized to reduce the radiation dose to as low as reasonably achievable. COMPARISON: 08/04/2022. CLINICAL HISTORY: Generalized abdominal pain worse at the umbilicus, history of umbilical hernia, overlying skin changes, concern for incarcerated hernia versus abscess/cellulitis. FINDINGS: LOWER CHEST: Calcifications in the visualized right coronary artery. LIVER: The liver is unremarkable. GALLBLADDER AND BILE DUCTS: Gallbladder is unremarkable. No biliary ductal dilatation. SPLEEN: No acute abnormality. PANCREAS: No acute abnormality. ADRENAL GLANDS: No acute abnormality. KIDNEYS, URETERS AND BLADDER: Punctate nonobstructing stones in the right kidney. 2.2 cm simple appearing left mid pole renal cyst. Per consensus, no follow-up is needed for simple Bosniak type 1 and 2 renal cysts, unless the patient has a malignancy history or risk factors. No stones in the ureters. No hydronephrosis. No perinephric or periureteral stranding. Urinary bladder is unremarkable. GI AND BOWEL: Gaseous distention of the stomach. Left colonic diverticulosis. No active diverticulitis. There is no bowel obstruction. PERITONEUM AND RETROPERITONEUM: No ascites. No free air. VASCULATURE: Aorta is normal in caliber. aortic atherosclerosis. LYMPH NODES: No lymphadenopathy. REPRODUCTIVE ORGANS: No acute abnormality. BONES AND SOFT TISSUES: Small to  moderate sized umbilical hernia containing fat, stable since prior study. No acute osseous abnormality. No focal soft tissue abnormality. IMPRESSION: 1. Small to moderate sized fat-containing umbilical hernia, stable since prior study, without acute complication. 2. No acute abdominopelvic abnormality. 3. Coronary artery disease, aortic atherosclerosis. 4. Gaseous distention of the stomach. 5. Left colonic diverticulosis. Electronically signed by: Franky Crease MD 09/27/2024 03:42 AM EST RP Workstation: [id number]    EKG: Independently reviewed.  Sinus rhythm  Assessment/Plan Principal Problem:   Acute pancreatitis Active Problems:   Pancreatitis  Acute pancreatitis Etiology is unclear.  Patient is on Mounjaro which could be a precipitating factor.  Interestingly the patient's lipase is greater than 28,000 without any CT evidence of pancreatitis.  Triglycerides are 293 which is elevated but again not high suspicion as a primary cause of patient's pancreatitis. Plan: Pain control IV fluids Bowel rest Trend lipase  Abdominal wall cellulitis Possibly related to fat-containing umbilical hernia IV Ancef   CAD Resume DAPT  Paroxysmal atrial fibrillation Chronic anticoagulation Resume home AV nodal blocking  agents Resume Eliquis   Class III obesity BMI 43.62 Complicates overall care and prognosis  BPH Flomax   DVT prophylaxis: Eliquis  Code Status: Full Family Communication: None Disposition Plan: Dissipate return home environment within 1 to 2 days Consults called: None Admission status: Observation, MedSurg   Calvin KATHEE Robson MD [facility]   If 7PM-7AM, please contact night-coverage   09/27/2024, 9:23 AM       [1]  Allergies Allergen Reactions   Trulicity [Dulaglutide] Nausea And Vomiting   Sulfa Antibiotics Rash   "

## 2024-09-27 NOTE — ED Provider Notes (Signed)
 "  [facility] Provider Note    Event Date/Time   First MD Initiated Contact with Patient 09/27/24 0158     (approximate)   History   Abdominal Pain   HPI  Philip Cook is a 64 y.o. male with history of A-fib on Eliquis , CAD, CHF, hypertension, diabetes with insulin , hyperlipidemia, obesity, kidney stone who presents to the emergency department with complaints of umbilical abdominal pain with overlying skin changes (redness, warmth and bloody drainage) now pain radiating into the left side of his abdomen and flank.  No nausea, vomiting or diarrhea.  No dysuria or hematuria.  No prior abdominal surgeries.   History provided by patient and wife.    Past Medical History:  Diagnosis Date   CHF (congestive heart failure) (HCC)    Coronary artery disease    Diabetes mellitus without complication (HCC)    History of kidney stones    Hx MRSA infection    Hyperlipidemia    Hypertension    Kidney stone    Sleep apnea     Past Surgical History:  Procedure Laterality Date   CARPAL TUNNEL RELEASE Bilateral    COLONOSCOPY WITH PROPOFOL  N/A 02/08/2020   Procedure: COLONOSCOPY WITH PROPOFOL ;  Surgeon: Toledo, Ladell POUR, MD;  Location: [facility];  Service: Gastroenterology;  Laterality: N/A;   CORONARY ANGIOPLASTY WITH STENT PLACEMENT     CYSTOSCOPY W/ RETROGRADES  08/30/2020   Procedure: CYSTOSCOPY WITH RETROGRADE PYELOGRAM;  Surgeon: Francisca Redell BROCKS, MD;  Location: [facility] ORS;  Service: Urology;;   CYSTOSCOPY/URETEROSCOPY/HOLMIUM LASER/STENT PLACEMENT Right 08/30/2020   Procedure: CYSTOSCOPY/URETEROSCOPY/STENT PLACEMENT;  Surgeon: Francisca Redell BROCKS, MD;  Location: [facility] OR[facility];  Service: Urology;  Laterality: Right;   CYSTOSCOPY/URETEROSCOPY/HOLMIUM LASER/STENT PLACEMENT Right 09/09/2020   Procedure: CYSTOSCOPY/URETEROSCOPY/HOLMIUM LASER/STENT EXCHANGE;  Surgeon: Francisca Redell BROCKS, MD;  Location: [facility] ORS;  Service: Urology;  Laterality: Right;   LEFT HEART  CATH AND CORONARY ANGIOGRAPHY N/A 04/07/2017   Procedure: Left Heart Cath and Coronary Angiography and possible PCI;  Surgeon: Florencio Cara BIRCH, MD;  Location: [facility];  Service: Cardiovascular;  Laterality: N/A;   ROTATOR CUFF REPAIR Left     MEDICATIONS:  Prior to Admission medications  Medication Sig Start Date End Date Taking? Authorizing Provider  diltiazem  (CARDIZEM  CD) 120 MG 24 hr capsule Take by mouth. 07/30/22   [provider]  HUMULIN R  500 UNIT/ML injection Inject into the skin. Use in insulin  pump 08/26/20   [provider]  Insulin  Human (INSULIN  PUMP) SOLN Inject into the skin.     [provider]  Multiple Vitamin (MULTIVITAMIN WITH MINERALS) TABS tablet Take 1 tablet by mouth daily.    [provider]  nebivolol  (BYSTOLIC ) 10 MG tablet Take 10 mg by mouth daily. 07/30/22   [provider]  OZEMPIC, 0.25 OR 0.5 MG/DOSE, 2 MG/3ML SOPN Inject into the skin. 07/23/22   [provider]  prasugrel  (EFFIENT ) 10 MG TABS tablet Take 10 mg by mouth daily. 01/04/17   [provider]  rosuvastatin  (CRESTOR ) 10 MG tablet Take 1 tablet (10 mg total) by mouth daily. 04/08/17   Tobie Press, MD  tamsulosin  (FLOMAX ) 0.4 MG CAPS capsule Take by mouth. 07/01/22   [provider]    Physical Exam   Triage Vital Signs: ED Triage Vitals [09/27/24 0038]  Encounter Vitals Group     BP (!) 182/88     Girls Systolic BP Percentile      Girls Diastolic BP Percentile  Boys Systolic BP Percentile      Boys Diastolic BP Percentile      Pulse Rate 70     Resp 20     Temp 97.9 F (36.6 C)     Temp Source Oral     SpO2 98 %     Weight      Height      Head Circumference      Peak Flow      Pain Score 9     Pain Loc      Pain Education      Exclude from Growth Chart     Most recent vital signs: Vitals:   09/27/24 0250 09/27/24 0445  BP: 138/67 135/70  Pulse: 67 68  Resp: 20 (!) 23  Temp: 97.7 F  (36.5 C)   SpO2: 95% 97%    CONSTITUTIONAL: Alert, responds appropriately to questions.  Appears uncomfortable HEAD: Normocephalic, atraumatic EYES: Conjunctivae clear, pupils appear equal, sclera nonicteric ENT: normal nose; moist mucous membranes NECK: Supple, normal ROM CARD: RRR; S1 and S2 appreciated RESP: Normal chest excursion without splinting or tachypnea; breath sounds clear and equal bilaterally; no wheezes, no rhonchi, no rales, no hypoxia or respiratory distress, speaking full sentences ABD/GI: Abdomen distended secondary to body habitus but no fluid wave or tympany.  Soft but diffusely tender throughout the upper abdomen and around the umbilicus.  He has an umbilical hernia that is reducible but tender to palpation with overlying redness, warmth that extends just around the umbilicus as well.  No fluctuance or drainage appreciated. BACK: The back appears normal EXT: Normal ROM in all joints; no deformity noted, no edema SKIN: Normal color for age and race; warm; no rash on exposed skin NEURO: Moves all extremities equally, normal speech PSYCH: The patient's mood and manner are appropriate.   ED Results / Procedures / Treatments   LABS: (all labs ordered are listed, but only abnormal results are displayed) Labs Reviewed  CBC - Abnormal; Notable for the following components:      Result Value   WBC 11.8 (*)    All other components within normal limits  URINALYSIS, ROUTINE W REFLEX MICROSCOPIC - Abnormal; Notable for the following components:   Color, Urine STRAW (*)    APPearance CLEAR (*)    Hgb urine dipstick SMALL (*)    All other components within normal limits  COMPREHENSIVE METABOLIC PANEL WITH GFR - Abnormal; Notable for the following components:   Glucose, Bld 182 (*)    All other components within normal limits  LIPASE, BLOOD - Abnormal; Notable for the following components:   Lipase >2,800 (*)    All other components within normal limits  TROPONIN T, HIGH  SENSITIVITY - Abnormal; Notable for the following components:   Troponin T High Sensitivity 20 (*)    All other components within normal limits  HIV ANTIBODY (ROUTINE TESTING W REFLEX)  HEMOGLOBIN A1C     EKG:  EKG Interpretation Date/Time:  Wednesday September 27 2024 00:40:44 EST Ventricular Rate:  71 PR Interval:  164 QRS Duration:  100 QT Interval:  390 QTC Calculation: 423 R Axis:   25  Text Interpretation: Normal sinus rhythm Incomplete right bundle branch block Borderline ECG When compared with ECG of 23-Nov-2020 01:46, PREVIOUS ECG IS PRESENT Confirmed by Neomi Neptune [phone]) on 09/27/2024 2:37:52 AM         RADIOLOGY: My personal review and interpretation of imaging: CT scan shows no acute abnormality.  No bowel obstruction.  No abscess.  I have personally reviewed all radiology reports.   CT ABDOMEN PELVIS W CONTRAST Result Date: 09/27/2024 EXAM: CT ABDOMEN AND PELVIS WITH CONTRAST 09/27/2024 03:20:40 AM TECHNIQUE: CT of the abdomen and pelvis was performed with the administration of intravenous contrast. Multiplanar reformatted images are provided for review. Automated exposure control, iterative reconstruction, and/or weight-based adjustment of the mA/kV was utilized to reduce the radiation dose to as low as reasonably achievable. COMPARISON: 08/04/2022. CLINICAL HISTORY: Generalized abdominal pain worse at the umbilicus, history of umbilical hernia, overlying skin changes, concern for incarcerated hernia versus abscess/cellulitis. FINDINGS: LOWER CHEST: Calcifications in the visualized right coronary artery. LIVER: The liver is unremarkable. GALLBLADDER AND BILE DUCTS: Gallbladder is unremarkable. No biliary ductal dilatation. SPLEEN: No acute abnormality. PANCREAS: No acute abnormality. ADRENAL GLANDS: No acute abnormality. KIDNEYS, URETERS AND BLADDER: Punctate nonobstructing stones in the right kidney. 2.2 cm simple appearing left mid pole renal cyst. Per consensus, no  follow-up is needed for simple Bosniak type 1 and 2 renal cysts, unless the patient has a malignancy history or risk factors. No stones in the ureters. No hydronephrosis. No perinephric or periureteral stranding. Urinary bladder is unremarkable. GI AND BOWEL: Gaseous distention of the stomach. Left colonic diverticulosis. No active diverticulitis. There is no bowel obstruction. PERITONEUM AND RETROPERITONEUM: No ascites. No free air. VASCULATURE: Aorta is normal in caliber. aortic atherosclerosis. LYMPH NODES: No lymphadenopathy. REPRODUCTIVE ORGANS: No acute abnormality. BONES AND SOFT TISSUES: Small to moderate sized umbilical hernia containing fat, stable since prior study. No acute osseous abnormality. No focal soft tissue abnormality. IMPRESSION: 1. Small to moderate sized fat-containing umbilical hernia, stable since prior study, without acute complication. 2. No acute abdominopelvic abnormality. 3. Coronary artery disease, aortic atherosclerosis. 4. Gaseous distention of the stomach. 5. Left colonic diverticulosis. Electronically signed by: Franky Crease MD 09/27/2024 03:42 AM EST RP Workstation: [id number]     PROCEDURES:  Critical Care performed: No   Procedures    IMPRESSION / MDM / ASSESSMENT AND PLAN / ED COURSE  I reviewed the triage vital signs and the nursing notes.    Patient here with complaints of abdominal pain.    DIFFERENTIAL DIAGNOSIS (includes but not limited to):   Abdominal wall cellulitis, omphalitis, abscess, gastritis, GERD, pancreatitis, bowel obstruction, incarcerated/strangulated hernia   Patient's presentation is most consistent with acute presentation with potential threat to life or bodily function.   PLAN: Will obtain labs, urine, CT of the abdomen pelvis.  Will give IV fluids, pain and nausea medicine.   MEDICATIONS GIVEN IN ED: Medications  0.9 %  sodium chloride  infusion ( Intravenous New Bag/Given 09/27/24 0437)  cefTRIAXone  (ROCEPHIN ) 2 g in  sodium chloride  0.9 % 100 mL IVPB (2 g Intravenous New Bag/Given 09/27/24 0438)  vancomycin  (VANCOREADY) IVPB 2000 mg/400 mL (has no administration in time range)  enoxaparin  (LOVENOX ) injection 40 mg (has no administration in time range)  acetaminophen  (TYLENOL ) tablet 650 mg (has no administration in time range)    Or  acetaminophen  (TYLENOL ) suppository 650 mg (has no administration in time range)  ondansetron  (ZOFRAN ) tablet 4 mg (has no administration in time range)    Or  ondansetron  (ZOFRAN ) injection 4 mg (has no administration in time range)  insulin  aspart (novoLOG ) injection 0-15 Units (has no administration in time range)  insulin  aspart (novoLOG ) injection 0-5 Units (has no administration in time range)  HYDROcodone -acetaminophen  (NORCO/VICODIN) 5-325 MG per tablet 1-2 tablet (has no administration in time range)  morphine  (PF) 2 MG/ML injection 2  mg (has no administration in time range)  albuterol  (PROVENTIL ) (2.5 MG/3ML) 0.083% nebulizer solution 2.5 mg (has no administration in time range)  morphine  (PF) 4 MG/ML injection 4 mg (4 mg Intravenous Given 09/27/24 0108)  ondansetron  (ZOFRAN ) injection 4 mg (4 mg Intravenous Given 09/27/24 0108)  ondansetron  (ZOFRAN ) injection 4 mg (4 mg Intravenous Given 09/27/24 0241)  morphine  (PF) 4 MG/ML injection 4 mg (4 mg Intravenous Given 09/27/24 0241)  sodium chloride  0.9 % bolus 500 mL (0 mLs Intravenous Stopped 09/27/24 0312)  iohexol  (OMNIPAQUE ) 350 MG/ML injection 100 mL (125 mLs Intravenous Contrast Given 09/27/24 0309)     ED COURSE: Patient's labs show a leukocytosis of 11.8.  Normal LFTs but lipase is greater than 2800.  Urine shows no sign of infection.  CT scan reviewed and interpreted by myself and the radiologist and shows small to moderate size fat-containing umbilical hernia that is stable without any abscess, bowel obstruction.  Clinically he does have signs of omphalitis on exam so we will start broad-spectrum antibiotics.  No  pancreatitis seen on CT imaging but given his distribution of pain and lipase greater than 2800 I suspect that he clinically does have pancreatitis.  Pain improving with morphine .  He is agreeable to admission for IV fluids, bowel rest, antibiotics, pain control.  He denies ever having pancreatitis before.  Does report his father had pancreatic cancer.  He does not drink alcohol regularly.  He does have diabetes and is on Mounjaro.  No known history of gallstones.   CONSULTS: Consulted and discussed patient's case with hospitalist, Dr. Cleatus.  I have recommended admission and consulting physician agrees and will place admission orders.  Patient (and family if present) agree with this plan.   I reviewed all nursing notes, vitals, pertinent previous records.  All labs, EKGs, imaging ordered have been independently reviewed and interpreted by myself.     OUTSIDE RECORDS REVIEWED: Reviewed most recent endocrinology note on 08/31/2024       FINAL CLINICAL IMPRESSION(S) / ED DIAGNOSES   Final diagnoses:  Idiopathic acute pancreatitis without infection or necrosis  Umbilical hernia without obstruction and without gangrene  Omphalitis in adult     Rx / DC Orders   ED Discharge Orders     None        Note:  This document was prepared using Dragon voice recognition software and may include unintentional dictation errors.   Ethelyne Erich, Josette SAILOR, DO 09/27/24 0501  "

## 2024-09-28 ENCOUNTER — Other Ambulatory Visit: Payer: Self-pay

## 2024-09-28 LAB — LIPASE, BLOOD: Lipase: 241 U/L — ABNORMAL HIGH (ref 11–51)

## 2024-09-28 LAB — COMPREHENSIVE METABOLIC PANEL WITH GFR
ALT: 16 U/L (ref 0–44)
AST: 20 U/L (ref 15–41)
Albumin: 3.5 g/dL (ref 3.5–5.0)
Alkaline Phosphatase: 87 U/L (ref 38–126)
Anion gap: 10 (ref 5–15)
BUN: 10 mg/dL (ref 8–23)
CO2: 25 mmol/L (ref 22–32)
Calcium: 9 mg/dL (ref 8.9–10.3)
Chloride: 103 mmol/L (ref 98–111)
Creatinine, Ser: 0.83 mg/dL (ref 0.61–1.24)
GFR, Estimated: >60 mL/min
Glucose, Bld: 183 mg/dL — ABNORMAL HIGH (ref 70–99)
Potassium: 3.9 mmol/L (ref 3.5–5.1)
Sodium: 138 mmol/L (ref 135–145)
Total Bilirubin: 0.6 mg/dL (ref 0.0–1.2)
Total Protein: 6.3 g/dL — ABNORMAL LOW (ref 6.5–8.1)

## 2024-09-28 LAB — CBC
HCT: 39.8 % (ref 39.0–52.0)
Hemoglobin: 13.4 g/dL (ref 13.0–17.0)
MCH: 31.1 pg (ref 26.0–34.0)
MCHC: 33.7 g/dL (ref 30.0–36.0)
MCV: 92.3 fL (ref 80.0–100.0)
Platelets: 149 K/uL — ABNORMAL LOW (ref 150–400)
RBC: 4.31 MIL/uL (ref 4.22–5.81)
RDW: 12.8 % (ref 11.5–15.5)
WBC: 10.6 K/uL — ABNORMAL HIGH (ref 4.0–10.5)
nRBC: 0 % (ref 0.0–0.2)

## 2024-09-28 LAB — GLUCOSE, CAPILLARY
Glucose-Capillary: 168 mg/dL — ABNORMAL HIGH (ref 70–99)
Glucose-Capillary: 209 mg/dL — ABNORMAL HIGH (ref 70–99)
Glucose-Capillary: 185 mg/dL — ABNORMAL HIGH (ref 70–99)

## 2024-09-28 MED ORDER — CEPHALEXIN 250 MG PO CAPS
250.0000 mg | ORAL_CAPSULE | Freq: Two times a day (BID) | ORAL | 0 refills | Status: AC
  Filled 2024-09-28: qty 10, 5d supply, fill #0

## 2024-09-28 MED ORDER — ONDANSETRON HCL 4 MG PO TABS
4.0000 mg | ORAL_TABLET | Freq: Four times a day (QID) | ORAL | 0 refills | Status: AC | PRN
  Filled 2024-09-28: qty 20, 5d supply, fill #0

## 2024-09-28 MED ORDER — CEPHALEXIN 250 MG PO CAPS
250.0000 mg | ORAL_CAPSULE | Freq: Two times a day (BID) | ORAL | Status: DC
  Administered 2024-09-28: 250 mg via ORAL
  Filled 2024-09-28: qty 1

## 2024-09-28 NOTE — Inpatient Diabetes Management (Signed)
 Inpatient Diabetes Program Recommendations  AACE/ADA: New Consensus Statement on Inpatient Glycemic Control   Target Ranges:  Prepandial:   less than 140 mg/dL      Peak postprandial:   less than 180 mg/dL (1-2 hours)      Critically ill patients:  140 - 180 mg/dL    Latest Reference Range & Units 09/27/24 08:21 09/27/24 12:59 09/27/24 16:09 09/27/24 20:56 09/28/24 04:00  Glucose-Capillary 70 - 99 mg/dL 871 (H) 871 (H) 868 (H) 169 (H) 168 (H)    Latest Reference Range & Units 09/27/24 01:43 09/28/24 04:50  Lipase 11 - 51 U/L >2,800 (H) >2,800 (H) 241 (H)    Latest Reference Range & Units 09/27/24 04:28  Hemoglobin A1C 4.8 - 5.6 % 6.0 (H)   Review of Glycemic Control  Diabetes history: DM2 Outpatient Diabetes medications: T-Slim insulin  pump with Humulin R  U500 insulin , Dexcom G7, Mounjaro 15 mg Qweek Current orders for Inpatient glycemic control: Novolog  0-15 units TID with meals, Novolog  0-5 units QHS  Inpatient Diabetes Program Recommendations:    Insulin : Based on glucose trends since arrival to hospital, would not resume insulin  pump until discharged home. CBGs ranged from 128-169 on 09/27/24 and 168 mg/dl this morning. Agree with current insulin  orders.  Outpatient DM: Due to pancreatitis, may want to discontinue Mounjaro as an outpatient and have patient follow up with Endocrinology regarding DM control.  Thanks, Earnie Gainer, RN, MSN, CDCES Diabetes Coordinator Inpatient Diabetes Program [phone] (Team Pager from 8am to 5pm)

## 2024-09-28 NOTE — TOC CM/SW Note (Signed)
 Transition of Care [facility]) - Inpatient Brief Assessment   Patient Details  Name: MUSA REWERTS MRN: [id number] Date of Birth: 1961-05-31  Transition of Care [facility]) CM/SW Contact:    Daved JONETTA Hamilton, RN Phone Number: 09/28/2024, 9:27 AM   Clinical Narrative:   Transition of Care [facility]) Screening Note   Patient Details  Name: TAVIAN CALLANDER Date of Birth: 1961-07-12   Transition of Care [facility]) CM/SW Contact:    Daved JONETTA Hamilton, RN Phone Number: 09/28/2024, 9:27 AM    Transition of Care Department [facility]) has reviewed patient and no TOC needs have been identified at this time. If new patient transition needs arise, please place a TOC consult.    Transition of Care Asessment: Insurance and Status: Insurance coverage has been reviewed Patient has primary care physician: Yes   Prior level of function:: Independent Prior/Current Home Services: No current home services Social Drivers of Health Review: SDOH reviewed no interventions necessary Readmission risk has been reviewed: Yes Transition of care needs: no transition of care needs at this time

## 2024-09-28 NOTE — Discharge Summary (Signed)
 Physician Discharge Summary  Philip Cook FMW:[id number] DOB: 24-Nov-1960 DOA: 09/27/2024  PCP: Auston Reyes BIRCH, MD  Admit date: 09/27/2024 Discharge date: 09/28/2024  Admitted From: Home Disposition:  Home  Recommendations for Outpatient Follow-up:  Follow up with PCP in 1-2 weeks Follow-up with endocrinology as directed  Home Health: No Equipment/Devices: None  Discharge Condition: Stable CODE STATUS: Full Diet recommendation: Carb modified  Brief/Interim Summary:   64 y.o. male with medical history significant of atrial fibrillation on Eliquis , coronary artery disease on antiplatelet, congestive heart failure, hypertension, type 1 diabetes mellitus with insulin  pump, hyperlipidemia, obesity presents the ED with complaints of periumbilical abdominal pain with overlying skin changes with associated pain radiating to left and right flank.  No nausea vomiting or diarrhea.   CT imaging survey significant for a small fat-containing hiatal hernia.  Interestingly patient's lipase is greater than 28,000 with no CT evidence of acute pancreatitis.   On my evaluation patient is resting in bed.  He does appear slightly uncomfortable.  He is endorsing diffuse abdominal pain with radiation up into the epigastrium.  Belly nontender on examination.    Discharge Diagnoses:  Principal Problem:   Acute pancreatitis Active Problems:   Pancreatitis  Acute pancreatitis Etiology is unclear.  Patient is on Mounjaro which could be a precipitating factor.  Interestingly the patient's lipase is greater than 28,000 without any CT evidence of pancreatitis.  Triglycerides are 293 which is elevated but again not high suspicion as a primary cause of patient's pancreatitis. Plan: Lipase significantly reduced on 200 at time of discharge.  Questionable whether initial lipase was actually true or this was a laboratory error.  In any case patient is tolerating p.o. intake without nausea vomiting or abdominal  pain.  He is stable for discharge at this time.  I will recommend he pause his Mounjaro at time of discharge and discuss any potential restart with his primary care and his outpatient endocrinologist.   Discharge Instructions  Discharge Instructions     Increase activity slowly   Complete by: As directed       Allergies as of 09/28/2024       Reactions   Trulicity [dulaglutide] Nausea And Vomiting   Sulfa Antibiotics Rash        Medication List     PAUSE taking these medications    Mounjaro 15 MG/0.5ML Pen Wait to take this until your doctor or other care provider tells you to start again. Generic drug: tirzepatide Inject 15 mg into the skin once a week.       STOP taking these medications    Ozempic (0.25 or 0.5 MG/DOSE) 2 MG/3ML Sopn Generic drug: Semaglutide(0.25 or 0.5MG /DOS)   prasugrel  10 MG Tabs tablet Commonly known as: EFFIENT        TAKE these medications    aspirin  EC 81 MG tablet Take 81 mg by mouth every evening. Swallow whole.   cephALEXin  250 MG capsule Commonly known as: KEFLEX  Take 1 capsule (250 mg total) by mouth every 12 (twelve) hours for 5 days.   diltiazem  120 MG 24 hr capsule Commonly known as: CARDIZEM  CD Take 120 mg by mouth daily.   Eliquis  5 MG Tabs tablet Generic drug: apixaban  Take 5 mg by mouth 2 (two) times daily.   furosemide  20 MG tablet Commonly known as: LASIX  Take 20 mg by mouth 2 (two) times daily.   HUMULIN R  500 UNIT/ML injection Generic drug: insulin  regular human CONCENTRATED Inject into the skin. Use in insulin  pump  ibuprofen 200 MG tablet Commonly known as: ADVIL Take 400-800 mg by mouth 2 (two) times daily as needed for moderate pain (pain score 4-6) or mild pain (pain score 1-3).   insulin  pump Soln Inject into the skin.   multivitamin with minerals Tabs tablet Take 1 tablet by mouth daily.   nebivolol  10 MG tablet Commonly known as: BYSTOLIC  Take 10 mg by mouth daily.   nitroGLYCERIN  0.4  MG SL tablet Commonly known as: NITROSTAT  Place 0.4 mg under the tongue every 5 (five) minutes as needed for chest pain.   ondansetron  4 MG tablet Commonly known as: ZOFRAN  Take 1 tablet (4 mg total) by mouth every 6 (six) hours as needed for nausea.   phentermine  37.5 MG tablet Commonly known as: ADIPEX-P  Take 37.5 mg by mouth every morning.   rosuvastatin  10 MG tablet Commonly known as: CRESTOR  Take 1 tablet (10 mg total) by mouth daily.   tamsulosin  0.4 MG Caps capsule Commonly known as: FLOMAX  Take 0.8 mg by mouth daily.        Allergies[1]  Consultations: None   Procedures/Studies: CT ABDOMEN PELVIS W CONTRAST Result Date: 09/27/2024 EXAM: CT ABDOMEN AND PELVIS WITH CONTRAST 09/27/2024 03:20:40 AM TECHNIQUE: CT of the abdomen and pelvis was performed with the administration of intravenous contrast. Multiplanar reformatted images are provided for review. Automated exposure control, iterative reconstruction, and/or weight-based adjustment of the mA/kV was utilized to reduce the radiation dose to as low as reasonably achievable. COMPARISON: 08/04/2022. CLINICAL HISTORY: Generalized abdominal pain worse at the umbilicus, history of umbilical hernia, overlying skin changes, concern for incarcerated hernia versus abscess/cellulitis. FINDINGS: LOWER CHEST: Calcifications in the visualized right coronary artery. LIVER: The liver is unremarkable. GALLBLADDER AND BILE DUCTS: Gallbladder is unremarkable. No biliary ductal dilatation. SPLEEN: No acute abnormality. PANCREAS: No acute abnormality. ADRENAL GLANDS: No acute abnormality. KIDNEYS, URETERS AND BLADDER: Punctate nonobstructing stones in the right kidney. 2.2 cm simple appearing left mid pole renal cyst. Per consensus, no follow-up is needed for simple Bosniak type 1 and 2 renal cysts, unless the patient has a malignancy history or risk factors. No stones in the ureters. No hydronephrosis. No perinephric or periureteral stranding.  Urinary bladder is unremarkable. GI AND BOWEL: Gaseous distention of the stomach. Left colonic diverticulosis. No active diverticulitis. There is no bowel obstruction. PERITONEUM AND RETROPERITONEUM: No ascites. No free air. VASCULATURE: Aorta is normal in caliber. aortic atherosclerosis. LYMPH NODES: No lymphadenopathy. REPRODUCTIVE ORGANS: No acute abnormality. BONES AND SOFT TISSUES: Small to moderate sized umbilical hernia containing fat, stable since prior study. No acute osseous abnormality. No focal soft tissue abnormality. IMPRESSION: 1. Small to moderate sized fat-containing umbilical hernia, stable since prior study, without acute complication. 2. No acute abdominopelvic abnormality. 3. Coronary artery disease, aortic atherosclerosis. 4. Gaseous distention of the stomach. 5. Left colonic diverticulosis. Electronically signed by: Franky Crease MD 09/27/2024 03:42 AM EST RP Workstation: [id number]      Subjective: Seen and examined on the day of discharge.  Stable.  Abdominal pain resolved.  Able to tolerate p.o. intake.  Appropriate discharge home.  Discharge Exam: Vitals:   09/28/24 0404 09/28/24 0904  BP: 125/64 136/66  Pulse: 72 76  Resp: 18 16  Temp: 98.6 F (37 C) 97.9 F (36.6 C)  SpO2: 98% 98%   Vitals:   09/27/24 1516 09/27/24 1956 09/28/24 0404 09/28/24 0904  BP: 129/65 (!) 128/56 125/64 136/66  Pulse: 75 72 72 76  Resp: 18 18 18 16   Temp: (!) 97.4 F (  36.3 C) 98.3 F (36.8 C) 98.6 F (37 C) 97.9 F (36.6 C)  TempSrc:      SpO2: 94% 96% 98% 98%  Weight:      Height:        General: Pt is alert, awake, not in acute distress Cardiovascular: RRR, S1/S2 +, no rubs, no gallops Respiratory: CTA bilaterally, no wheezing, no rhonchi Abdominal: Soft, NT, ND, bowel sounds + Extremities: no edema, no cyanosis    The results of significant diagnostics from this hospitalization (including imaging, microbiology, ancillary and laboratory) are listed below for reference.      Microbiology: No results found for this or any previous visit (from the past 240 hours).   Labs: BNP (last 3 results) No results for input(s): BNP in the last 8760 hours. Basic Metabolic Panel: Recent Labs  Lab 09/27/24 0143 09/28/24 0450  NA 140 138  K 3.7 3.9  CL 103 103  CO2 26 25  GLUCOSE 182* 183*  BUN 14 10  CREATININE 0.91 0.83  CALCIUM  9.4 9.0   Liver Function Tests: Recent Labs  Lab 09/27/24 0143 09/28/24 0450  AST 27 20  ALT 24 16  ALKPHOS 87 87  BILITOT 0.4 0.6  PROT 7.1 6.3*  ALBUMIN 4.1 3.5   Recent Labs  Lab 09/27/24 0143 09/28/24 0450  LIPASE >2,800*  >2,800* 241*   No results for input(s): AMMONIA in the last 168 hours. CBC: Recent Labs  Lab 09/27/24 0049 09/28/24 0450  WBC 11.8* 10.6*  HGB 14.1 13.4  HCT 43.4 39.8  MCV 93.7 92.3  PLT 179 149*   Cardiac Enzymes: No results for input(s): CKTOTAL, CKMB, CKMBINDEX, TROPONINI in the last 168 hours. BNP: Invalid input(s): POCBNP CBG: Recent Labs  Lab 09/27/24 1609 09/27/24 2056 09/28/24 0400 09/28/24 0805 09/28/24 1241  GLUCAP 131* 169* 168* 209* 185*   D-Dimer No results for input(s): DDIMER in the last 72 hours. Hgb A1c Recent Labs    09/27/24 0428  HGBA1C 6.0*   Lipid Profile Recent Labs    09/27/24 0143  CHOL 110  HDL 26*  LDLCALC 25  TRIG 706*  CHOLHDL 4.2   Thyroid function studies No results for input(s): TSH, T4TOTAL, T3FREE, THYROIDAB in the last 72 hours.  Invalid input(s): FREET3 Anemia work up No results for input(s): VITAMINB12, FOLATE, FERRITIN, TIBC, IRON, RETICCTPCT in the last 72 hours. Urinalysis    Component Value Date/Time   COLORURINE STRAW (A) 09/27/2024 0143   APPEARANCEUR CLEAR (A) 09/27/2024 0143   APPEARANCEUR Cloudy (A) 09/25/2020 1336   LABSPEC 1.011 09/27/2024 0143   PHURINE 7.0 09/27/2024 0143   GLUCOSEU NEGATIVE 09/27/2024 0143   HGBUR SMALL (A) 09/27/2024 0143   BILIRUBINUR NEGATIVE  09/27/2024 0143   BILIRUBINUR Negative 09/25/2020 1336   KETONESUR NEGATIVE 09/27/2024 0143   PROTEINUR NEGATIVE 09/27/2024 0143   NITRITE NEGATIVE 09/27/2024 0143   LEUKOCYTESUR NEGATIVE 09/27/2024 0143   Sepsis Labs Recent Labs  Lab 09/27/24 0049 09/28/24 0450  WBC 11.8* 10.6*   Microbiology No results found for this or any previous visit (from the past 240 hours).   Time coordinating discharge: 40 minutes  SIGNED:   Calvin KATHEE Robson, MD  [facility] 09/28/2024, 1:01 PM Pager   If 7PM-7AM, please contact night-coverage     [1]  Allergies Allergen Reactions   Trulicity [Dulaglutide] Nausea And Vomiting   Sulfa Antibiotics Rash

## 2024-09-28 NOTE — Plan of Care (Signed)
